# Patient Record
Sex: Male | Born: 1950
Health system: Southern US, Community
[De-identification: ages and names within clinical notes are randomized; demographics above are authoritative.]

## PROBLEM LIST (undated history)

## (undated) DIAGNOSIS — B029 Zoster without complications: Secondary | ICD-10-CM

## (undated) DIAGNOSIS — H269 Unspecified cataract: Secondary | ICD-10-CM

## (undated) DIAGNOSIS — I10 Essential (primary) hypertension: Secondary | ICD-10-CM

## (undated) DIAGNOSIS — E049 Nontoxic goiter, unspecified: Secondary | ICD-10-CM

## (undated) DIAGNOSIS — T7840XA Allergy, unspecified, initial encounter: Secondary | ICD-10-CM

## (undated) DIAGNOSIS — A0839 Other viral enteritis: Secondary | ICD-10-CM

## (undated) DIAGNOSIS — M810 Age-related osteoporosis without current pathological fracture: Secondary | ICD-10-CM

## (undated) DIAGNOSIS — J45909 Unspecified asthma, uncomplicated: Secondary | ICD-10-CM

## (undated) DIAGNOSIS — E785 Hyperlipidemia, unspecified: Secondary | ICD-10-CM

## (undated) DIAGNOSIS — Z860100 Personal history of colon polyps, unspecified: Secondary | ICD-10-CM

## (undated) DIAGNOSIS — M1990 Unspecified osteoarthritis, unspecified site: Secondary | ICD-10-CM

## (undated) DIAGNOSIS — K5190 Ulcerative colitis, unspecified, without complications: Secondary | ICD-10-CM

## (undated) DIAGNOSIS — F32A Depression, unspecified: Secondary | ICD-10-CM

## (undated) DIAGNOSIS — K219 Gastro-esophageal reflux disease without esophagitis: Secondary | ICD-10-CM

## (undated) DIAGNOSIS — B20 Human immunodeficiency virus [HIV] disease: Secondary | ICD-10-CM

## (undated) DIAGNOSIS — Z21 Asymptomatic human immunodeficiency virus [HIV] infection status: Secondary | ICD-10-CM

## (undated) DIAGNOSIS — K648 Other hemorrhoids: Secondary | ICD-10-CM

## (undated) DIAGNOSIS — G4730 Sleep apnea, unspecified: Secondary | ICD-10-CM

## (undated) DIAGNOSIS — F329 Major depressive disorder, single episode, unspecified: Secondary | ICD-10-CM

## (undated) DIAGNOSIS — K5790 Diverticulosis of intestine, part unspecified, without perforation or abscess without bleeding: Secondary | ICD-10-CM

## (undated) DIAGNOSIS — E559 Vitamin D deficiency, unspecified: Secondary | ICD-10-CM

## (undated) DIAGNOSIS — M8580 Other specified disorders of bone density and structure, unspecified site: Secondary | ICD-10-CM

## (undated) DIAGNOSIS — N529 Male erectile dysfunction, unspecified: Secondary | ICD-10-CM

## (undated) DIAGNOSIS — B259 Cytomegaloviral disease, unspecified: Secondary | ICD-10-CM

## (undated) DIAGNOSIS — F419 Anxiety disorder, unspecified: Secondary | ICD-10-CM

## (undated) HISTORY — DX: Depression, unspecified: F32.A

## (undated) HISTORY — DX: Asymptomatic human immunodeficiency virus (hiv) infection status: Z21

## (undated) HISTORY — DX: Unspecified asthma, uncomplicated: J45.909

## (undated) HISTORY — DX: Gastro-esophageal reflux disease without esophagitis: K21.9

## (undated) HISTORY — DX: Unspecified cataract: H26.9

## (undated) HISTORY — DX: Other viral enteritis: A08.39

## (undated) HISTORY — DX: Personal history of colonic polyps: Z86.010

## (undated) HISTORY — DX: Human immunodeficiency virus (HIV) disease: B20

## (undated) HISTORY — PX: TONSILLECTOMY AND ADENOIDECTOMY: SUR1326

## (undated) HISTORY — DX: Hyperlipidemia, unspecified: E78.5

## (undated) HISTORY — DX: Zoster without complications: B02.9

## (undated) HISTORY — PX: VASECTOMY: SHX75

## (undated) HISTORY — DX: Anxiety disorder, unspecified: F41.9

## (undated) HISTORY — PX: HEMORRHOID SURGERY: SHX153

## (undated) HISTORY — DX: Personal history of colon polyps, unspecified: Z86.0100

## (undated) HISTORY — DX: Unspecified osteoarthritis, unspecified site: M19.90

## (undated) HISTORY — DX: Diverticulosis of intestine, part unspecified, without perforation or abscess without bleeding: K57.90

## (undated) HISTORY — DX: Ulcerative colitis, unspecified, without complications: K51.90

## (undated) HISTORY — DX: Other specified disorders of bone density and structure, unspecified site: M85.80

## (undated) HISTORY — DX: Allergy, unspecified, initial encounter: T78.40XA

## (undated) HISTORY — DX: Essential (primary) hypertension: I10

## (undated) HISTORY — DX: Other hemorrhoids: K64.8

## (undated) HISTORY — DX: Cytomegaloviral disease, unspecified: B25.9

## (undated) HISTORY — PX: COLONOSCOPY: SHX174

## (undated) HISTORY — DX: Vitamin D deficiency, unspecified: E55.9

## (undated) HISTORY — DX: Sleep apnea, unspecified: G47.30

## (undated) HISTORY — DX: Male erectile dysfunction, unspecified: N52.9

## (undated) HISTORY — PX: UPPER GASTROINTESTINAL ENDOSCOPY: SHX188

## (undated) HISTORY — PX: MOUTH SURGERY: SHX715

## (undated) HISTORY — PX: CARPAL TUNNEL RELEASE: SHX101

---
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
## 2017-10-12 HISTORY — PX: CATARACT EXTRACTION, BILATERAL: SHX1313

## 2019-02-01 ENCOUNTER — Other Ambulatory Visit: Payer: Medicare Other

## 2019-02-01 ENCOUNTER — Other Ambulatory Visit (HOSPITAL_COMMUNITY)
Admission: RE | Admit: 2019-02-01 | Discharge: 2019-02-01 | Disposition: A | Discharge status: Home / Self Care | Payer: Medicare Other | Source: Ambulatory Visit | Attending: Family | Admitting: Family

## 2019-02-01 ENCOUNTER — Other Ambulatory Visit: Payer: Self-pay

## 2019-02-01 DIAGNOSIS — Z79899 Other long term (current) drug therapy: Secondary | ICD-10-CM

## 2019-02-01 DIAGNOSIS — Z113 Encounter for screening for infections with a predominantly sexual mode of transmission: Secondary | ICD-10-CM

## 2019-02-01 DIAGNOSIS — B20 Human immunodeficiency virus [HIV] disease: Secondary | ICD-10-CM

## 2019-02-02 LAB — URINALYSIS
Bilirubin Urine: NEGATIVE
Glucose, UA: NEGATIVE
Hgb urine dipstick: NEGATIVE
Ketones, ur: NEGATIVE
Leukocytes,Ua: NEGATIVE
Nitrite: NEGATIVE
Protein, ur: NEGATIVE
Specific Gravity, Urine: 1.018 (ref 1.001–1.03)
pH: 6 (ref 5.0–8.0)

## 2019-02-02 LAB — URINE CYTOLOGY ANCILLARY ONLY
Chlamydia: NEGATIVE
Comment: NEGATIVE
Comment: NORMAL
Neisseria Gonorrhea: NEGATIVE

## 2019-02-02 LAB — T-HELPER CELL (CD4) - (RCID CLINIC ONLY)
CD4 % Helper T Cell: 38 % (ref 33–65)
CD4 T Cell Abs: 1108 /uL (ref 400–1790)

## 2019-02-08 LAB — COMPLETE METABOLIC PANEL WITH GFR
AG Ratio: 2.1 (calc) (ref 1.0–2.5)
ALT: 28 U/L (ref 9–46)
AST: 19 U/L (ref 10–35)
Albumin: 4.4 g/dL (ref 3.6–5.1)
Alkaline phosphatase (APISO): 68 U/L (ref 35–144)
BUN/Creatinine Ratio: 10 (calc) (ref 6–22)
BUN: 13 mg/dL (ref 7–25)
CO2: 31 mmol/L (ref 20–32)
Calcium: 10.6 mg/dL — ABNORMAL HIGH (ref 8.6–10.3)
Chloride: 99 mmol/L (ref 98–110)
Creat: 1.26 mg/dL — ABNORMAL HIGH (ref 0.70–1.25)
GFR, Est African American: 68 mL/min/{1.73_m2} (ref >=60)
GFR, Est Non African American: 59 mL/min/{1.73_m2} — ABNORMAL LOW (ref >=60)
Globulin: 2.1 g/dL (calc) (ref 1.9–3.7)
Glucose, Bld: 129 mg/dL — ABNORMAL HIGH (ref 65–99)
Potassium: 5.1 mmol/L (ref 3.5–5.3)
Sodium: 138 mmol/L (ref 135–146)
Total Bilirubin: 0.4 mg/dL (ref 0.2–1.2)
Total Protein: 6.5 g/dL (ref 6.1–8.1)

## 2019-02-08 LAB — CBC WITH DIFFERENTIAL/PLATELET
Absolute Monocytes: 736 cells/uL (ref 200–950)
Basophils Absolute: 32 cells/uL (ref 0–200)
Basophils Relative: 0.4 %
Eosinophils Absolute: 64 cells/uL (ref 15–500)
Eosinophils Relative: 0.8 %
HCT: 45.2 % (ref 38.5–50.0)
Hemoglobin: 15.5 g/dL (ref 13.2–17.1)
Lymphs Abs: 2952 cells/uL (ref 850–3900)
MCH: 33 pg (ref 27.0–33.0)
MCHC: 34.3 g/dL (ref 32.0–36.0)
MCV: 96.2 fL (ref 80.0–100.0)
MPV: 10.5 fL (ref 7.5–12.5)
Monocytes Relative: 9.2 %
Neutro Abs: 4216 cells/uL (ref 1500–7800)
Neutrophils Relative %: 52.7 %
Platelets: 303 10*3/uL (ref 140–400)
RBC: 4.7 10*6/uL (ref 4.20–5.80)
RDW: 13 % (ref 11.0–15.0)
Total Lymphocyte: 36.9 %
WBC: 8 10*3/uL (ref 3.8–10.8)

## 2019-02-08 LAB — HIV-1 RNA ULTRAQUANT REFLEX TO GENTYP+
HIV 1 RNA Quant: <20 copies/mL
HIV-1 RNA Quant, Log: <1.3 Log copies/mL

## 2019-02-08 LAB — QUANTIFERON-TB GOLD PLUS
Mitogen-NIL: >10 IU/mL
NIL: 0.07 IU/mL
QuantiFERON-TB Gold Plus: NEGATIVE
TB1-NIL: 0 IU/mL
TB2-NIL: 0 IU/mL

## 2019-02-08 LAB — HEPATITIS B SURFACE ANTIGEN: Hepatitis B Surface Ag: NONREACTIVE

## 2019-02-08 LAB — RPR: RPR Ser Ql: REACTIVE — AB

## 2019-02-08 LAB — HEPATITIS C ANTIBODY
Hepatitis C Ab: NONREACTIVE
SIGNAL TO CUT-OFF: 0.01 (ref <1.00)

## 2019-02-08 LAB — HEPATITIS A ANTIBODY, TOTAL: Hepatitis A AB,Total: REACTIVE — AB

## 2019-02-08 LAB — HIV-1/2 AB - DIFFERENTIATION
HIV-1 antibody: POSITIVE — AB
HIV-2 Ab: UNDETERMINED — AB

## 2019-02-08 LAB — LIPID PANEL
Cholesterol: 246 mg/dL — ABNORMAL HIGH (ref <200)
HDL: 46 mg/dL (ref >=40)
Non-HDL Cholesterol (Calc): 200 mg/dL (calc) — ABNORMAL HIGH (ref <130)
Total CHOL/HDL Ratio: 5.3 (calc) — ABNORMAL HIGH (ref <5.0)
Triglycerides: 565 mg/dL — ABNORMAL HIGH (ref <150)

## 2019-02-08 LAB — RPR TITER: RPR Titer: 1:1 {titer} — ABNORMAL HIGH

## 2019-02-08 LAB — HEPATITIS B CORE ANTIBODY, TOTAL: Hep B Core Total Ab: NONREACTIVE

## 2019-02-08 LAB — HIV ANTIBODY (ROUTINE TESTING W REFLEX): HIV 1&2 Ab, 4th Generation: REACTIVE — AB

## 2019-02-08 LAB — FLUORESCENT TREPONEMAL AB(FTA)-IGG-BLD: Fluorescent Treponemal ABS: NONREACTIVE

## 2019-02-08 LAB — HLA B*5701: HLA-B*5701 w/rflx HLA-B High: NEGATIVE

## 2019-02-08 LAB — HEPATITIS B SURFACE ANTIBODY,QUALITATIVE: Hep B S Ab: REACTIVE — AB

## 2019-02-09 ENCOUNTER — Encounter: Payer: Self-pay | Admitting: Family

## 2019-02-15 NOTE — Progress Notes (Signed)
Patient seen with NP

## 2019-02-16 ENCOUNTER — Ambulatory Visit (INDEPENDENT_AMBULATORY_CARE_PROVIDER_SITE_OTHER): Payer: Medicare Other | Admitting: Pharmacist

## 2019-02-16 ENCOUNTER — Ambulatory Visit (INDEPENDENT_AMBULATORY_CARE_PROVIDER_SITE_OTHER): Payer: Medicare Other | Admitting: Family

## 2019-02-16 ENCOUNTER — Encounter: Payer: Self-pay | Admitting: Family

## 2019-02-16 ENCOUNTER — Telehealth: Payer: Self-pay

## 2019-02-16 ENCOUNTER — Other Ambulatory Visit: Payer: Self-pay

## 2019-02-16 VITALS — BP 138/92 | HR 75 | Wt 233.0 lb

## 2019-02-16 DIAGNOSIS — M8580 Other specified disorders of bone density and structure, unspecified site: Secondary | ICD-10-CM | POA: Diagnosis not present

## 2019-02-16 DIAGNOSIS — Z0000 Encounter for general adult medical examination without abnormal findings: Secondary | ICD-10-CM | POA: Diagnosis not present

## 2019-02-16 DIAGNOSIS — B20 Human immunodeficiency virus [HIV] disease: Secondary | ICD-10-CM | POA: Diagnosis not present

## 2019-02-16 DIAGNOSIS — K5190 Ulcerative colitis, unspecified, without complications: Secondary | ICD-10-CM

## 2019-02-16 DIAGNOSIS — Z113 Encounter for screening for infections with a predominantly sexual mode of transmission: Secondary | ICD-10-CM | POA: Diagnosis not present

## 2019-02-16 MED ORDER — FENOFIBRATE MICRONIZED 134 MG PO CAPS: 134.0000 mg | ORAL_CAPSULE | Freq: Every day | ORAL | 1 refills | Status: DC

## 2019-02-16 MED ORDER — GENVOYA 150-150-200-10 MG PO TABS: 1.0000 | ORAL_TABLET | Freq: Every day | ORAL | 5 refills | Status: DC

## 2019-02-16 MED ORDER — ESCITALOPRAM OXALATE 20 MG PO TABS: 20.0000 mg | ORAL_TABLET | Freq: Every day | ORAL | 1 refills | Status: DC

## 2019-02-16 MED ORDER — VITAMIN D (ERGOCALCIFEROL) 1.25 MG (50000 UNIT) PO CAPS: 50000.0000 [IU] | ORAL_CAPSULE | ORAL | 3 refills | Status: DC

## 2019-02-16 NOTE — Progress Notes (Signed)
Subjective:    Patient ID: Robert Park, male    DOB: [birth year], 69 y.o.   MRN: [id number]  Chief Complaint  Patient presents with  . New Patient (Initial Visit)    HPI:  Robert Park is a 69 y.o. male with previous medical history of HIV disease, osteopenia, anxiety, and shingles presenting today to transfer/establish care for HIV disease.   Robert Park is transferring care from his previous provider in [place], [place] where he was last seen on 07/28/2018 with good adherence and tolerance to his ART regimen of Genvoya with a viral load that was undetectable and CD4 count of 1334.  Believes his initial CD4 nadir was 450 with viral load of 21,000. Robert Park was initially diagnosed with HIV disease in February 2010 when he was started on a Atripla and then changed to [facility] in March 2016.  He has history of cytomegalovirus disease colitis and 2011 with no other opportunistic infections.  Initial clinic blood work completed on 02/01/2019 with confirmatory HIV-1 disease.  Viral load was undetectable and CD4 count was 1108.  He is serologically immune to hepatitis B and hepatitis A.  No acute infection with hepatitis C.  Gonorrhea, chlamydia, and syphilis were negative. HLAB-[id number] and QuantiFERON gold were negative.  Creatinine noted to be 1.26 with previous of 1.03.  Cholesterol panel with triglycerides of 565, HDL of 46, and LDL unable to be calculated due to triglyceride levels.  Robert Park continues to take his Genvoya as prescribed with no adverse side effects or missed doses since his last office visit.  Overall feeling well today with no new concerns/complaints.  He is needing to establish care with internal medicine and gastroenterology. Denies fevers, chills, night sweats, headaches, changes in vision, neck pain/stiffness, nausea, diarrhea, vomiting, lesions or rashes.  Robert Park has no problems obtaining his medication from the pharmacy and is covered through [place] healthcare Medicare and is  applying for SPAP.  Denies any feelings of being down, depressed, or hopeless.  He does use marijuana on occasion.  No current alcohol consumption or tobacco use.   Allergies  Allergen Reactions  . Prednisone   . Statins     Muscle pains, GI symptoms  . Sulfur       Outpatient Medications Prior to Visit  Medication Sig Dispense Refill  . Adalimumab (HUMIRA) 40 MG/0.4ML PSKT Inject into the skin.    Marland Kitchen ezetimibe (ZETIA) 10 MG tablet Take 10 mg by mouth daily.    Marland Kitchen losartan (COZAAR) 50 MG tablet Take 50 mg by mouth daily.    Marland Kitchen elvitegravir-cobicistat-emtricitabine-tenofovir (GENVOYA) 150-150-200-10 MG TABS tablet Take 1 tablet by mouth daily with breakfast.    . escitalopram (LEXAPRO) 20 MG tablet Take 20 mg by mouth daily.    . fenofibrate micronized (LOFIBRA) 134 MG capsule Take 134 mg by mouth daily before breakfast.     No facility-administered medications prior to visit.     Past Medical History:  Diagnosis Date  . Anxiety   . 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 HIV infection ([place])   . Hyperlipidemia   . Osteopenia   . Shingles     History reviewed. No pertinent surgical history.    History reviewed. No pertinent family history.    Social History   Socioeconomic History  . Marital status: Single    Spouse name: Not on file  . Number of children: Not on file  . Years of education: Not on file  . Highest education level: Not on  file  Occupational History  . Not on file  Tobacco Use  . Smoking status: Former Research scientist (life sciences)  . Smokeless tobacco: Never Used  . Tobacco comment: quit 32 years ago  Substance and Sexual Activity  . Alcohol use: Yes    Comment: every other day  . Drug use: Yes    Types: Marijuana    Comment: occassional usage  . Sexual activity: Not on file  Other Topics Concern  . Not on file  Social History Narrative  . Not on file   Social Determinants of Health   Financial Resource Strain:   . Difficulty of Paying Living Expenses: Not on file  Food  Insecurity:   . Worried About Charity fundraiser in the Last Year: Not on file  . Ran Out of Food in the Last Year: Not on file  Transportation Needs:   . Lack of Transportation (Medical): Not on file  . Lack of Transportation (Non-Medical): Not on file  Physical Activity:   . Days of Exercise per Week: Not on file  . Minutes of Exercise per Session: Not on file  Stress:   . Feeling of Stress : Not on file  Social Connections:   . Frequency of Communication with Friends and Family: Not on file  . Frequency of Social Gatherings with Friends and Family: Not on file  . Attends Religious Services: Not on file  . Active Member of Clubs or Organizations: Not on file  . Attends Archivist Meetings: Not on file  . Marital Status: Not on file  Intimate Partner Violence:   . Fear of Current or Ex-Partner: Not on file  . Emotionally Abused: Not on file  . Physically Abused: Not on file  . Sexually Abused: Not on file      Review of Systems  Constitutional: Negative for appetite change, chills, fatigue, fever and unexpected weight change.  Eyes: Negative for visual disturbance.  Respiratory: Negative for cough, chest tightness, shortness of breath and wheezing.   Cardiovascular: Negative for chest pain and leg swelling.  Gastrointestinal: Negative for abdominal pain, constipation, diarrhea, nausea and vomiting.  Genitourinary: Negative for dysuria, flank pain, frequency, genital sores, hematuria and urgency.  Skin: Negative for rash.  Allergic/Immunologic: Negative for immunocompromised state.  Neurological: Negative for dizziness and headaches.       Objective:    BP (!) 138/92   Pulse 75   Wt 233 lb (105.7 kg)  Nursing note and vital signs reviewed.  Physical Exam Constitutional:      General: He is not in acute distress.    Appearance: He is well-developed.  Eyes:     Conjunctiva/sclera: Conjunctivae normal.  Cardiovascular:     Rate and Rhythm: Normal rate and  regular rhythm.     Heart sounds: Normal heart sounds. No murmur. No friction rub. No gallop.   Pulmonary:     Effort: Pulmonary effort is normal. No respiratory distress.     Breath sounds: Normal breath sounds. No wheezing or rales.  Chest:     Chest wall: No tenderness.  Abdominal:     General: Bowel sounds are normal.     Palpations: Abdomen is soft.     Tenderness: There is no abdominal tenderness.  Musculoskeletal:     Cervical back: Neck supple.  Lymphadenopathy:     Cervical: No cervical adenopathy.  Skin:    General: Skin is warm and dry.     Findings: No rash.  Neurological:  Mental Status: He is alert and oriented to person, place, and time.  Psychiatric:        Behavior: Behavior normal.        Thought Content: Thought content normal.        Judgment: Judgment normal.         Assessment & Plan:   Patient Active Problem List   Diagnosis Date Noted  . Human immunodeficiency virus (HIV) disease ([place]) 02/16/2019  . Screening for STDs (sexually transmitted diseases) 02/16/2019  . Osteopenia 02/16/2019  . Healthcare maintenance 02/16/2019  . Ulcerative colitis without complications ([place]) 78/93/8101     Problem List Items Addressed This Visit      Digestive   Ulcerative colitis without complications ([place])    Currently maintained on Humira with no signs/symptoms of exacerbation at present.  Refer to gastroenterology for further management.      Relevant Orders   Ambulatory referral to Gastroenterology     Musculoskeletal and Integument   Osteopenia    Currently on vitamin D with no adverse side effects.  No recent fractures.  Check vitamin D levels with next blood work.  Continue current dose of vitamin D supplementation.      Relevant Orders   Vitamin D 1,25 dihydroxy     Other   Human immunodeficiency virus (HIV) disease ([place]) - Primary    Robert Park is a 69 y/o with HIV diagnosed in 2010 with initial CD4 nadir of 450 and viral load of 21,000. He  does have previous history of CMV colitis in 2010-11 with no other history of opportunistic infection.  Initially on a Atripla and switched to [place] in 2016.  We reviewed most recent lab work and discussed the plan of care with time for questions.  Continue current dose of Genvoya.  Plan for follow-up in 3 months or sooner if needed with lab work 1 to 2 weeks prior to appointment or on same day.      Relevant Medications   elvitegravir-cobicistat-emtricitabine-tenofovir (GENVOYA) 150-150-200-10 MG TABS tablet   Other Relevant Orders   T-helper cell (CD4)- ([facility] clinic only)   HIV-1 RNA quant-no reflex-bld   CBC   Comprehensive metabolic panel   Vitamin D 1,25 dihydroxy   Screening for STDs (sexually transmitted diseases)   Relevant Orders   RPR   Healthcare maintenance     Obtain vaccination records from previous provider.  Discussed importance of safe sexual practice to reduce risk of STI.          I have changed Robert Park's fenofibrate micronized and escitalopram. I am also having him start on Vitamin D (Ergocalciferol). Additionally, I am having him maintain his Humira, losartan, ezetimibe, and Genvoya.   Meds ordered this encounter  Medications  . elvitegravir-cobicistat-emtricitabine-tenofovir (GENVOYA) 150-150-200-10 MG TABS tablet    Sig: Take 1 tablet by mouth daily with breakfast.    Dispense:  30 tablet    Refill:  5    Order Specific Question:   Supervising Provider    Answer:   Carlyle Basques [[id number]]  . fenofibrate micronized (LOFIBRA) 134 MG capsule    Sig: Take 1 capsule (134 mg total) by mouth daily before breakfast.    Dispense:  30 capsule    Refill:  1    Order Specific Question:   Supervising Provider    Answer:   Carlyle Basques [[id number]]  . escitalopram (LEXAPRO) 20 MG tablet    Sig: Take 1 tablet (20 mg total) by mouth daily.  Dispense:  30 tablet    Refill:  1    Order Specific Question:   Supervising Provider    Answer:   Carlyle Basques  [[id number]]  . Vitamin D, Ergocalciferol, (DRISDOL) 1.25 MG (50000 UNIT) CAPS capsule    Sig: Take 1 capsule (50,000 Units total) by mouth every 7 (seven) days.    Dispense:  5 capsule    Refill:  3    Order Specific Question:   Supervising Provider    Answer:   Carlyle Basques [[id number]]     Follow-up: Return in about 3 months (around 05/16/2019), or if symptoms worsen or fail to improve.    Terri Piedra, MSN, FNP-C Nurse Practitioner [facility] for Infectious Disease [place] number: [phone]

## 2019-02-16 NOTE — Assessment & Plan Note (Signed)
Currently on vitamin D with no adverse side effects.  No recent fractures.  Check vitamin D levels with next blood work.  Continue current dose of vitamin D supplementation.

## 2019-02-16 NOTE — Assessment & Plan Note (Signed)
   Obtain vaccination records from previous provider.  Discussed importance of safe sexual practice to reduce risk of STI.

## 2019-02-16 NOTE — Assessment & Plan Note (Signed)
Currently maintained on Humira with no signs/symptoms of exacerbation at present.  Refer to gastroenterology for further management.

## 2019-02-16 NOTE — Telephone Encounter (Signed)
[facility] [facility] for patient's immunization records which were not provided in patient's transfer paperwork. Left voicemail requesting a call back for patient information.   Amela Handley Lorita Officer, RN

## 2019-02-16 NOTE — Patient Instructions (Signed)
Nice to meet you.  Continue to take your Genvoya as prescribed daily.  Refills will be sent to the pharmacy.  Referrals will be sent to gastroenterology.   Plan for follow up in 3 months or sooner if needed with lab work 1-2 weeks prior to appointment.  Have a great day and stay safe!

## 2019-02-16 NOTE — Assessment & Plan Note (Addendum)
Mr. Witherington is a 69 y/o with HIV diagnosed in 2010 with initial CD4 nadir of 450 and viral load of 21,000. He does have previous history of CMV colitis in 2010-11 with no other history of opportunistic infection.  Initially on a Atripla and switched to [place] in 2016.  We reviewed most recent lab work and discussed the plan of care with time for questions.  Continue current dose of Genvoya.  Plan for follow-up in 3 months or sooner if needed with lab work 1 to 2 weeks prior to appointment or on same day.

## 2019-02-19 NOTE — Progress Notes (Signed)
Introduced myself to patient and the pharmacy services at the clinic.  He is doing well on Genvoya and has no issues or questions. His SPAP is approved for help with his copay. Gave him my card and told him to call me with any issues/questions/concerns.

## 2019-02-26 ENCOUNTER — Ambulatory Visit: Payer: Medicare Other | Attending: Internal Medicine

## 2019-02-26 DIAGNOSIS — Z23 Encounter for immunization: Secondary | ICD-10-CM | POA: Insufficient documentation

## 2019-02-26 NOTE — Progress Notes (Signed)
   Covid-19 Vaccination Clinic  Name:  Robert Park    MRN: [id number] DOB: 22-Nov-1950  02/26/2019  [name] was observed post Covid-19 immunization for 15 minutes without incidence. He was provided with Vaccine Information Sheet and instruction to access the V-Safe system.   [name] was instructed to call 911 with any severe reactions post vaccine: Marland Kitchen Difficulty breathing  . Swelling of your face and throat  . A fast heartbeat  . A bad rash all over your body  . Dizziness and weakness    Immunizations Administered    Name Date Dose VIS Date Route   Pfizer COVID-19 Vaccine 02/26/2019  8:49 AM 0.3 mL 12/23/2018 Intramuscular   Manufacturer: [place]   Lot: [id number]   [place]: [id number]

## 2019-03-20 ENCOUNTER — Ambulatory Visit: Payer: Medicare Other | Attending: Internal Medicine

## 2019-03-20 DIAGNOSIS — Z23 Encounter for immunization: Secondary | ICD-10-CM | POA: Insufficient documentation

## 2019-03-20 NOTE — Progress Notes (Signed)
   Covid-19 Vaccination Clinic  Name:  Granite Godman    MRN: [id number] DOB: November 12, 1950  03/20/2019  Mr. Daft was observed post Covid-19 immunization for 15 minutes without incident. He was provided with Vaccine Information Sheet and instruction to access the V-Safe system.   Mr. Deiss was instructed to call 911 with any severe reactions post vaccine: Marland Kitchen Difficulty breathing  . Swelling of face and throat  . A fast heartbeat  . A bad rash all over body  . Dizziness and weakness   Immunizations Administered    Name Date Dose VIS Date Route   Pfizer COVID-19 Vaccine 03/20/2019  5:21 PM 0.3 mL 12/23/2018 Intramuscular   Manufacturer: [place]   Lot: [id number]   [place]: [id number]

## 2019-04-16 ENCOUNTER — Other Ambulatory Visit: Payer: Self-pay | Admitting: Family

## 2019-04-18 NOTE — Telephone Encounter (Signed)
Patient is requesting refills. He states he has not been able to establish primary care.   Please advise.   Laverle Patter, RN

## 2019-04-20 NOTE — Telephone Encounter (Signed)
Patient called requesting refills on medications. He stated he is not able to get in to see a PCP until next month. He requested refills for Losartan, escitalopram, ezetimibe and his fenofibrate. He has about 1 week left.   Imanol Bihl Lorita Officer, RN

## 2019-04-21 ENCOUNTER — Other Ambulatory Visit: Payer: Self-pay | Admitting: Family

## 2019-04-21 ENCOUNTER — Other Ambulatory Visit: Payer: Self-pay | Admitting: *Deleted

## 2019-04-21 ENCOUNTER — Telehealth: Payer: Self-pay | Admitting: *Deleted

## 2019-04-21 DIAGNOSIS — F32A Depression, unspecified: Secondary | ICD-10-CM

## 2019-04-21 DIAGNOSIS — F329 Major depressive disorder, single episode, unspecified: Secondary | ICD-10-CM

## 2019-04-21 DIAGNOSIS — E785 Hyperlipidemia, unspecified: Secondary | ICD-10-CM

## 2019-04-21 MED ORDER — EZETIMIBE 10 MG PO TABS: 10.0000 mg | ORAL_TABLET | Freq: Every day | ORAL | 0 refills | Status: DC

## 2019-04-21 MED ORDER — ESCITALOPRAM OXALATE 20 MG PO TABS: 20.0000 mg | ORAL_TABLET | Freq: Every day | ORAL | 0 refills | Status: DC

## 2019-04-21 MED ORDER — FENOFIBRATE MICRONIZED 134 MG PO CAPS: 134.0000 mg | ORAL_CAPSULE | Freq: Every day | ORAL | 0 refills | Status: DC

## 2019-04-21 MED ORDER — LOSARTAN POTASSIUM 50 MG PO TABS: 50.0000 mg | ORAL_TABLET | Freq: Every day | ORAL | 0 refills | Status: DC

## 2019-04-21 NOTE — Telephone Encounter (Signed)
Zetia and losartan not previously prescribed by you, listed as historic only.  OK to send in as written?

## 2019-04-21 NOTE — Telephone Encounter (Signed)
Ok to refill 30 day supply until he gets to his PCP.

## 2019-04-21 NOTE — Telephone Encounter (Signed)
Patient called requesting refills on medications. He stated he is not able to get in to see a PCP until next month. He requested refills for Losartan, escitalopram, ezetimibe and his fenofibrate. He has about 1 week left.   Dorie Lorita Officer, RN

## 2019-04-21 NOTE — Telephone Encounter (Signed)
Yes please - and thank you!

## 2019-04-23 ENCOUNTER — Other Ambulatory Visit: Payer: Self-pay | Admitting: Family

## 2019-05-16 ENCOUNTER — Other Ambulatory Visit: Payer: Medicare Other

## 2019-05-16 ENCOUNTER — Other Ambulatory Visit: Payer: Self-pay

## 2019-05-16 DIAGNOSIS — M8580 Other specified disorders of bone density and structure, unspecified site: Secondary | ICD-10-CM

## 2019-05-16 DIAGNOSIS — B20 Human immunodeficiency virus [HIV] disease: Secondary | ICD-10-CM

## 2019-05-16 DIAGNOSIS — Z113 Encounter for screening for infections with a predominantly sexual mode of transmission: Secondary | ICD-10-CM

## 2019-05-17 LAB — T-HELPER CELL (CD4) - (RCID CLINIC ONLY)
CD4 % Helper T Cell: 34 % (ref 33–65)
CD4 T Cell Abs: 1174 /uL (ref 400–1790)

## 2019-05-18 ENCOUNTER — Other Ambulatory Visit: Payer: Self-pay | Admitting: Family

## 2019-05-18 DIAGNOSIS — F329 Major depressive disorder, single episode, unspecified: Secondary | ICD-10-CM

## 2019-05-18 DIAGNOSIS — F32A Depression, unspecified: Secondary | ICD-10-CM

## 2019-05-18 DIAGNOSIS — E785 Hyperlipidemia, unspecified: Secondary | ICD-10-CM

## 2019-05-19 LAB — COMPREHENSIVE METABOLIC PANEL
AG Ratio: 2.1 (calc) (ref 1.0–2.5)
ALT: 17 U/L (ref 9–46)
AST: 14 U/L (ref 10–35)
Albumin: 4.5 g/dL (ref 3.6–5.1)
Alkaline phosphatase (APISO): 61 U/L (ref 35–144)
BUN: 16 mg/dL (ref 7–25)
CO2: 27 mmol/L (ref 20–32)
Calcium: 10 mg/dL (ref 8.6–10.3)
Chloride: 102 mmol/L (ref 98–110)
Creat: 0.97 mg/dL (ref 0.70–1.25)
Globulin: 2.1 g/dL (calc) (ref 1.9–3.7)
Glucose, Bld: 133 mg/dL — ABNORMAL HIGH (ref 65–99)
Potassium: 4.7 mmol/L (ref 3.5–5.3)
Sodium: 136 mmol/L (ref 135–146)
Total Bilirubin: 0.5 mg/dL (ref 0.2–1.2)
Total Protein: 6.6 g/dL (ref 6.1–8.1)

## 2019-05-19 LAB — RPR: RPR Ser Ql: NONREACTIVE

## 2019-05-19 LAB — VITAMIN D 1,25 DIHYDROXY
Vitamin D 1, 25 (OH)2 Total: 48 pg/mL (ref 18–72)
Vitamin D2 1, 25 (OH)2: 48 pg/mL
Vitamin D3 1, 25 (OH)2: <8 pg/mL

## 2019-05-19 LAB — CBC
HCT: 46 % (ref 38.5–50.0)
Hemoglobin: 15.7 g/dL (ref 13.2–17.1)
MCH: 32.6 pg (ref 27.0–33.0)
MCHC: 34.1 g/dL (ref 32.0–36.0)
MCV: 95.6 fL (ref 80.0–100.0)
MPV: 10.7 fL (ref 7.5–12.5)
Platelets: 269 10*3/uL (ref 140–400)
RBC: 4.81 10*6/uL (ref 4.20–5.80)
RDW: 13.1 % (ref 11.0–15.0)
WBC: 8.4 10*3/uL (ref 3.8–10.8)

## 2019-05-19 LAB — HIV-1 RNA QUANT-NO REFLEX-BLD
HIV 1 RNA Quant: <20 copies/mL
HIV-1 RNA Quant, Log: <1.3 Log copies/mL

## 2019-05-25 ENCOUNTER — Encounter: Payer: Self-pay | Admitting: Family

## 2019-05-25 ENCOUNTER — Telehealth: Payer: Self-pay

## 2019-05-25 NOTE — Telephone Encounter (Signed)
Recv'd reocrds from [facility] forwarded 21 pages to attn:Jessica @ [facility] 5/13/21fbg

## 2019-05-29 ENCOUNTER — Encounter: Payer: Self-pay | Admitting: Gastroenterology

## 2019-06-01 ENCOUNTER — Ambulatory Visit: Payer: Medicare Other | Admitting: Family

## 2019-06-01 ENCOUNTER — Other Ambulatory Visit: Payer: Self-pay

## 2019-06-01 ENCOUNTER — Telehealth: Payer: Self-pay | Admitting: Gastroenterology

## 2019-06-01 ENCOUNTER — Encounter: Payer: Self-pay | Admitting: Family

## 2019-06-01 VITALS — BP 133/89 | HR 72 | Temp 98.6°F | Wt 229.0 lb

## 2019-06-01 DIAGNOSIS — Z0000 Encounter for general adult medical examination without abnormal findings: Secondary | ICD-10-CM | POA: Diagnosis not present

## 2019-06-01 DIAGNOSIS — M8580 Other specified disorders of bone density and structure, unspecified site: Secondary | ICD-10-CM

## 2019-06-01 DIAGNOSIS — B20 Human immunodeficiency virus [HIV] disease: Secondary | ICD-10-CM | POA: Diagnosis not present

## 2019-06-01 MED ORDER — TADALAFIL 10 MG PO TABS
5.0000 mg | ORAL_TABLET | Freq: Every day | ORAL | 0 refills | Status: DC | PRN
Start: 2019-06-01 — End: 2019-09-01

## 2019-06-01 MED ORDER — GENVOYA 150-150-200-10 MG PO TABS: 1.0000 | ORAL_TABLET | Freq: Every day | ORAL | 5 refills | Status: DC

## 2019-06-01 NOTE — Telephone Encounter (Signed)
Hi Dr. Loletha Carrow,    D.O.D  We received a referral from PCP from patient to have a repeat colonoscopy. Pt stated that he has GI hx in [place] and has to have a colonoscopy every other year.  Pt reported that he has HIV and UC for the past 10 years.  Medical records requested and have been received will be sending them to you for review.     Please advise on scheduling. Thank you

## 2019-06-01 NOTE — Assessment & Plan Note (Signed)
Robert Park continues to have well-controlled HIV disease with good adherence and tolerance to his ART regimen of Genvoya.  No signs/symptoms of opportunistic infection or progressive HIV disease.  Reviewed his lab work and discussed the plan of care.  Continue current dose of Genvoya.  Plan for follow-up in 3 months or sooner if needed with lab work 1 to 2 weeks prior to appointment.

## 2019-06-01 NOTE — Assessment & Plan Note (Signed)
   Working to establish care with gastroenterology for colonoscopy.  Discussed importance of safe sexual practice to reduce risk of STI.  Obtain DEXA scan for osteopenia.

## 2019-06-01 NOTE — Telephone Encounter (Signed)
Please arrange a new patient office visit with me and I will review the records then. Thank you.  - HD

## 2019-06-01 NOTE — Progress Notes (Signed)
Subjective:    Patient ID: Robert Park, male    DOB: 02-27-1950, 69 y.o.   MRN: [id number]  Chief Complaint  Patient presents with  . Follow-up    B20     HPI:  Robert Park is a 69 y.o. male with HIV disease last seen in the office on 02/16/2019 with good adherence and tolerance to his ART regimen of Genvoya.  Viral load at the time was undetectable with CD4 count of 1108.  Most recent blood work completed on 05/16/2019 with a viral load that remains undetectable and CD4 count of 1174.  He is here today for routine follow-up.  Robert Park continues to take his Genvoya as prescribed with no adverse side effects or missed doses.  Overall feeling well today.  Working on establishing with gastroenterology for his ulcerative colitis as well as need for colonoscopy.  He is working on establishing with primary care and is in need of a DEXA scan for bone mineral density testing secondary to osteopenia. Denies fevers, chills, night sweats, headaches, changes in vision, neck pain/stiffness, nausea, diarrhea, vomiting, lesions or rashes.  Robert Park has no problems obtaining his medication from the pharmacy and remains covered [place] healthcare Medicare.  No feelings of being down, depressed, or hopeless recently.  Continues to use marijuana recreationally and drinks alcohol every other day.  Allergies  Allergen Reactions  . Prednisone   . Statins     Muscle pains, GI symptoms  . Sulfur       Outpatient Medications Prior to Visit  Medication Sig Dispense Refill  . Adalimumab (HUMIRA) 40 MG/0.4ML PSKT Inject into the skin.    Marland Kitchen escitalopram (LEXAPRO) 20 MG tablet TAKE 1 TABLET(20 MG) BY MOUTH DAILY 30 tablet 0  . ezetimibe (ZETIA) 10 MG tablet TAKE 1 TABLET(10 MG) BY MOUTH DAILY 30 tablet 0  . fenofibrate micronized (LOFIBRA) 134 MG capsule TAKE 1 CAPSULE(134 MG) BY MOUTH DAILY BEFORE BREAKFAST 30 capsule 0  . losartan (COZAAR) 50 MG tablet TAKE 1 TABLET(50 MG) BY MOUTH DAILY 30 tablet 0  .  Vitamin D, Ergocalciferol, (DRISDOL) 1.25 MG (50000 UNIT) CAPS capsule TAKE 1 CAPSULE BY MOUTH EVERY 7 DAYS 12 capsule 2  . elvitegravir-cobicistat-emtricitabine-tenofovir (GENVOYA) 150-150-200-10 MG TABS tablet Take 1 tablet by mouth daily with breakfast. 30 tablet 5   No facility-administered medications prior to visit.     Past Medical History:  Diagnosis Date  . Anxiety   . 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 HIV infection ([place])   . Hyperlipidemia   . Osteopenia   . Shingles      History reviewed. No pertinent surgical history.   Review of Systems  Constitutional: Negative for appetite change, chills, fatigue, fever and unexpected weight change.  Eyes: Negative for visual disturbance.  Respiratory: Negative for cough, chest tightness, shortness of breath and wheezing.   Cardiovascular: Negative for chest pain and leg swelling.  Gastrointestinal: Negative for abdominal pain, constipation, diarrhea, nausea and vomiting.  Genitourinary: Negative for dysuria, flank pain, frequency, genital sores, hematuria and urgency.  Skin: Negative for rash.  Allergic/Immunologic: Negative for immunocompromised state.  Neurological: Negative for dizziness and headaches.      Objective:    BP 133/89   Pulse 72   Temp 98.6 F (37 C)   Wt 229 lb (103.9 kg)   SpO2 94%  Nursing note and vital signs reviewed.  Physical Exam Constitutional:      General: He is not in acute distress.  Appearance: He is well-developed.  Eyes:     Conjunctiva/sclera: Conjunctivae normal.  Cardiovascular:     Rate and Rhythm: Normal rate and regular rhythm.     Heart sounds: Normal heart sounds. No murmur. No friction rub. No gallop.   Pulmonary:     Effort: Pulmonary effort is normal. No respiratory distress.     Breath sounds: Normal breath sounds. No wheezing or rales.  Chest:     Chest wall: No tenderness.  Abdominal:     General: Bowel sounds are normal.     Palpations: Abdomen is soft.     Tenderness:  There is no abdominal tenderness.  Musculoskeletal:     Cervical back: Neck supple.  Lymphadenopathy:     Cervical: No cervical adenopathy.  Skin:    General: Skin is warm and dry.     Findings: No rash.  Neurological:     Mental Status: He is alert and oriented to person, place, and time.  Psychiatric:        Behavior: Behavior normal.        Thought Content: Thought content normal.        Judgment: Judgment normal.      Depression screen PHQ 2/9 02/16/2019  Decreased Interest 0  Down, Depressed, Hopeless 0  PHQ - 2 Score 0       Assessment & Plan:    Patient Active Problem List   Diagnosis Date Noted  . Human immunodeficiency virus (HIV) disease ([place]) 02/16/2019  . Screening for STDs (sexually transmitted diseases) 02/16/2019  . Osteopenia 02/16/2019  . Healthcare maintenance 02/16/2019  . Ulcerative colitis without complications ([place]) 03/26/9456     Problem List Items Addressed This Visit      Musculoskeletal and Integument   Osteopenia   Relevant Orders   DG DXA FRACTURE ASSESSMENT     Other   Human immunodeficiency virus (HIV) disease ([place])    Robert Park continues to have well-controlled HIV disease with good adherence and tolerance to his ART regimen of Genvoya.  No signs/symptoms of opportunistic infection or progressive HIV disease.  Reviewed his lab work and discussed the plan of care.  Continue current dose of Genvoya.  Plan for follow-up in 3 months or sooner if needed with lab work 1 to 2 weeks prior to appointment.      Relevant Medications   elvitegravir-cobicistat-emtricitabine-tenofovir (GENVOYA) 150-150-200-10 MG TABS tablet   Other Relevant Orders   HIV-1 RNA quant-no reflex-bld   T-helper cell (CD4)- ([facility] clinic only)   Comprehensive metabolic panel   Healthcare maintenance     Working to establish care with gastroenterology for colonoscopy.  Discussed importance of safe sexual practice to reduce risk of STI.  Obtain DEXA scan for  osteopenia.       Other Visit Diagnoses    At risk for colon cancer    -  Primary       I am having Robert Park start on tadalafil. I am also having him maintain his Humira, fenofibrate micronized, ezetimibe, losartan, escitalopram, Vitamin D (Ergocalciferol), and Genvoya.   Meds ordered this encounter  Medications  . elvitegravir-cobicistat-emtricitabine-tenofovir (GENVOYA) 150-150-200-10 MG TABS tablet    Sig: Take 1 tablet by mouth daily with breakfast.    Dispense:  30 tablet    Refill:  5    Order Specific Question:   Supervising Provider    Answer:   Carlyle Basques [[id number]]  . tadalafil (CIALIS) 10 MG tablet    Sig: Take 0.5 tablets (5  mg total) by mouth daily as needed for erectile dysfunction.    Dispense:  10 tablet    Refill:  0    Order Specific Question:   Supervising Provider    Answer:   Carlyle Basques [[id number]]     Follow-up: Return in about 3 months (around 09/01/2019), or if symptoms worsen or fail to improve.   Terri Piedra, MSN, FNP-C Nurse Practitioner [facility] for Infectious Disease [place] number: [phone]

## 2019-06-01 NOTE — Patient Instructions (Signed)
Nice to see you.  We will continue your current dose of Genvoya.  Refills will be sent to the pharmacy.  A referral has been sent for your DEXA scan for bone mineral density testing.  Continue to work with getting records with GI.  We will plan to follow up in 3 months or sooner if needed with lab work 1-2 weeks prior to your appointment.  Renew SPAP after July 1st.  Have a great day and stay safe!

## 2019-06-05 NOTE — Telephone Encounter (Signed)
Consult scheduled for 5/25 at 3:00pm.

## 2019-06-06 ENCOUNTER — Ambulatory Visit: Payer: Medicare Other | Admitting: Gastroenterology

## 2019-06-06 VITALS — BP 124/80 | HR 76 | Ht 67.0 in | Wt 229.5 lb

## 2019-06-06 DIAGNOSIS — Z79899 Other long term (current) drug therapy: Secondary | ICD-10-CM

## 2019-06-06 DIAGNOSIS — K5130 Ulcerative (chronic) rectosigmoiditis without complications: Secondary | ICD-10-CM | POA: Diagnosis not present

## 2019-06-06 DIAGNOSIS — K635 Polyp of colon: Secondary | ICD-10-CM | POA: Insufficient documentation

## 2019-06-06 NOTE — Progress Notes (Addendum)
[facility] Consult Note:  History: Robert Park 06/06/2019  Referring provider: Terri Piedra, FNP  Reason for consult/chief complaint: Ulcerative Colitis (taking Humira, )   Subjective  HPI: We received a referral from this patient's primary care provider requesting a colonoscopy in the setting of history of ulcerative colitis.  Office visit was scheduled to evaluate patient and understand history and treatment of his colitis.  Office visit by Dr. Birdie Hopes on 07/16/2017 indicates ulcerative colitis diagnosed in 2009.  At the time of 2019 visit, patient reportedly feeling well on Humira 40 mg every other week.  At that point, reportedly up-to-date on flu, pneumococcal and meningitis vaccine.  Shingrix vaccine was recommended.  Colonoscopy June 2018 2 diminutive cecal polyps removed.  Sigmoid diverticulosis.  Mild erythematous and scarred mucosa in the rectum and distal sigmoid, additional biopsies taken remainder of colon.  Terminal ileum reportedly normal. Pathology showed polypoid low-grade dysplastic lesion (? Adenoma).  Biopsies around the polyp showed focal active inflammation with no dysplasia.  Biopsies from the ascending, transverse and descending were normal.  Biopsies from rectum and sigmoid colon with mild chronic colitis and focal activity. January 2019 creatinine 1.27, celiac serology negative. April 2019 creatinine 1.17, LFTs normal, hemoglobin 15.7, WBC 8, platelets 306.  June 2018 no indicates patient mainly had rectosigmoid colitis initially on Lialda, started Humira approximately April 2016.  Mother reportedly had colon cancer in her 72s.  _____________________________________  From 06/01/19 ID clinic note: "Robert Park is a 69 y.o. male with HIV disease last seen in the office on 02/16/2019 with good adherence and tolerance to his ART regimen of Genvoya.  Viral load at the time was undetectable with CD4 count of 1108.  Most recent blood work  completed on 05/16/2019 with a viral load that remains undetectable and CD4 count of 1174.  He is here today for routine follow-up. " ID provider ordered DXA scan.  ________________________________  Robert Park reports that his colitis has been under good control for years.  He denies chronic abdominal pain, diarrhea or rectal bleeding.  His appetite is good and weight stable.  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  He was due for a surveillance colonoscopy about a year ago but was unable to do so with a moved to this area and Covid.  He received care for several years in [place] and then more recently in [place], which are the records available to [place] and noted above. He denies painful eye redness, skin rash, red swollen joints or injection site reactions from Humira.  ROS:  Review of Systems  Constitutional: Negative for appetite change and unexpected weight change.  HENT: Negative for mouth sores and voice change.   Eyes: Negative for pain and redness.  Respiratory: Negative for cough and shortness of breath.   Cardiovascular: Negative for chest pain and palpitations.  Genitourinary: Negative for dysuria and hematuria.  Musculoskeletal: Negative for arthralgias and myalgias.  Skin: Negative for pallor and rash.  Neurological: Negative for weakness and headaches.  Hematological: Negative for adenopathy.  Psychiatric/Behavioral:       Mood stable     Past Medical History: Past Medical History:  Diagnosis Date  . Anxiety   . 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 Depression   . Diverticulosis   . Dyslipidemia   . ED (erectile dysfunction)   . History  of colon polyps   . HIV infection ([place])   . Hyperlipidemia   . Hypertension   . Internal hemorrhoids   . Nontoxic goiter   . Osteopenia   . Osteoporosis   . Shingles   . Ulcerative colitis ([place])    . Vitamin D deficiency      Past Surgical History: Past Surgical History:  Procedure Laterality Date  . CARPAL TUNNEL RELEASE Right    x 2  . CARPAL TUNNEL RELEASE Left    x 2  . CATARACT EXTRACTION, BILATERAL Bilateral 10/2017  . COLONOSCOPY    . HEMORRHOID SURGERY    . MOUTH SURGERY    . TONSILLECTOMY AND ADENOIDECTOMY     age 71  . VASECTOMY       Family History: Family History  Problem Relation Age of Onset  . Colon cancer Mother 68  . Heart failure Mother   . Cervical cancer Mother   . Leukemia Mother   . Diabetes Mother   . Lung cancer Father   . Diabetes Sister   . Diverticulitis Brother   . Diabetes Maternal Grandmother   . Diabetes Brother   . Hypertension Brother   . Hypertension Sister   . Post-traumatic stress disorder Son   . Hypertension Son   . Hyperlipidemia Son   . Anxiety disorder Son     Social History: Social History   Socioeconomic History  . Marital status: Single    Spouse name: Not on file  . Number of children: 2  . Years of education: Not on file  . Highest education level: Not on file  Occupational History  . Occupation: retired Financial planner  . Occupation: diabled  Tobacco Use  . Smoking status: Former Smoker    Types: Cigarettes    Quit date: 06/06/1987    Years since quitting: 32.0  . Smokeless tobacco: Never Used  . Tobacco comment: quit 32 years ago  Substance and Sexual Activity  . Alcohol use: Yes    Comment: every other day -social  . Drug use: Yes    Types: Marijuana    Comment: occassional usage  . Sexual activity: Not on file  Other Topics Concern  . Not on file  Social History Narrative  . Not on file   Social Determinants of Health   Financial Resource Strain:   . Difficulty of Paying Living Expenses:   Food Insecurity:   . Worried About Charity fundraiser in the Last Year:   . Arboriculturist in the Last Year:   Transportation Needs:   . Film/video editor (Medical):   Marland Kitchen Lack of  Transportation (Non-Medical):   Physical Activity:   . Days of Exercise per Week:   . Minutes of Exercise per Session:   Stress:   . Feeling of Stress :   Social Connections:   . Frequency of Communication with Friends and Family:   . Frequency of Social Gatherings with Friends and Family:   . Attends Religious Services:   . Active Member of Clubs or Organizations:   . Attends Archivist Meetings:   Marland Kitchen Marital Status:     Allergies: Allergies  Allergen Reactions  . Prednisone   . Statins     Muscle pains, GI symptoms  . Sulfur     Outpatient Meds: Current Outpatient Medications  Medication Sig Dispense Refill  . Adalimumab (HUMIRA) 40 MG/0.4ML PSKT Inject into the skin.    Marland Kitchen elvitegravir-cobicistat-emtricitabine-tenofovir (GENVOYA) 150-150-200-10 MG TABS  tablet Take 1 tablet by mouth daily with breakfast. 30 tablet 5  . escitalopram (LEXAPRO) 20 MG tablet TAKE 1 TABLET(20 MG) BY MOUTH DAILY 30 tablet 0  . ezetimibe (ZETIA) 10 MG tablet TAKE 1 TABLET(10 MG) BY MOUTH DAILY 30 tablet 0  . fenofibrate micronized (LOFIBRA) 134 MG capsule TAKE 1 CAPSULE(134 MG) BY MOUTH DAILY BEFORE BREAKFAST 30 capsule 0  . losartan (COZAAR) 50 MG tablet TAKE 1 TABLET(50 MG) BY MOUTH DAILY 30 tablet 0  . tadalafil (CIALIS) 10 MG tablet Take 0.5 tablets (5 mg total) by mouth daily as needed for erectile dysfunction. 10 tablet 0  . Vitamin D, Ergocalciferol, (DRISDOL) 1.25 MG (50000 UNIT) CAPS capsule TAKE 1 CAPSULE BY MOUTH EVERY 7 DAYS 12 capsule 2   No current facility-administered medications for this visit.      ___________________________________________________________________ Objective   Exam:  BP 124/80 (BP Location: Left Arm, Patient Position: Sitting, Cuff Size: Normal)   Pulse 76   Ht 5' 7"  (1.702 m) Comment: height measured without shoes  Wt 229 lb 8 oz (104.1 kg)   BMI 35.94 kg/m    General: Well-appearing pleasant and conversational  Eyes: sclera anicteric, no  redness  ENT: oral mucosa moist without lesions, no cervical or supraclavicular lymphadenopathy  CV: RRR without murmur, S1/S2, no JVD, no peripheral edema  Resp: clear to auscultation bilaterally, normal RR and effort noted  GI: soft, no tenderness, with active bowel sounds. No guarding or palpable organomegaly noted.  Skin; warm and dry, no rash or jaundice noted  Neuro: awake, alert and oriented x 3. Normal gross motor function and fluent speech  Labs:  negative TB quant gold 02/01/19  CBC Latest Ref Rng & Units 05/16/2019 02/01/2019  WBC 3.8 - 10.8 Thousand/uL 8.4 8.0  Hemoglobin 13.2 - 17.1 g/dL 15.7 15.5  Hematocrit 38.5 - 50.0 % 46.0 45.2  Platelets 140 - 400 Thousand/uL 269 303   CMP Latest Ref Rng & Units 05/16/2019 02/01/2019  Glucose 65 - 99 mg/dL 133(H) 129(H)  BUN 7 - 25 mg/dL 16 13  Creatinine 0.70 - 1.25 mg/dL 0.97 1.26(H)  Sodium 135 - 146 mmol/L 136 138  Potassium 3.5 - 5.3 mmol/L 4.7 5.1  Chloride 98 - 110 mmol/L 102 99  CO2 20 - 32 mmol/L 27 31  Calcium 8.6 - 10.3 mg/dL 10.0 10.6(H)  Total Protein 6.1 - 8.1 g/dL 6.6 6.5  Total Bilirubin 0.2 - 1.2 mg/dL 0.5 0.4  AST 10 - 35 U/L 14 19  ALT 9 - 46 U/L 17 28   Vitamin D low    Assessment: Encounter Diagnoses  Name Primary?  . 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 Long term current use of immunosuppressive drug     Longstanding ulcerative colitis.  Most recent GI notes in [place] indicate that it may have been primarily proctosigmoiditis, but that is not known for certain from the available records.  If it is been that limited disease, he would not yet have been in need of colonoscopy every 1 to 2 years.  I think we must assume it could have been pancolitis at some point and he should have surveillance colonoscopy as such since he was diagnosed in 2009.  So far tolerating the Humira well without apparent injection site reactions or side effects.  Control of condition sounds very good.  He is receiving health maintenance in the way of testing for osteoporosis.  His vitamin D is still low and requires adjustment by prescribing provider.  Based on available records, he appears to be mostly up-to-date on required vaccinations because of his immune suppressive therapy.  He received the 23 valent pneumococcal vaccine in the past, but needs the 13 Valent.  If we have that available today we will give it to him.  If not, I will inquire with the infectious disease provider about that. Patient's GI provider in [place] indicated they were planning to give him a Shingrix vaccine, but Robert Park says he does not believe he received that since the older type in 2013. We will also ring this to infectious disease attention and see if they may be able to provide that.  Patient was scheduled for a surveillance colonoscopy with me, random biopsies will be taken throughout the colon.  Also particular attention to the cecum since there was an unclear finding of "low-grade dysplastic lesion", which could be adenoma or perhaps related to the previously active colitis.  Plan:  He was agreeable to a colonoscopy after discussion of the risks and benefits.  The benefits and risks of the planned procedure were described in detail with the patient or (when appropriate) their health care proxy.  Risks were outlined as including, but not limited to, bleeding, infection, perforation, adverse medication reaction leading to cardiac or pulmonary decompensation, pancreatitis (if ERCP).  The limitation of incomplete mucosal visualization was also discussed.  No guarantees or warranties were given.   Thank you for the courtesy of this consult.  Please call me with any questions or concerns.  Nelida Meuse III  CC: Referring provider noted above  Record review addendum 06/15/2019  Immunization records from patient's prior GI practice in [place]  Flu vaccine December 2016, October 2017, September 2018  Meningococcus September 2018 and November 2018 Pneumovax 05 May 2014 TDAP February 2016. (No shingles vaccine recorded with these records-I recently messaged the patient's infectious disease consultant, and they were agreeable to attending to that.) This immunization record will be scanned to the patient's chart  H. Loletha Carrow, MD

## 2019-06-06 NOTE — Patient Instructions (Addendum)
If you are age 68 or older, your body mass index should be between 23-30. Your Body mass index is 35.94 kg/m. If this is out of the aforementioned range listed, please consider follow up with your Primary Care Provider.  If you are age 29 or younger, your body mass index should be between 19-25. Your Body mass index is 35.94 kg/m. If this is out of the aformentioned range listed, please consider follow up with your Primary Care Provider.    You have been scheduled for a colonoscopy. Please follow written instructions given to you at your visit today.  Please pick up your prep supplies at the pharmacy within the next 1-3 days. If you use inhalers (even only as needed), please bring them with you on the day of your procedure.  It was a pleasure to see you today!  Dr. Loletha Carrow

## 2019-06-07 NOTE — Progress Notes (Signed)
Called [facility] pt assistance and provided new verbal order for Humira. Dina Rich will contact pt to set up shipment of medication.

## 2019-06-15 ENCOUNTER — Encounter: Payer: Self-pay | Admitting: Gastroenterology

## 2019-06-18 ENCOUNTER — Other Ambulatory Visit: Payer: Self-pay | Admitting: Family

## 2019-06-18 DIAGNOSIS — F32A Depression, unspecified: Secondary | ICD-10-CM

## 2019-06-18 DIAGNOSIS — E785 Hyperlipidemia, unspecified: Secondary | ICD-10-CM

## 2019-06-19 ENCOUNTER — Other Ambulatory Visit: Payer: Self-pay | Admitting: Family

## 2019-06-21 ENCOUNTER — Encounter: Payer: Self-pay | Admitting: Gastroenterology

## 2019-06-21 ENCOUNTER — Other Ambulatory Visit: Payer: Self-pay

## 2019-06-21 ENCOUNTER — Ambulatory Visit (AMBULATORY_SURGERY_CENTER): Payer: Medicare Other | Admitting: Gastroenterology

## 2019-06-21 VITALS — BP 134/75 | HR 57 | Temp 97.3°F | Resp 19 | Ht 67.0 in | Wt 229.0 lb

## 2019-06-21 DIAGNOSIS — K529 Noninfective gastroenteritis and colitis, unspecified: Secondary | ICD-10-CM

## 2019-06-21 DIAGNOSIS — K5130 Ulcerative (chronic) rectosigmoiditis without complications: Secondary | ICD-10-CM | POA: Diagnosis not present

## 2019-06-21 DIAGNOSIS — K5730 Diverticulosis of large intestine without perforation or abscess without bleeding: Secondary | ICD-10-CM | POA: Diagnosis not present

## 2019-06-21 DIAGNOSIS — K635 Polyp of colon: Secondary | ICD-10-CM | POA: Diagnosis not present

## 2019-06-21 DIAGNOSIS — D125 Benign neoplasm of sigmoid colon: Secondary | ICD-10-CM | POA: Insufficient documentation

## 2019-06-21 DIAGNOSIS — K6389 Other specified diseases of intestine: Secondary | ICD-10-CM

## 2019-06-21 MED ORDER — SODIUM CHLORIDE 0.9 % IV SOLN
500.0000 mL | Freq: Once | INTRAVENOUS | Status: DC
Start: 2019-06-21 — End: 2019-06-21

## 2019-06-21 NOTE — Progress Notes (Signed)
AR - Check-in CW- VS

## 2019-06-21 NOTE — Progress Notes (Signed)
A and O x3. Report to RN. Tolerated MAC anesthesia well.

## 2019-06-21 NOTE — Progress Notes (Signed)
Called to room to assist during endoscopic procedure.  Patient ID and intended procedure confirmed with present staff. Received instructions for my participation in the procedure from the performing physician.  

## 2019-06-21 NOTE — Patient Instructions (Signed)
Information on polyps and diverticulosis given to you today.  Await pathology results.  Resume previous diet and medications.  YOU HAD AN ENDOSCOPIC PROCEDURE TODAY AT [facility]:   Refer to the procedure report that was given to you for any specific questions about what was found during the examination.  If the procedure report does not answer your questions, please call your gastroenterologist to clarify.  If you requested that your care partner not be given the details of your procedure findings, then the procedure report has been included in a sealed envelope for you to review at your convenience later.  YOU SHOULD EXPECT: Some feelings of bloating in the abdomen. Passage of more gas than usual.  Walking can help get rid of the air that was put into your GI tract during the procedure and reduce the bloating. If you had a lower endoscopy (such as a colonoscopy or flexible sigmoidoscopy) you may notice spotting of blood in your stool or on the toilet paper. If you underwent a bowel prep for your procedure, you may not have a normal bowel movement for a few days.  Please Note:  You might notice some irritation and congestion in your nose or some drainage.  This is from the oxygen used during your procedure.  There is no need for concern and it should clear up in a day or so.  SYMPTOMS TO REPORT IMMEDIATELY:   Following lower endoscopy (colonoscopy or flexible sigmoidoscopy):  Excessive amounts of blood in the stool  Significant tenderness or worsening of abdominal pains  Swelling of the abdomen that is new, acute  Fever of 100F or higher   For urgent or emergent issues, a gastroenterologist can be reached at any hour by calling [phone]. Do not use MyChart messaging for urgent concerns.    DIET:  We do recommend a small meal at first, but then you may proceed to your regular diet.  Drink plenty of fluids but you should avoid alcoholic beverages for 24  hours.  ACTIVITY:  You should plan to take it easy for the rest of today and you should NOT DRIVE or use heavy machinery until tomorrow (because of the sedation medicines used during the test).    FOLLOW UP: Our staff will call the number listed on your records 48-72 hours following your procedure to check on you and address any questions or concerns that you may have regarding the information given to you following your procedure. If we do not reach you, we will leave a message.  We will attempt to reach you two times.  During this call, we will ask if you have developed any symptoms of COVID 19. If you develop any symptoms (ie: fever, flu-like symptoms, shortness of breath, cough etc.) before then, please call [phone].  If you test positive for Covid 19 in the 2 weeks post procedure, please call and report this information to [place].    If any biopsies were taken you will be contacted by phone or by letter within the next 1-3 weeks.  Please call us at [phone] if you have not heard about the biopsies in 3 weeks.    SIGNATURES/CONFIDENTIALITY: You and/or your care partner have signed paperwork which will be entered into your electronic medical record.  These signatures attest to the fact that that the information above on your After Visit Summary has been reviewed and is understood.  Full responsibility of the confidentiality of this discharge information lies with you and/or  your care-partner.

## 2019-06-21 NOTE — Op Note (Signed)
[name] Procedure Date: 06/21/2019 10:57 AM MRN: [id number] Endoscopist: Mallie Mussel L. Robert Park , MD Age: 69 Referring MD:  Date of Birth: [birth year] Gender: Male Account #: [id number] Procedure:                Colonoscopy Indications:              Disease activity assessment (and dysplasia                            screening) of chronic ulcerative pancolitis, later                            left-sided colitis. Excellent symptoms control on                            Humira. Medicines:                Monitored Anesthesia Care Procedure:                Pre-Anesthesia Assessment:                           - Prior to the procedure, a History and Physical                            was performed, and patient medications and                            allergies were reviewed. The patient's tolerance of                            previous anesthesia was also reviewed. The risks                            and benefits of the procedure and the sedation                            options and risks were discussed with the patient.                            All questions were answered, and informed consent                            was obtained. Prior Anticoagulants: The patient has                            taken no previous anticoagulant or antiplatelet                            agents. ASA Grade Assessment: II - A patient with                            mild systemic disease. After reviewing the risks  and benefits, the patient was deemed in                            satisfactory condition to undergo the procedure.                           After obtaining informed consent, the colonoscope                            was passed under direct vision. Throughout the                            procedure, the patient's blood pressure, pulse, and                            oxygen saturations were monitored continuously. The                   Colonoscope was introduced through the anus and                            advanced to the the cecum, identified by                            appendiceal orifice and ileocecal valve. The                            colonoscopy was performed with difficulty due to a                            redundant colon and significant looping. Successful                            completion of the procedure was aided by changing                            the patient to a prone position and using manual                            pressure. The patient tolerated the procedure well.                            The quality of the bowel preparation was good. The                            ileocecal valve, appendiceal orifice, and rectum                            were photographed. The bowel preparation used was                            Miralax. Scope In: 87:86:76 AM Scope Out: 11:34:00 AM Scope Withdrawal Time: 0 hours 19 minutes 0 seconds  Total Procedure Duration: 0 hours 27 minutes 51 seconds  Findings:  The perianal and digital rectal examinations were                            normal.                           Normal mucosa was found in the entire colon.                            Colitis was inactive. Four biopsies were taken                            every 10 cm with a cold forceps from the entire                            colon for ulcerative colitis surveillance. These                            biopsy specimens were sent to Pathology.                           A 4 mm polyp was found in the sigmoid colon. The                            polyp was sessile. The polyp was removed with a                            cold snare. Resection and retrieval were complete.                           Multiple diverticula were found in the left colon                            and right colon.                           The colon (entire examined portion) was                             significantly redundant.                           The exam was otherwise without abnormality on                            direct and retroflexion views. Complications:            No immediate complications. Estimated Blood Loss:     Estimated blood loss was minimal. Impression:               - Normal mucosa in the entire examined colon.                            Biopsied.                           -  One 4 mm polyp in the sigmoid colon, removed with                            a cold snare. Resected and retrieved.                           - Diverticulosis in the left colon and in the right                            colon.                           - Redundant colon.                           - The examination was otherwise normal on direct                            and retroflexion views. Recommendation:           - Patient has a contact number available for                            emergencies. The signs and symptoms of potential                            delayed complications were discussed with the                            patient. Return to normal activities tomorrow.                            Written discharge instructions were provided to the                            patient.                           - Resume previous diet.                           - Continue present medications.                           - Await pathology results.                           - Repeat colonoscopy is recommended for                            surveillance. The colonoscopy date will be                            determined after pathology results from today's                            exam become available for  review. (Suprep or Plenvu                            for next exam) [name] L. Robert Carrow, MD 06/21/2019 11:42:53 AM This report has been signed electronically.

## 2019-06-23 ENCOUNTER — Telehealth: Payer: Self-pay | Admitting: *Deleted

## 2019-06-23 ENCOUNTER — Telehealth: Payer: Self-pay

## 2019-06-23 NOTE — Telephone Encounter (Signed)
  Follow up Call-  Call back number 06/21/2019  Post procedure Call Back phone  # [phone] ce;;  Permission to leave phone message Yes     Patient questions:  Do you have a fever, pain , or abdominal swelling? No. Pain Score  0 *  Have you tolerated food without any problems? Yes.    Have you been able to return to your normal activities? Yes.    Do you have any questions about your discharge instructions: Diet   No. Medications  No. Follow up visit  No.  Do you have questions or concerns about your Care? No.  Actions: * If pain score is 4 or above: No action needed, pain <4.  1. Have you developed a fever since your procedure? no  2.   Have you had an respiratory symptoms (SOB or cough) since your procedure? no  3.   Have you tested positive for COVID 19 since your procedure no  4.   Have you had any family members/close contacts diagnosed with the COVID 19 since your procedure?  no   If yes to any of these questions please route to Joylene John, RN and Erenest Rasher, RN

## 2019-06-23 NOTE — Telephone Encounter (Signed)
Attempted f/u phone call. No answer. Left message. °

## 2019-06-28 ENCOUNTER — Encounter: Payer: Self-pay | Admitting: Gastroenterology

## 2019-07-10 ENCOUNTER — Other Ambulatory Visit: Payer: Self-pay | Admitting: Family

## 2019-07-10 DIAGNOSIS — M8580 Other specified disorders of bone density and structure, unspecified site: Secondary | ICD-10-CM

## 2019-07-20 ENCOUNTER — Other Ambulatory Visit: Payer: Self-pay | Admitting: Family

## 2019-07-20 DIAGNOSIS — E785 Hyperlipidemia, unspecified: Secondary | ICD-10-CM

## 2019-07-20 DIAGNOSIS — F32A Depression, unspecified: Secondary | ICD-10-CM

## 2019-07-24 ENCOUNTER — Encounter: Payer: Medicare Other | Admitting: Student in an Organized Health Care Education/Training Program

## 2019-08-07 ENCOUNTER — Ambulatory Visit: Payer: Medicare Other | Admitting: Student in an Organized Health Care Education/Training Program

## 2019-08-07 ENCOUNTER — Encounter: Payer: Self-pay | Admitting: Student in an Organized Health Care Education/Training Program

## 2019-08-07 VITALS — BP 132/80 | HR 65 | Temp 99.8°F | Ht 67.0 in | Wt 231.7 lb

## 2019-08-07 DIAGNOSIS — I10 Essential (primary) hypertension: Secondary | ICD-10-CM

## 2019-08-07 DIAGNOSIS — Z23 Encounter for immunization: Secondary | ICD-10-CM

## 2019-08-07 DIAGNOSIS — E782 Mixed hyperlipidemia: Secondary | ICD-10-CM

## 2019-08-07 DIAGNOSIS — E785 Hyperlipidemia, unspecified: Secondary | ICD-10-CM | POA: Insufficient documentation

## 2019-08-07 DIAGNOSIS — R7303 Prediabetes: Secondary | ICD-10-CM | POA: Insufficient documentation

## 2019-08-07 DIAGNOSIS — R739 Hyperglycemia, unspecified: Secondary | ICD-10-CM

## 2019-08-07 DIAGNOSIS — Z125 Encounter for screening for malignant neoplasm of prostate: Secondary | ICD-10-CM

## 2019-08-07 DIAGNOSIS — M19042 Primary osteoarthritis, left hand: Secondary | ICD-10-CM

## 2019-08-07 DIAGNOSIS — E119 Type 2 diabetes mellitus without complications: Secondary | ICD-10-CM | POA: Insufficient documentation

## 2019-08-07 DIAGNOSIS — F411 Generalized anxiety disorder: Secondary | ICD-10-CM | POA: Insufficient documentation

## 2019-08-07 DIAGNOSIS — Z0000 Encounter for general adult medical examination without abnormal findings: Secondary | ICD-10-CM

## 2019-08-07 LAB — GLUCOSE, CAPILLARY: Glucose-Capillary: 145 mg/dL — ABNORMAL HIGH (ref 70–99)

## 2019-08-07 LAB — POCT GLYCOSYLATED HEMOGLOBIN (HGB A1C): Hemoglobin A1C: 6.3 % — AB (ref 4.0–5.6)

## 2019-08-07 MED ORDER — ZOSTER VAC RECOMB ADJUVANTED 50 MCG/0.5ML IM SUSR: 0.5000 mL | Freq: Once | INTRAMUSCULAR | 1 refills | Status: AC

## 2019-08-07 MED ORDER — ASPIRIN EC 81 MG PO TBEC: 81.0000 mg | DELAYED_RELEASE_TABLET | Freq: Every day | ORAL | 2 refills | Status: AC

## 2019-08-07 NOTE — Assessment & Plan Note (Signed)
Significant hyperlipidemia with total cholesterol over 250, HDL 46.  10-year ASCVD risk around 25%, no history of ischemic event so far.  Intolerant to previous statins due to a mix of side effects including myalgias and rash.  Has an element of hypertriglyceridemia but no history of pancreatitis.  Plan to continue with primary prevention using ezetimibe 10 mg daily.  We discussed the risks and benefits of a daily low-dose aspirin, decided to go ahead with aspirin 81 mg daily to lower his risk of an ischemic event a little more.  He understands the low risk of bleeding.  I advised that fenofibrate is optional, hypertriglyceridemia likely playing little role in his overall cardiovascular risk, he decided to stop the fenofibrate for now to reduce pill burden which I think is appropriate.

## 2019-08-07 NOTE — Assessment & Plan Note (Signed)
History of steroid-induced hyperglycemia requiring insulin.  Blood sugar is a little elevated on recent lab work.  At risk for diabetes given moderate obesity and hypertension, as well as family history.  Plan to check hemoglobin A1c today.

## 2019-08-07 NOTE — Assessment & Plan Note (Signed)
Excellent work so far in maintaining his routine health maintenance.  Patient is relatively immunosuppressed due to Humira injections weekly.  We gave the patient Prevnar vaccine today and I gave him a prescription for the Shingrix vaccine series.  He is up-to-date on Covid vaccination, at least current recommendations.  We discussed prostate cancer screening, decided to check PSA today.

## 2019-08-07 NOTE — Assessment & Plan Note (Signed)
Mild generalized anxiety well controlled on Lexapro 20 mg daily for several years.  Only adverse side effect is possibly some erectile dysfunction related but this is well treated with as needed Cialis.  We will continue with Lexapro 20 mg daily.

## 2019-08-07 NOTE — Assessment & Plan Note (Signed)
Well-controlled hypertension, plan to continue with losartan 50 mg daily.  Renal function is normal 2 months ago.  Has had a history of orthostatic symptoms when on combination pill, so we will set a more liberal goal of systolic blood pressures under 140.

## 2019-08-07 NOTE — Progress Notes (Signed)
Assessment and Plan:  See Encounters tab for problem-based medical decision making.   __________________________________________________________  HPI:   69 year old person living with HIV and ulcerative colitis presents to clinic to establish care to manage hypertension and other chronic medical problems as outlined below.  He is in good state of health currently, no acute complaints.  We discussed several issues today, he is having increasing stiffness in his left hand related to a prior osteoarthritis.  He has well-controlled HIV since around 2009, currently being managed by Terri Piedra at the infectious disease clinic.  He has well-controlled ulcerative colitis being managed by Dr. Loletha Carrow with the GI clinic using Humira for the last 3 years, no hospitalizations and symptoms seem to be well controlled.  He previously lived in [place] for many years, moved to [place] in December and says will be living here indefinitely.  No recent fevers or chills.  Comprehensive review of systems is negative today.  Social: Prior smoker, quit over 30 years ago.  Current most day alcohol use, around 2-3 drinks per day.  Currently not employed but looking for work as a Presenter, broadcasting.  Previously worked in truck driving, Journalist, newspaper.  Lives with a housemate in [place].  Previously married, has 2 adult children that live out of state.  Does not exercise routinely right now.  Family history: 7 brothers and sisters, diabetes and 2 of them.  No history of cancer in close family.  Allergies as of 08/07/2019      Reactions   Prednisone    Statins    Muscle pains, GI symptoms   Sulfur       Medication List       Accurate as of August 07, 2019 10:11 AM. If you have any questions, ask your nurse or doctor.        STOP taking these medications   fenofibrate micronized 134 MG capsule Commonly known as: LOFIBRA Stopped by: Axel Filler, MD   Fish Oil 600 MG  Caps Stopped by: Axel Filler, MD     TAKE these medications   aspirin EC 81 MG tablet Take 1 tablet (81 mg total) by mouth daily. Swallow whole. Started by: Axel Filler, MD   Centrum Silver 50+Men Tabs Take by mouth. Three times per week   escitalopram 20 MG tablet Commonly known as: LEXAPRO TAKE 1 TABLET(20 MG) BY MOUTH DAILY   ezetimibe 10 MG tablet Commonly known as: ZETIA TAKE 1 TABLET(10 MG) BY MOUTH DAILY   Genvoya 150-150-200-10 MG Tabs tablet Generic drug: elvitegravir-cobicistat-emtricitabine-tenofovir Take 1 tablet by mouth daily with breakfast.   Humira 40 MG/0.4ML Pskt Generic drug: Adalimumab Inject into the skin.   losartan 50 MG tablet Commonly known as: COZAAR TAKE 1 TABLET(50 MG) BY MOUTH DAILY   tadalafil 10 MG tablet Commonly known as: Cialis Take 0.5 tablets (5 mg total) by mouth daily as needed for erectile dysfunction.   Vitamin D (Ergocalciferol) 1.25 MG (50000 UNIT) Caps capsule Commonly known as: DRISDOL TAKE 1 CAPSULE BY MOUTH EVERY 7 DAYS   Zoster Vaccine Adjuvanted injection Commonly known as: SHINGRIX Inject 0.5 mLs into the muscle once for 1 dose. Started by: Axel Filler, MD       __________________________________________________________  Problem List: Patient Active Problem List   Diagnosis Date Noted  . Hypertension 08/07/2019    Priority: High  . Hyperlipidemia 08/07/2019    Priority: High  . Ulcerative colitis without complications ([place]) 28/76/8115  Priority: High  . GAD (generalized anxiety disorder) 08/07/2019    Priority: Medium  . Localized osteoarthritis of left hand 08/07/2019    Priority: Medium  . Human immunodeficiency virus (HIV) disease ([place]) 02/16/2019    Priority: Medium  . Osteopenia 02/16/2019    Priority: Medium  . Colon polyps 06/06/2019    Priority: Low  . Screening for STDs (sexually transmitted diseases) 02/16/2019    Priority: Low  . Healthcare maintenance  02/16/2019    Priority: Low  . Hyperglycemia 08/07/2019    Medications: Reconciled today in Epic __________________________________________________________  Physical Exam:  Vital Signs: Vitals:   08/07/19 0958  BP: (!) 143/78  Pulse: 70  Temp: 99.8 F (37.7 C)  TempSrc: Oral  SpO2: 97%  Weight: (!) 231 lb 11.2 oz (105.1 kg)  Height: 5' 7"  (1.702 m)    Gen: Well appearing, NAD ENT: OP clear without erythema or exudate.  Neck: No cervical LAD, No thyromegaly or nodules, No JVD. CV: RRR, no murmurs Pulm: Normal effort, CTA throughout, no wheezing Abd: Soft, NT, ND. Gen: Normal uncircumcised penis, no lymphadenopathy, normal testicles with no masses Ext: Warm, no edema, normal joints Skin: No atypical appearing moles. No rashes Psych: Appropriate mood and affect, not depressed or anxious appearing Neuro: Alert, conversational, normal gait, full strength in the upper and lower extremities

## 2019-08-07 NOTE — Assessment & Plan Note (Signed)
Moderate stiffness and arthritis of the left hand which is somewhat function limiting.  History of steroid injections by prior orthopedist in 2015 in [place].  Given that it is becoming function limiting again will refer to local orthopedist for further management.

## 2019-08-08 LAB — PSA: Prostate Specific Ag, Serum: 0.8 ng/mL (ref 0.0–4.0)

## 2019-08-18 ENCOUNTER — Other Ambulatory Visit: Payer: Medicare Other

## 2019-08-18 ENCOUNTER — Other Ambulatory Visit: Payer: Self-pay

## 2019-08-18 DIAGNOSIS — B20 Human immunodeficiency virus [HIV] disease: Secondary | ICD-10-CM

## 2019-08-18 LAB — T-HELPER CELL (CD4) - (RCID CLINIC ONLY)
CD4 % Helper T Cell: 38 % (ref 33–65)
CD4 T Cell Abs: 1097 /uL (ref 400–1790)

## 2019-08-23 LAB — COMPREHENSIVE METABOLIC PANEL
AG Ratio: 2 (calc) (ref 1.0–2.5)
ALT: 21 U/L (ref 9–46)
AST: 17 U/L (ref 10–35)
Albumin: 4.6 g/dL (ref 3.6–5.1)
Alkaline phosphatase (APISO): 64 U/L (ref 35–144)
BUN: 13 mg/dL (ref 7–25)
CO2: 24 mmol/L (ref 20–32)
Calcium: 10.1 mg/dL (ref 8.6–10.3)
Chloride: 101 mmol/L (ref 98–110)
Creat: 1.08 mg/dL (ref 0.70–1.25)
Globulin: 2.3 g/dL (calc) (ref 1.9–3.7)
Glucose, Bld: 141 mg/dL — ABNORMAL HIGH (ref 65–99)
Potassium: 4.8 mmol/L (ref 3.5–5.3)
Sodium: 137 mmol/L (ref 135–146)
Total Bilirubin: 0.6 mg/dL (ref 0.2–1.2)
Total Protein: 6.9 g/dL (ref 6.1–8.1)

## 2019-08-23 LAB — HIV-1 RNA QUANT-NO REFLEX-BLD
HIV 1 RNA Quant: <20 Copies/mL
HIV-1 RNA Quant, Log: <1.3 Log cps/mL

## 2019-08-30 ENCOUNTER — Encounter: Payer: Self-pay | Admitting: Family

## 2019-08-31 ENCOUNTER — Telehealth: Payer: Self-pay

## 2019-08-31 NOTE — Telephone Encounter (Signed)
COVID-19 Pre-Screening Questions:08/31/19  Do you currently have a fever (>100 F), chills or unexplained body aches?NO  Are you currently experiencing new cough, shortness of breath, sore throat, runny nose? NO  .  Have you been in contact with someone that is currently pending confirmation of Covid19 testing or has been confirmed to have the [place] virus?  NO  **If the patient answers NO to ALL questions -  advise the patient to please call the clinic before coming to the office should any symptoms develop.

## 2019-09-01 ENCOUNTER — Encounter: Payer: Self-pay | Admitting: Family

## 2019-09-01 ENCOUNTER — Ambulatory Visit: Payer: Medicare Other | Admitting: Family

## 2019-09-01 ENCOUNTER — Other Ambulatory Visit: Payer: Self-pay

## 2019-09-01 VITALS — BP 131/85 | HR 66 | Temp 98.5°F | Wt 230.0 lb

## 2019-09-01 DIAGNOSIS — Z113 Encounter for screening for infections with a predominantly sexual mode of transmission: Secondary | ICD-10-CM | POA: Diagnosis not present

## 2019-09-01 DIAGNOSIS — Z79899 Other long term (current) drug therapy: Secondary | ICD-10-CM | POA: Diagnosis not present

## 2019-09-01 DIAGNOSIS — Z0000 Encounter for general adult medical examination without abnormal findings: Secondary | ICD-10-CM

## 2019-09-01 DIAGNOSIS — B20 Human immunodeficiency virus [HIV] disease: Secondary | ICD-10-CM

## 2019-09-01 MED ORDER — GENVOYA 150-150-200-10 MG PO TABS: 1.0000 | ORAL_TABLET | Freq: Every day | ORAL | 5 refills | Status: DC

## 2019-09-01 MED ORDER — TADALAFIL 10 MG PO TABS: 5.0000 mg | ORAL_TABLET | Freq: Every day | ORAL | 0 refills | Status: DC | PRN

## 2019-09-01 NOTE — Assessment & Plan Note (Signed)
   COVID vaccine up to date per recommendations. Booster not indicated at present although eligible.   Discussed importance of safe sexual practice to reduce risk of STI. Condoms declined.  Immunizations updated in primary care. Will need 1 additional dose of Pneumovax.

## 2019-09-01 NOTE — Progress Notes (Signed)
Subjective:    Patient ID: Robert Park, male    DOB: [birth year], 69 y.o.   MRN: [id number]  Chief Complaint  Patient presents with  . Follow-up    B20     HPI:  Robert Park is a 69 y.o. male with HIV disease who was initially diagnosed with HIV-1 in 2010 with initial CD4 count of 450 and viral load of 21,000 with risk factor for HIV being MSM who was last seen in the office on 06/01/2019 with good adherence and tolerance to his ART regimen of Genvoya.  Viral load at the time was found to be undetectable with CD4 count of 1174.  Recent blood work completed on 08/18/2019 with a viral load that remains undetectable and CD4 count of 1097.  Kidney function, liver function, and electrolytes within normal ranges.  Here today for routine follow-up.  Robert Park continues to take his Genvoya daily as prescribed with no adverse side effects or missed doses. Overall feeling well today. Did receive two cortisone injections in his left wrist and thumb. .Denies fevers, chills, night sweats, headaches, changes in vision, neck pain/stiffness, nausea, diarrhea, vomiting, lesions or rashes.  Robert Park has no problems obtaining medications from the pharmacy and remains covered through [employer]. Denies feelings of being down, depressed or hopeless recently. No recent recreational or illicit drug use, tobacco use and occasional alcohol consumption. Declines condoms today. COVID vaccine up to date per recommendations.    Allergies  Allergen Reactions  . Prednisone   . Statins     Muscle pains, GI symptoms  . Sulfur       Outpatient Medications Prior to Visit  Medication Sig Dispense Refill  . Adalimumab (HUMIRA) 40 MG/0.4ML PSKT Inject into the skin.    Marland Kitchen aspirin EC 81 MG tablet Take 1 tablet (81 mg total) by mouth daily. Swallow whole. 150 tablet 2  . escitalopram (LEXAPRO) 20 MG tablet TAKE 1 TABLET(20 MG) BY MOUTH DAILY 30 tablet 2  . ezetimibe (ZETIA) 10 MG tablet TAKE 1 TABLET(10  MG) BY MOUTH DAILY 30 tablet 2  . losartan (COZAAR) 50 MG tablet TAKE 1 TABLET(50 MG) BY MOUTH DAILY 30 tablet 2  . Multiple Vitamins-Minerals (CENTRUM SILVER 50+MEN) TABS Take by mouth. Three times per week    . Vitamin D, Ergocalciferol, (DRISDOL) 1.25 MG (50000 UNIT) CAPS capsule TAKE 1 CAPSULE BY MOUTH EVERY 7 DAYS 12 capsule 2  . elvitegravir-cobicistat-emtricitabine-tenofovir (GENVOYA) 150-150-200-10 MG TABS tablet Take 1 tablet by mouth daily with breakfast. 30 tablet 5  . tadalafil (CIALIS) 10 MG tablet Take 0.5 tablets (5 mg total) by mouth daily as needed for erectile dysfunction. 10 tablet 0   No facility-administered medications prior to visit.     Past Medical History:  Diagnosis Date  . Allergy   . Anxiety   . Arthritis   . Cataract    Bilateral eyes  . 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 Depression   . Diverticulosis   . Dyslipidemia   . ED (erectile dysfunction)   . GERD (gastroesophageal reflux disease)   . History of colon polyps   . HIV infection ([place])   . Hyperlipidemia   . Hypertension   . Internal hemorrhoids   . Osteopenia   . Shingles   . Sleep apnea    does not wear a c-pap  . Ulcerative colitis ([place])   . Vitamin D deficiency      Past Surgical History:  Procedure Laterality Date  . CARPAL  TUNNEL RELEASE Right    x 2  . CARPAL TUNNEL RELEASE Left    x 2  . CATARACT EXTRACTION, BILATERAL Bilateral 10/2017  . COLONOSCOPY    . HEMORRHOID SURGERY    . MOUTH SURGERY    . TONSILLECTOMY AND ADENOIDECTOMY     age 80  . UPPER GASTROINTESTINAL ENDOSCOPY    . VASECTOMY       Review of Systems  Constitutional: Negative for appetite change, chills, fatigue, fever and unexpected weight change.  Eyes: Negative for visual disturbance.  Respiratory: Negative for cough, chest tightness, shortness of breath and wheezing.   Cardiovascular: Negative for chest pain and leg swelling.  Gastrointestinal: Negative for abdominal pain, constipation, diarrhea, nausea and  vomiting.  Genitourinary: Negative for dysuria, flank pain, frequency, genital sores, hematuria and urgency.  Skin: Negative for rash.  Allergic/Immunologic: Negative for immunocompromised state.  Neurological: Negative for dizziness and headaches.      Objective:    BP 131/85   Pulse 66   Temp 98.5 F (36.9 C) (Oral)   Wt 230 lb (104.3 kg)   BMI 36.02 kg/m  Nursing note and vital signs reviewed.  Physical Exam Constitutional:      General: He is not in acute distress.    Appearance: He is well-developed.  Eyes:     Conjunctiva/sclera: Conjunctivae normal.  Cardiovascular:     Rate and Rhythm: Normal rate and regular rhythm.     Heart sounds: Normal heart sounds. No murmur heard.  No friction rub. No gallop.   Pulmonary:     Effort: Pulmonary effort is normal. No respiratory distress.     Breath sounds: Normal breath sounds. No wheezing or rales.  Chest:     Chest wall: No tenderness.  Abdominal:     General: Bowel sounds are normal.     Palpations: Abdomen is soft.     Tenderness: There is no abdominal tenderness.  Musculoskeletal:     Cervical back: Neck supple.  Lymphadenopathy:     Cervical: No cervical adenopathy.  Skin:    General: Skin is warm and dry.     Findings: No rash.  Neurological:     Mental Status: He is alert and oriented to person, place, and time.  Psychiatric:        Behavior: Behavior normal.        Thought Content: Thought content normal.        Judgment: Judgment normal.      Depression screen [facility] 2/9 09/01/2019 08/07/2019 02/16/2019  Decreased Interest 0 0 0  Down, Depressed, Hopeless 0 0 0  PHQ - 2 Score 0 0 0       Assessment & Plan:    Patient Active Problem List   Diagnosis Date Noted  . Hypertension 08/07/2019  . Hyperglycemia 08/07/2019  . Hyperlipidemia 08/07/2019  . GAD (generalized anxiety disorder) 08/07/2019  . Localized osteoarthritis of left hand 08/07/2019  . Colon polyps 06/06/2019  . Human immunodeficiency  virus (HIV) disease ([place]) 02/16/2019  . Screening for STDs (sexually transmitted diseases) 02/16/2019  . Osteopenia 02/16/2019  . Healthcare maintenance 02/16/2019  . Ulcerative colitis without complications ([place]) 33/43/5686     Problem List Items Addressed This Visit      Other   Human immunodeficiency virus (HIV) disease ([place]) - Primary (Chronic)    Mr. Vandewater continues to have well controlled HIV disease with good adherence and tolerance to his ART regimen of Genvoya. No signs/symptoms of opportunistic infection or progressive HIV  disease. Reviewed lab work and discussed plan of care. Continue current dose of Genvoya. Plan for follow up in 6 months or sooner if needed with lab work 1-2 weeks prior to appointment.       Relevant Medications   elvitegravir-cobicistat-emtricitabine-tenofovir (GENVOYA) 150-150-200-10 MG TABS tablet   Other Relevant Orders   CBC   HIV-1 RNA quant-no reflex-bld   T-helper cell (CD4)- ([facility] clinic only)   Comprehensive metabolic panel   Screening for STDs (sexually transmitted diseases) (Chronic)   Relevant Orders   RPR   Healthcare maintenance (Chronic)     COVID vaccine up to date per recommendations. Booster not indicated at present although eligible.   Discussed importance of safe sexual practice to reduce risk of STI. Condoms declined.  Immunizations updated in primary care. Will need 1 additional dose of Pneumovax.        Other Visit Diagnoses    Pharmacologic therapy       Relevant Orders   Lipid panel       I am having Hoy Register "Bill" maintain his Humira, Vitamin D (Ergocalciferol), Centrum Silver 50+Men, escitalopram, losartan, ezetimibe, aspirin EC, Genvoya, and tadalafil.   Meds ordered this encounter  Medications  . elvitegravir-cobicistat-emtricitabine-tenofovir (GENVOYA) 150-150-200-10 MG TABS tablet    Sig: Take 1 tablet by mouth daily with breakfast.    Dispense:  30 tablet    Refill:  5    Order Specific Question:    Supervising Provider    Answer:   Carlyle Basques [[id number]]  . tadalafil (CIALIS) 10 MG tablet    Sig: Take 0.5 tablets (5 mg total) by mouth daily as needed for erectile dysfunction.    Dispense:  10 tablet    Refill:  0    Order Specific Question:   Supervising Provider    Answer:   Carlyle Basques [[id number]]     Follow-up: Return in about 3 months (around 12/02/2019), or if symptoms worsen or fail to improve.   Terri Piedra, MSN, FNP-C Nurse Practitioner [facility] [place] number: [phone]

## 2019-09-01 NOTE — Patient Instructions (Addendum)
Nice to see you.  We will continue your current dose of Genvoya.   We will send refills to the pharmacy.   Recommend flu vaccine when available and will get you Pneumovax at next office visit.  Plan for follow up in 6 months or sooner if needed with lab work 1-2 weeks prior to your appointment.  Have a great day and stay safe!

## 2019-09-01 NOTE — Assessment & Plan Note (Signed)
Mr. Bracewell continues to have well controlled HIV disease with good adherence and tolerance to his ART regimen of Genvoya. No signs/symptoms of opportunistic infection or progressive HIV disease. Reviewed lab work and discussed plan of care. Continue current dose of Genvoya. Plan for follow up in 6 months or sooner if needed with lab work 1-2 weeks prior to appointment.

## 2019-09-08 ENCOUNTER — Other Ambulatory Visit: Payer: Self-pay

## 2019-09-08 ENCOUNTER — Ambulatory Visit
Admission: RE | Admit: 2019-09-08 | Discharge: 2019-09-08 | Disposition: A | Discharge status: Home / Self Care | Payer: Medicare Other | Source: Ambulatory Visit | Attending: Family | Admitting: Family

## 2019-09-08 DIAGNOSIS — M8580 Other specified disorders of bone density and structure, unspecified site: Secondary | ICD-10-CM

## 2019-10-20 ENCOUNTER — Other Ambulatory Visit: Payer: Self-pay | Admitting: Family

## 2019-10-20 DIAGNOSIS — F32A Depression, unspecified: Secondary | ICD-10-CM

## 2019-10-20 DIAGNOSIS — E785 Hyperlipidemia, unspecified: Secondary | ICD-10-CM

## 2019-11-03 ENCOUNTER — Other Ambulatory Visit: Payer: Self-pay

## 2019-11-03 ENCOUNTER — Ambulatory Visit (HOSPITAL_COMMUNITY)
Admission: RE | Admit: 2019-11-03 | Discharge: 2019-11-03 | Disposition: A | Discharge status: Home / Self Care | Payer: Medicare Other | Source: Ambulatory Visit | Attending: Family Medicine | Admitting: Family Medicine

## 2019-11-03 ENCOUNTER — Encounter (HOSPITAL_COMMUNITY): Payer: Self-pay

## 2019-11-03 VITALS — BP 131/84 | HR 87 | Temp 100.8°F | Resp 19

## 2019-11-03 DIAGNOSIS — E785 Hyperlipidemia, unspecified: Secondary | ICD-10-CM

## 2019-11-03 DIAGNOSIS — R21 Rash and other nonspecific skin eruption: Secondary | ICD-10-CM | POA: Diagnosis not present

## 2019-11-03 DIAGNOSIS — Z888 Allergy status to other drugs, medicaments and biological substances status: Secondary | ICD-10-CM | POA: Insufficient documentation

## 2019-11-03 DIAGNOSIS — Z8249 Family history of ischemic heart disease and other diseases of the circulatory system: Secondary | ICD-10-CM | POA: Diagnosis not present

## 2019-11-03 DIAGNOSIS — I10 Essential (primary) hypertension: Secondary | ICD-10-CM | POA: Diagnosis not present

## 2019-11-03 DIAGNOSIS — J029 Acute pharyngitis, unspecified: Secondary | ICD-10-CM | POA: Insufficient documentation

## 2019-11-03 DIAGNOSIS — Z21 Asymptomatic human immunodeficiency virus [HIV] infection status: Secondary | ICD-10-CM | POA: Diagnosis not present

## 2019-11-03 DIAGNOSIS — Z87891 Personal history of nicotine dependence: Secondary | ICD-10-CM | POA: Diagnosis not present

## 2019-11-03 DIAGNOSIS — Z79899 Other long term (current) drug therapy: Secondary | ICD-10-CM | POA: Insufficient documentation

## 2019-11-03 DIAGNOSIS — Z7982 Long term (current) use of aspirin: Secondary | ICD-10-CM | POA: Insufficient documentation

## 2019-11-03 DIAGNOSIS — R220 Localized swelling, mass and lump, head: Secondary | ICD-10-CM | POA: Diagnosis not present

## 2019-11-03 DIAGNOSIS — R509 Fever, unspecified: Secondary | ICD-10-CM

## 2019-11-03 DIAGNOSIS — R1310 Dysphagia, unspecified: Secondary | ICD-10-CM | POA: Diagnosis not present

## 2019-11-03 DIAGNOSIS — F32A Depression, unspecified: Secondary | ICD-10-CM

## 2019-11-03 DIAGNOSIS — Z20822 Contact with and (suspected) exposure to covid-19: Secondary | ICD-10-CM | POA: Insufficient documentation

## 2019-11-03 LAB — COMPREHENSIVE METABOLIC PANEL
ALT: 22 U/L (ref 0–44)
AST: 24 U/L (ref 15–41)
Albumin: 3.6 g/dL (ref 3.5–5.0)
Alkaline Phosphatase: 71 U/L (ref 38–126)
Anion gap: 10 (ref 5–15)
BUN: 8 mg/dL (ref 8–23)
CO2: 25 mmol/L (ref 22–32)
Calcium: 9.7 mg/dL (ref 8.9–10.3)
Chloride: 99 mmol/L (ref 98–111)
Creatinine, Ser: 0.84 mg/dL (ref 0.61–1.24)
GFR, Estimated: >60 mL/min (ref >60)
Glucose, Bld: 119 mg/dL — ABNORMAL HIGH (ref 70–99)
Potassium: 4.2 mmol/L (ref 3.5–5.1)
Sodium: 134 mmol/L — ABNORMAL LOW (ref 135–145)
Total Bilirubin: 1 mg/dL (ref 0.3–1.2)
Total Protein: 7.1 g/dL (ref 6.5–8.1)

## 2019-11-03 LAB — CBC WITH DIFFERENTIAL/PLATELET
Abs Immature Granulocytes: 0.03 10*3/uL (ref 0.00–0.07)
Basophils Absolute: 0.1 10*3/uL (ref 0.0–0.1)
Basophils Relative: 1 %
Eosinophils Absolute: 0.1 10*3/uL (ref 0.0–0.5)
Eosinophils Relative: 1 %
HCT: 44.4 % (ref 39.0–52.0)
Hemoglobin: 14.8 g/dL (ref 13.0–17.0)
Immature Granulocytes: 0 %
Lymphocytes Relative: 21 %
Lymphs Abs: 2.1 10*3/uL (ref 0.7–4.0)
MCH: 31.3 pg (ref 26.0–34.0)
MCHC: 33.3 g/dL (ref 30.0–36.0)
MCV: 93.9 fL (ref 80.0–100.0)
Monocytes Absolute: 1.2 10*3/uL — ABNORMAL HIGH (ref 0.1–1.0)
Monocytes Relative: 12 %
Neutro Abs: 6.6 10*3/uL (ref 1.7–7.7)
Neutrophils Relative %: 65 %
Platelets: 365 10*3/uL (ref 150–400)
RBC: 4.73 MIL/uL (ref 4.22–5.81)
RDW: 12.8 % (ref 11.5–15.5)
WBC: 10 10*3/uL (ref 4.0–10.5)
nRBC: 0 % (ref 0.0–0.2)

## 2019-11-03 LAB — RESP PANEL BY RT PCR (RSV, FLU A&B, COVID)
Influenza A by PCR: NEGATIVE
Influenza B by PCR: NEGATIVE
Respiratory Syncytial Virus by PCR: NEGATIVE
SARS Coronavirus 2 by RT PCR: NEGATIVE

## 2019-11-03 MED ORDER — AMOXICILLIN-POT CLAVULANATE 875-125 MG PO TABS: 1.0000 | ORAL_TABLET | Freq: Two times a day (BID) | ORAL | 0 refills | Status: DC

## 2019-11-03 MED ORDER — ACETAMINOPHEN 325 MG PO TABS
ORAL_TABLET | ORAL | Status: AC
  Filled 2019-11-03: qty 2

## 2019-11-03 MED ORDER — ACETAMINOPHEN 325 MG PO TABS
650.0000 mg | ORAL_TABLET | Freq: Once | ORAL | Status: AC
  Administered 2019-11-03: 650 mg via ORAL

## 2019-11-03 NOTE — Discharge Instructions (Addendum)
Drawing some  basic lab work.  Covid swab pending. Treating for possible infection  Zyrtec daily and benadryl at bedtime  Follow up as needed for continued or worsening symptoms

## 2019-11-03 NOTE — ED Provider Notes (Signed)
[place]    CSN: [id number] Arrival date & time: 11/03/19  1244      History   Chief Complaint Chief Complaint  Patient presents with  . Dysphagia  . Sore Throat    HPI Robert Park is a 69 y.o. male.   Patient is a 69 year old male with past medical history of allergy, anxiety, arthritis, CMV colitis, depression, diverticulosis, dyslipidemia, ED, GERD, HIV, hypertension, hyperlipidemia.  He presents today with multiple complaints.  Reporting some swelling to tongue approximate 3 weeks ago and sh after eating a tomato.  Also has some mild congestion, sore throat and runny nose 3 weeks ago.  Attributes this all to his allergies.  Also the rash started on the facial area and has spread now to bilateral arms, trunk. The rash is not itchy or painful.  He noticed this approximately 2 weeks ago.  Denies any specific fevers, chills, body aches, shortness of breath.  Has had some left facial swelling and tenderness.  Previously had tooth extracted left lower mouth area.  No trouble swallowing or breathing. Denies any joint pain. Denies any recent changes in lotions, detergents, foods or other possible irritants. No recent travel. Nobody else at home has the rash. Patient has been outside but denies any contact with plants or insects. No new foods or medications.        Past Medical History:  Diagnosis Date  . Allergy   . Anxiety   . Arthritis   . Cataract    Bilateral eyes  . 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 Depression   . Diverticulosis   . Dyslipidemia   . ED (erectile dysfunction)   . GERD (gastroesophageal reflux disease)   . History of colon polyps   . HIV infection ([place])   . Hyperlipidemia   . Hypertension   . Internal hemorrhoids   . Osteopenia   . Shingles   . Sleep apnea    does not wear a c-pap  . Ulcerative colitis ([place])   . Vitamin D deficiency     Patient Active Problem List   Diagnosis Date Noted  . Hypertension 08/07/2019  . Hyperglycemia  08/07/2019  . Hyperlipidemia 08/07/2019  . GAD (generalized anxiety disorder) 08/07/2019  . Localized osteoarthritis of left hand 08/07/2019  . Colon polyps 06/06/2019  . Human immunodeficiency virus (HIV) disease ([place]) 02/16/2019  . Screening for STDs (sexually transmitted diseases) 02/16/2019  . Osteopenia 02/16/2019  . Healthcare maintenance 02/16/2019  . Ulcerative colitis without complications ([place]) 15/94/5859    Past Surgical History:  Procedure Laterality Date  . CARPAL TUNNEL RELEASE Right    x 2  . CARPAL TUNNEL RELEASE Left    x 2  . CATARACT EXTRACTION, BILATERAL Bilateral 10/2017  . COLONOSCOPY    . HEMORRHOID SURGERY    . MOUTH SURGERY    . TONSILLECTOMY AND ADENOIDECTOMY     age 80  . UPPER GASTROINTESTINAL ENDOSCOPY    . VASECTOMY         Home Medications    Prior to Admission medications   Medication Sig Start Date End Date Taking? Authorizing Provider  Adalimumab (HUMIRA) 40 MG/0.4ML PSKT Inject into the skin.   Yes [provider]  aspirin EC 81 MG tablet Take 1 tablet (81 mg total) by mouth daily. Swallow whole. 08/07/19 08/06/20 Yes Axel Filler, MD  elvitegravir-cobicistat-emtricitabine-tenofovir (GENVOYA) 150-150-200-10 MG TABS tablet Take 1 tablet by mouth daily with breakfast. 09/01/19  Yes Golden Circle, FNP  escitalopram [place]ous[place])  20 MG tablet TAKE 1 TABLET(20 MG) BY MOUTH DAILY 10/20/19  Yes Golden Circle, FNP  ezetimibe (ZETIA) 10 MG tablet TAKE 1 TABLET(10 MG) BY MOUTH DAILY 10/20/19  Yes Golden Circle, FNP  losartan (COZAAR) 50 MG tablet TAKE 1 TABLET(50 MG) BY MOUTH DAILY 10/20/19  Yes Golden Circle, FNP  Vitamin D, Ergocalciferol, (DRISDOL) 1.25 MG (50000 UNIT) CAPS capsule TAKE 1 CAPSULE BY MOUTH EVERY 7 DAYS 06/19/19  Yes Golden Circle, FNP  amoxicillin-clavulanate (AUGMENTIN) 875-125 MG tablet Take 1 tablet by mouth every 12 (twelve) hours. 11/03/19   Loura Halt A, NP  Multiple Vitamins-Minerals (CENTRUM  SILVER 50+MEN) TABS Take by mouth. Three times per week    [provider]  tadalafil (CIALIS) 10 MG tablet Take 0.5 tablets (5 mg total) by mouth daily as needed for erectile dysfunction. 09/01/19   Golden Circle, FNP    Family History Family History  Problem Relation Age of Onset  . Colon cancer Mother 108  . Heart failure Mother   . Cervical cancer Mother   . Leukemia Mother   . Diabetes Mother   . Lung cancer Father   . Diabetes Sister   . Diverticulitis Brother   . Diabetes Maternal Grandmother   . Diabetes Brother   . Hypertension Brother   . Hypertension Sister   . Post-traumatic stress disorder Son   . Hypertension Son   . Hyperlipidemia Son   . Anxiety disorder Son   . Esophageal cancer Neg Hx   . Rectal cancer Neg Hx   . Stomach cancer Neg Hx     Social History Social History   Tobacco Use  . Smoking status: Former Smoker    Types: Cigarettes    Quit date: 06/06/1987    Years since quitting: 32.4  . Smokeless tobacco: Never Used  . Tobacco comment: quit 32 years ago  Vaping Use  . Vaping Use: Never used  Substance Use Topics  . Alcohol use: Yes    Comment: every other day -social  . Drug use: Yes    Types: Marijuana    Comment: occassional usage - last smoked 3 months ago     Allergies   Prednisone, Statins, and Sulfur   Review of Systems Review of Systems   Physical Exam Triage Vital Signs ED Triage Vitals  Enc Vitals Group     BP 11/03/19 1339 131/84     Pulse Rate 11/03/19 1339 87     Resp 11/03/19 1339 19     Temp 11/03/19 1339 (!) 100.8 F (38.2 C)     Temp Source 11/03/19 1339 Oral     SpO2 11/03/19 1339 97 %     Weight --      Height --      Head Circumference --      Peak Flow --      Pain Score 11/03/19 1334 4     Pain Loc --      Pain Edu? --      Excl. in [place]? --    No data found.  Updated Vital Signs BP 131/84 (BP Location: Left Arm)   Pulse 87   Temp (!) 100.8 F (38.2 C) (Oral)   Resp 19   SpO2 97%     Visual Acuity Right Eye Distance:   Left Eye Distance:   Bilateral Distance:    Right Eye Near:   Left Eye Near:    Bilateral Near:     Physical Exam  Vitals and nursing note reviewed.  Constitutional:      General: He is not in acute distress.    Appearance: Normal appearance. He is not ill-appearing, toxic-appearing or diaphoretic.  HENT:     Head: Normocephalic and atraumatic.      Comments: TTP and mild swelling     Left Ear: Tympanic membrane and ear canal normal.     Nose: Nose normal.     Mouth/Throat:     Pharynx: Oropharynx is clear.      Comments: Hole  from previously extracted tooth.  Eyes:     Conjunctiva/sclera: Conjunctivae normal.  Cardiovascular:     Rate and Rhythm: Normal rate and regular rhythm.  Pulmonary:     Effort: Pulmonary effort is normal.     Breath sounds: Normal breath sounds.  Musculoskeletal:        General: Normal range of motion.     Cervical back: Normal range of motion.  Skin:    General: Skin is warm and dry.     Findings: Rash present.     Comments: Widespread macular rash to face, arms and torso area.  Neurological:     Mental Status: He is alert.  Psychiatric:        Mood and Affect: Mood normal.      UC Treatments / Results  Labs (all labs ordered are listed, but only abnormal results are displayed) Labs Reviewed  RESP PANEL BY RT PCR (RSV, FLU A&B, COVID)  CBC WITH DIFFERENTIAL/PLATELET  COMPREHENSIVE METABOLIC PANEL    EKG   Radiology No results found.  Procedures Procedures (including critical care time)  Medications Ordered in UC Medications  acetaminophen (TYLENOL) tablet 650 mg (650 mg Oral Given 11/03/19 1346)    Initial Impression / Assessment and Plan / UC Course  I have reviewed the triage vital signs and the nursing notes.  Pertinent labs & imaging results that were available during my care of the patient were reviewed by me and considered in my medical decision making (see chart for  details).     Rash- possible allergic reaction to something versus viral Recommended start Zyrtec daily and Benadryl in the evening as needed. No need for steroids at this time.  Patient already immunocompromised and taking HIV medication and Humira for Crohn's.  Left facial swelling, fever Possible dental abscess versus infected salivary gland.  Not sure if the rash in this facial swelling and fever are connected.  Most likely 2 separate issues. We will going to cover for infection with Augmentin Recommend Tylenol and ibuprofen as needed Drawing some basic lab work. Follow up as needed for continued or worsening symptoms   Final Clinical Impressions(s) / UC Diagnoses   Final diagnoses:  Rash and nonspecific skin eruption  Facial swelling  Fever, unspecified     Discharge Instructions     Drawing some  basic lab work.  Covid swab pending. Treating for possible infection  Zyrtec daily and benadryl at bedtime  Follow up as needed for continued or worsening symptoms     ED Prescriptions    Medication Sig Dispense Auth. Provider   amoxicillin-clavulanate (AUGMENTIN) 875-125 MG tablet Take 1 tablet by mouth every 12 (twelve) hours. 14 tablet Kaitlyn Skowron A, NP     PDMP not reviewed this encounter.   Orvan July, NP 11/03/19 1551

## 2019-11-03 NOTE — ED Triage Notes (Signed)
Pt c/o swelling to tongue onset approx 3 weeks ago and dysphagia after having a tomato. Pt also reports sore throat, congestion, runny nose onset 3 weeks ago. Productive cough with clear and at times greenish/yellow sputum that pt attributes to "allergies".  Pt reports he frequently bites his tongue, especially at night while sleeping.  Pt reports rash began on approx 2 weeks initially on face. Diffuse red rash to trunk, arms, legs, face noted today. Reports decreased appetite for the past few weeks.   Denies SOB, n/v, fever.  Pt able to swallow, speak full sentences rapidly w/o difficulty.  Bilateral lung sounds CTA, mild enlargement to left cervical area.

## 2019-11-19 ENCOUNTER — Other Ambulatory Visit: Payer: Self-pay | Admitting: Family

## 2019-11-19 DIAGNOSIS — F32A Depression, unspecified: Secondary | ICD-10-CM

## 2019-11-19 DIAGNOSIS — E785 Hyperlipidemia, unspecified: Secondary | ICD-10-CM

## 2019-11-24 ENCOUNTER — Other Ambulatory Visit: Payer: Self-pay | Admitting: Student in an Organized Health Care Education/Training Program

## 2019-11-24 DIAGNOSIS — F32A Depression, unspecified: Secondary | ICD-10-CM

## 2019-11-24 DIAGNOSIS — E785 Hyperlipidemia, unspecified: Secondary | ICD-10-CM

## 2019-11-24 MED ORDER — LOSARTAN POTASSIUM 50 MG PO TABS: 50.0000 mg | ORAL_TABLET | Freq: Every day | ORAL | 0 refills | Status: DC

## 2019-11-24 MED ORDER — ESCITALOPRAM OXALATE 20 MG PO TABS: 20.0000 mg | ORAL_TABLET | Freq: Every day | ORAL | 0 refills | Status: DC

## 2019-11-24 MED ORDER — EZETIMIBE 10 MG PO TABS: 10.0000 mg | ORAL_TABLET | Freq: Every day | ORAL | 0 refills | Status: DC

## 2019-11-24 NOTE — Telephone Encounter (Signed)
Spoke with the patient.  He has sch his yearly appt for 02/05/2019 with Dr. Evette Doffing @ 10:15am.

## 2019-11-24 NOTE — Telephone Encounter (Signed)
Refill Request  Pt reports he tried to pick up his medication below and was told to contact his Dr's office.  Pt would like a call back.    ezetimibe (ZETIA) 10 MG tablet losartan (COZAAR) 50 MG tablet escitalopram (LEXAPRO) 20 MG tablet    [id number] - [place], [place] - [address] (Ph: [phone])  Order Details

## 2019-12-13 ENCOUNTER — Other Ambulatory Visit: Payer: Self-pay

## 2019-12-13 ENCOUNTER — Other Ambulatory Visit: Payer: Self-pay | Admitting: Student in an Organized Health Care Education/Training Program

## 2019-12-13 DIAGNOSIS — E785 Hyperlipidemia, unspecified: Secondary | ICD-10-CM

## 2019-12-13 DIAGNOSIS — F32A Depression, unspecified: Secondary | ICD-10-CM

## 2019-12-13 MED ORDER — LOSARTAN POTASSIUM 50 MG PO TABS: 50.0000 mg | ORAL_TABLET | Freq: Every day | ORAL | 0 refills | Status: DC

## 2019-12-13 MED ORDER — TADALAFIL 10 MG PO TABS: 5.0000 mg | ORAL_TABLET | Freq: Every day | ORAL | 0 refills | Status: DC | PRN

## 2019-12-13 MED ORDER — EZETIMIBE 10 MG PO TABS: 10.0000 mg | ORAL_TABLET | Freq: Every day | ORAL | 0 refills | Status: DC

## 2019-12-13 MED ORDER — ESCITALOPRAM OXALATE 20 MG PO TABS: 20.0000 mg | ORAL_TABLET | Freq: Every day | ORAL | 0 refills | Status: DC

## 2020-02-05 ENCOUNTER — Ambulatory Visit (INDEPENDENT_AMBULATORY_CARE_PROVIDER_SITE_OTHER): Payer: Medicare Other | Admitting: Student in an Organized Health Care Education/Training Program

## 2020-02-05 ENCOUNTER — Encounter: Payer: Self-pay | Admitting: Student in an Organized Health Care Education/Training Program

## 2020-02-05 VITALS — BP 132/84 | HR 77 | Temp 99.0°F | Ht 67.0 in | Wt 226.4 lb

## 2020-02-05 DIAGNOSIS — J309 Allergic rhinitis, unspecified: Secondary | ICD-10-CM

## 2020-02-05 DIAGNOSIS — E782 Mixed hyperlipidemia: Secondary | ICD-10-CM

## 2020-02-05 DIAGNOSIS — Z0000 Encounter for general adult medical examination without abnormal findings: Secondary | ICD-10-CM

## 2020-02-05 DIAGNOSIS — I10 Essential (primary) hypertension: Secondary | ICD-10-CM | POA: Diagnosis not present

## 2020-02-05 MED ORDER — TADALAFIL 10 MG PO TABS: 5.0000 mg | ORAL_TABLET | Freq: Every day | ORAL | 0 refills | Status: DC | PRN

## 2020-02-05 MED ORDER — AFRIN NASAL SPRAY 0.05 % NA SOLN
1.0000 | Freq: Two times a day (BID) | NASAL | 0 refills | Status: DC | PRN
Start: 2020-02-05 — End: 2020-05-10

## 2020-02-05 MED ORDER — FLUTICASONE PROPIONATE 50 MCG/ACT NA SUSP: 1.0000 | Freq: Every day | NASAL | 2 refills | Status: DC

## 2020-02-05 NOTE — Assessment & Plan Note (Signed)
Primary prevention, very elevated 10-year risk, intolerant to statins in the past.  We will continue with second line options of aspirin 81 mg daily and ezetimibe 10 mg daily.

## 2020-02-05 NOTE — Progress Notes (Signed)
   Assessment and Plan:  See Encounters tab for problem-based medical decision making.   __________________________________________________________  HPI:   70 year old person living with HIV and ulcerative colitis here for follow-up of hypertension.  Patient is doing very well, its been 6 months since I have last seen him.  He had one urgent care visit for a acute pharyngitis treated with antibiotics and intranasal antiinflammatories.  His ulcerative colitis is doing very well on Humira through Dr. Loletha Carrow.  He is also doing a great job treating his HIV through [facility].  He reports being active, exercises several times per week, no chest pain or shortness of breath.  Denies any fevers or chills, he still being very careful to avoid exposures to SARS-CoV-2.  Reports good adherence with his medications, no side effects.  __________________________________________________________  Problem List: Patient Active Problem List   Diagnosis Date Noted  . Hypertension 08/07/2019    Priority: High  . Hyperlipidemia 08/07/2019    Priority: High  . Ulcerative colitis without complications ([place]) 92/11/9415    Priority: High  . Hyperglycemia 08/07/2019    Priority: Medium  . GAD (generalized anxiety disorder) 08/07/2019    Priority: Medium  . Localized osteoarthritis of left hand 08/07/2019    Priority: Medium  . Human immunodeficiency virus (HIV) disease ([place]) 02/16/2019    Priority: Medium  . Osteopenia 02/16/2019    Priority: Medium  . Allergic sinusitis 02/05/2020    Priority: Low  . Colon polyps 06/06/2019    Priority: Low  . Screening for STDs (sexually transmitted diseases) 02/16/2019    Priority: Low  . Healthcare maintenance 02/16/2019    Priority: Low    Medications: Reconciled today in Epic __________________________________________________________  Physical Exam:  Vital Signs: Vitals:   02/05/20 1055  BP: 132/84  Pulse: 77  Temp: 99 F (37.2 C)  TempSrc: Oral  SpO2: 97%   Weight: 226 lb 6.4 oz (102.7 kg)  Height: 5' 7"  (1.702 m)    Gen: Well appearing, NAD ENT: OP clear without erythema or exudate.  Neck: No cervical LAD, No thyromegaly or nodules, No JVD. CV: RRR, no murmurs Pulm: Normal effort, CTA throughout, no wheezing Ext: Warm, no edema, normal joints Skin: No atypical appearing moles. No rashes.  2 small inclusion cysts on his back with erosions and evidence of recent drainage.

## 2020-02-05 NOTE — Assessment & Plan Note (Signed)
Blood pressure is well controlled today.  Plan to continue with losartan 50 mg daily.

## 2020-02-05 NOTE — Assessment & Plan Note (Signed)
History of recurrence episodes of acute sinusitis, likely allergic in origin based on his description and seasonality.  We talked about sinus irrigation which she already does an excellent job with.  Patient to use fluticasone intranasal after irrigation.  I prescribed Afrin which she has found helpful in the past, but warned him to only use this for 1 or 2 days, not to exceed daily use for greater than 3 days as it will make the condition worse.

## 2020-02-05 NOTE — Assessment & Plan Note (Addendum)
Up-to-date on all age-appropriate cancer screenings and vaccinations.  Patient requests dermatology referral for skin check, at average risk for skin cancer.

## 2020-02-05 NOTE — Addendum Note (Signed)
Addended by: Lalla Brothers T on: 02/05/2020 02:38 PM   Modules accepted: Orders

## 2020-02-19 ENCOUNTER — Other Ambulatory Visit: Payer: Medicare Other

## 2020-02-23 ENCOUNTER — Other Ambulatory Visit: Payer: Medicare Other

## 2020-02-23 ENCOUNTER — Other Ambulatory Visit: Payer: Self-pay

## 2020-02-23 ENCOUNTER — Other Ambulatory Visit: Payer: Self-pay | Admitting: Family

## 2020-02-23 DIAGNOSIS — Z113 Encounter for screening for infections with a predominantly sexual mode of transmission: Secondary | ICD-10-CM

## 2020-02-23 DIAGNOSIS — B20 Human immunodeficiency virus [HIV] disease: Secondary | ICD-10-CM

## 2020-02-23 DIAGNOSIS — Z79899 Other long term (current) drug therapy: Secondary | ICD-10-CM

## 2020-02-26 ENCOUNTER — Other Ambulatory Visit: Payer: Self-pay | Admitting: Family

## 2020-02-27 ENCOUNTER — Telehealth: Payer: Self-pay

## 2020-02-27 LAB — COMPREHENSIVE METABOLIC PANEL
AG Ratio: 1.7 (calc) (ref 1.0–2.5)
ALT: 19 U/L (ref 9–46)
AST: 19 U/L (ref 10–35)
Albumin: 4.3 g/dL (ref 3.6–5.1)
Alkaline phosphatase (APISO): 76 U/L (ref 35–144)
BUN: 13 mg/dL (ref 7–25)
CO2: 29 mmol/L (ref 20–32)
Calcium: 10.1 mg/dL (ref 8.6–10.3)
Chloride: 98 mmol/L (ref 98–110)
Creat: 0.87 mg/dL (ref 0.70–1.25)
Globulin: 2.5 g/dL (calc) (ref 1.9–3.7)
Glucose, Bld: 141 mg/dL — ABNORMAL HIGH (ref 65–99)
Potassium: 4.5 mmol/L (ref 3.5–5.3)
Sodium: 135 mmol/L (ref 135–146)
Total Bilirubin: 0.6 mg/dL (ref 0.2–1.2)
Total Protein: 6.8 g/dL (ref 6.1–8.1)

## 2020-02-27 LAB — LIPID PANEL
Cholesterol: 222 mg/dL — ABNORMAL HIGH (ref <200)
HDL: 40 mg/dL (ref >=40)
LDL Cholesterol (Calc): 131 mg/dL (calc) — ABNORMAL HIGH
Non-HDL Cholesterol (Calc): 182 mg/dL (calc) — ABNORMAL HIGH (ref <130)
Total CHOL/HDL Ratio: 5.6 (calc) — ABNORMAL HIGH (ref <5.0)
Triglycerides: 358 mg/dL — ABNORMAL HIGH (ref <150)

## 2020-02-27 LAB — CBC
HCT: 45.9 % (ref 38.5–50.0)
Hemoglobin: 15.9 g/dL (ref 13.2–17.1)
MCH: 31.8 pg (ref 27.0–33.0)
MCHC: 34.6 g/dL (ref 32.0–36.0)
MCV: 91.8 fL (ref 80.0–100.0)
MPV: 10.6 fL (ref 7.5–12.5)
Platelets: 246 10*3/uL (ref 140–400)
RBC: 5 10*6/uL (ref 4.20–5.80)
RDW: 13.5 % (ref 11.0–15.0)
WBC: 7.7 10*3/uL (ref 3.8–10.8)

## 2020-02-27 LAB — FLUORESCENT TREPONEMAL AB(FTA)-IGG-BLD: Fluorescent Treponemal ABS: REACTIVE — AB

## 2020-02-27 LAB — T-HELPER CELLS (CD4) COUNT (NOT AT ARMC)
Absolute CD4: 1011 cells/uL (ref 490–1740)
CD4 T Helper %: 34 % (ref 30–61)
Total lymphocyte count: 2995 cells/uL (ref 850–3900)

## 2020-02-27 LAB — RPR: RPR Ser Ql: REACTIVE — AB

## 2020-02-27 LAB — HIV-1 RNA QUANT-NO REFLEX-BLD
HIV 1 RNA Quant: 39 Copies/mL — ABNORMAL HIGH
HIV-1 RNA Quant, Log: 1.59 Log cps/mL — ABNORMAL HIGH

## 2020-02-27 LAB — RPR TITER: RPR Titer: 1:32 {titer} — ABNORMAL HIGH

## 2020-02-27 NOTE — Telephone Encounter (Signed)
Patient returning call. RN relayed positive syphilis results per Terri Piedra, NP. Advised patient that Marya Amsler would like for him to come in for treatment, patient scheduled tomorrow 02/28/20. Advised patient no sex until treatment is completed plus an additional 10 days and instructed to notify sexual partners for testing and treatment. Patient verbalized understanding and has no further questions. Verified patient has no known allergy to penicillin.   Beryle Flock, RN

## 2020-02-27 NOTE — Telephone Encounter (Signed)
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 He will need 2.4 million units of Bicillin IM once. Thanks.

## 2020-02-27 NOTE — Telephone Encounter (Signed)
Attempted to call patient to relay results per Terri Piedra, NP, no answer. Left HIPAA compliant voicemail requesting callback.   Beryle Flock, RN

## 2020-02-28 ENCOUNTER — Emergency Department (HOSPITAL_COMMUNITY): Payer: Medicare Other

## 2020-02-28 ENCOUNTER — Ambulatory Visit (HOSPITAL_COMMUNITY)
Admission: EM | Admit: 2020-02-28 | Discharge: 2020-02-28 | Disposition: A | Discharge status: Home / Self Care | Payer: Medicare Other | Attending: Family Medicine | Admitting: Family Medicine

## 2020-02-28 ENCOUNTER — Encounter (HOSPITAL_COMMUNITY): Payer: Self-pay | Admitting: Emergency Medicine

## 2020-02-28 ENCOUNTER — Encounter (HOSPITAL_COMMUNITY): Payer: Self-pay

## 2020-02-28 ENCOUNTER — Ambulatory Visit: Payer: Medicare Other

## 2020-02-28 ENCOUNTER — Emergency Department (HOSPITAL_COMMUNITY)
Admission: EM | Admit: 2020-02-28 | Discharge: 2020-02-29 | Disposition: A | Discharge status: Home / Self Care | Payer: Medicare Other | Attending: Emergency Medicine | Admitting: Emergency Medicine

## 2020-02-28 ENCOUNTER — Other Ambulatory Visit: Payer: Self-pay

## 2020-02-28 DIAGNOSIS — R1032 Left lower quadrant pain: Secondary | ICD-10-CM | POA: Diagnosis not present

## 2020-02-28 DIAGNOSIS — R509 Fever, unspecified: Secondary | ICD-10-CM | POA: Diagnosis not present

## 2020-02-28 DIAGNOSIS — Z5321 Procedure and treatment not carried out due to patient leaving prior to being seen by health care provider: Secondary | ICD-10-CM | POA: Insufficient documentation

## 2020-02-28 DIAGNOSIS — J069 Acute upper respiratory infection, unspecified: Secondary | ICD-10-CM | POA: Diagnosis not present

## 2020-02-28 DIAGNOSIS — A539 Syphilis, unspecified: Secondary | ICD-10-CM

## 2020-02-28 DIAGNOSIS — J988 Other specified respiratory disorders: Secondary | ICD-10-CM | POA: Diagnosis not present

## 2020-02-28 LAB — URINALYSIS, ROUTINE W REFLEX MICROSCOPIC
Bilirubin Urine: NEGATIVE
Glucose, UA: NEGATIVE mg/dL
Hgb urine dipstick: NEGATIVE
Ketones, ur: NEGATIVE mg/dL
Leukocytes,Ua: NEGATIVE
Nitrite: NEGATIVE
Protein, ur: NEGATIVE mg/dL
Specific Gravity, Urine: 1.017 (ref 1.005–1.030)
pH: 7 (ref 5.0–8.0)

## 2020-02-28 LAB — COMPREHENSIVE METABOLIC PANEL
ALT: 22 U/L (ref 0–44)
AST: 29 U/L (ref 15–41)
Albumin: 3.9 g/dL (ref 3.5–5.0)
Alkaline Phosphatase: 70 U/L (ref 38–126)
Anion gap: 15 (ref 5–15)
BUN: 10 mg/dL (ref 8–23)
CO2: 17 mmol/L — ABNORMAL LOW (ref 22–32)
Calcium: 9.4 mg/dL (ref 8.9–10.3)
Chloride: 98 mmol/L (ref 98–111)
Creatinine, Ser: 0.98 mg/dL (ref 0.61–1.24)
GFR, Estimated: >60 mL/min (ref >60)
Glucose, Bld: 161 mg/dL — ABNORMAL HIGH (ref 70–99)
Potassium: 4.3 mmol/L (ref 3.5–5.1)
Sodium: 130 mmol/L — ABNORMAL LOW (ref 135–145)
Total Bilirubin: 1 mg/dL (ref 0.3–1.2)
Total Protein: 6.6 g/dL (ref 6.5–8.1)

## 2020-02-28 LAB — CBC WITH DIFFERENTIAL/PLATELET
Abs Immature Granulocytes: 0.05 10*3/uL (ref 0.00–0.07)
Basophils Absolute: 0.1 10*3/uL (ref 0.0–0.1)
Basophils Relative: 0 %
Eosinophils Absolute: 0.1 10*3/uL (ref 0.0–0.5)
Eosinophils Relative: 1 %
HCT: 46.3 % (ref 39.0–52.0)
Hemoglobin: 15.5 g/dL (ref 13.0–17.0)
Immature Granulocytes: 0 %
Lymphocytes Relative: 17 %
Lymphs Abs: 2.2 10*3/uL (ref 0.7–4.0)
MCH: 31.6 pg (ref 26.0–34.0)
MCHC: 33.5 g/dL (ref 30.0–36.0)
MCV: 94.3 fL (ref 80.0–100.0)
Monocytes Absolute: 1 10*3/uL (ref 0.1–1.0)
Monocytes Relative: 8 %
Neutro Abs: 9.2 10*3/uL — ABNORMAL HIGH (ref 1.7–7.7)
Neutrophils Relative %: 74 %
Platelets: 295 10*3/uL (ref 150–400)
RBC: 4.91 MIL/uL (ref 4.22–5.81)
RDW: 13.5 % (ref 11.5–15.5)
WBC: 12.6 10*3/uL — ABNORMAL HIGH (ref 4.0–10.5)
nRBC: 0 % (ref 0.0–0.2)

## 2020-02-28 LAB — LIPASE, BLOOD: Lipase: 30 U/L (ref 11–51)

## 2020-02-28 LAB — LACTIC ACID, PLASMA: Lactic Acid, Venous: 1.7 mmol/L (ref 0.5–1.9)

## 2020-02-28 IMAGING — DX DG CHEST 2V
2 series · 2 of 2 positions shown · non-contrast
Comparison: None.

CLINICAL DATA: Respiratory infection

EXAM:
CHEST - 2 VIEW

[chest pa]
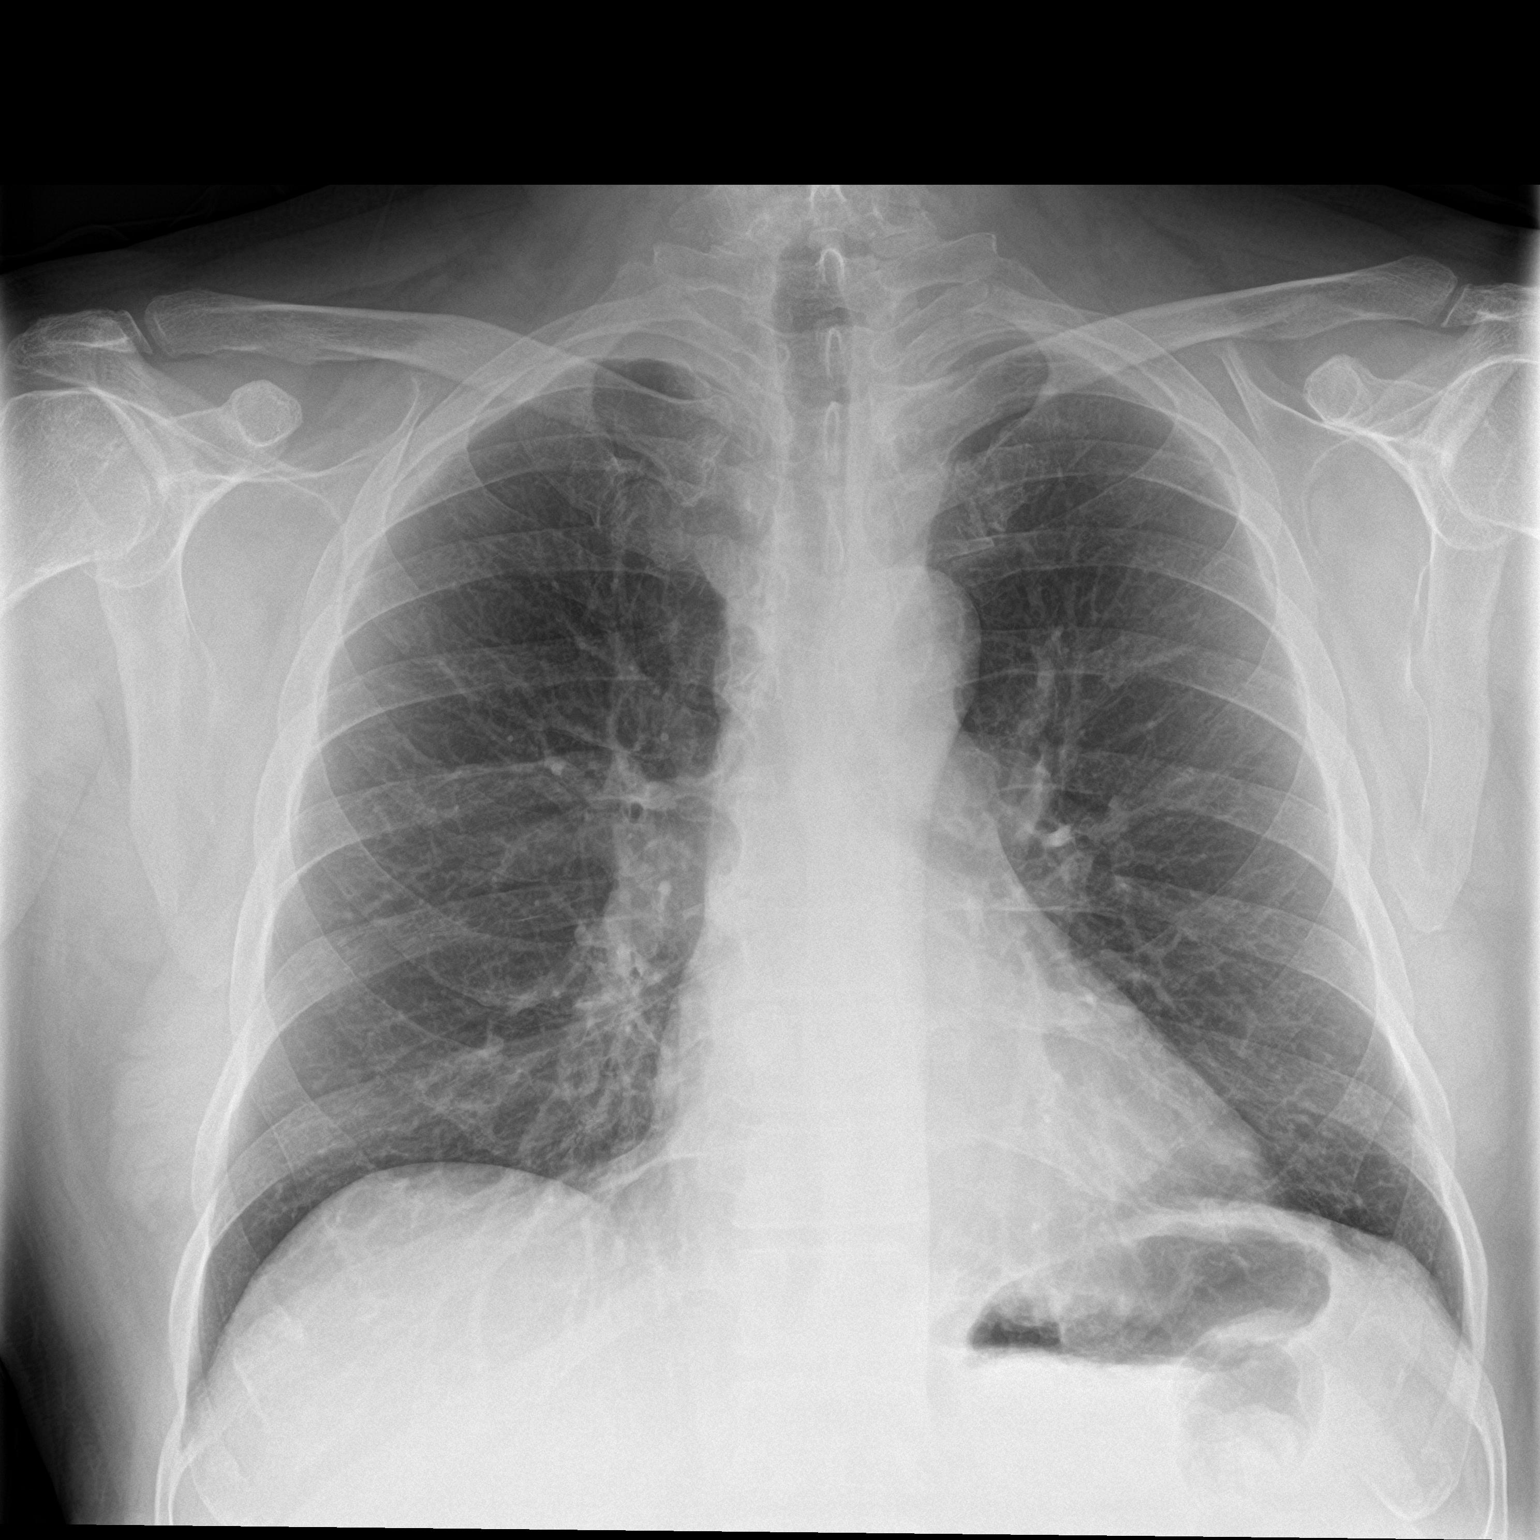

[chest lat]
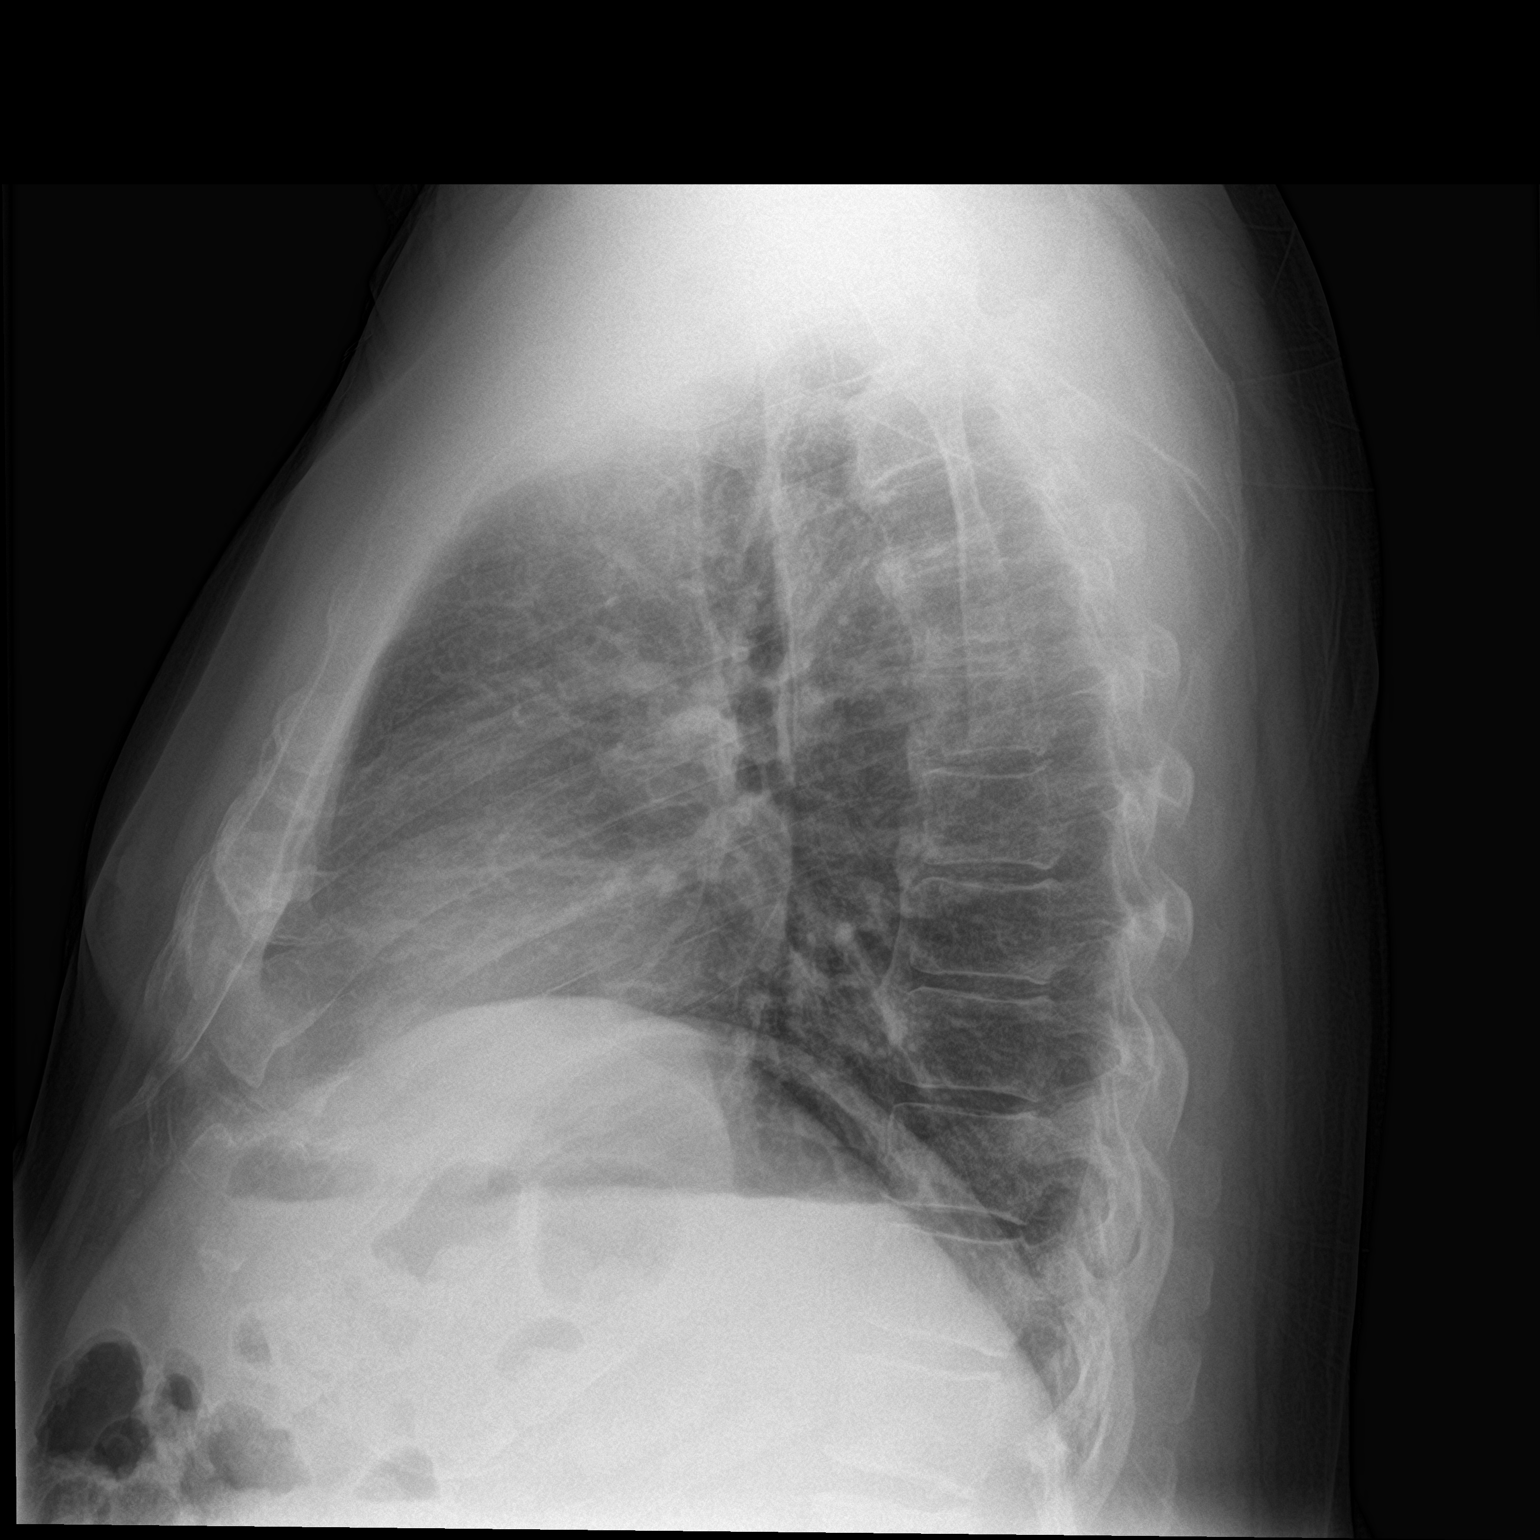

[2 of 2 positions shown; findings below may reference images not displayed]

FINDINGS: The heart size and mediastinal contours are within normal limits.
Both lungs are clear. Degenerative changes of the spine.
IMPRESSION: No active cardiopulmonary disease.

## 2020-02-28 MED ORDER — PENICILLIN G BENZATHINE 1200000 UNIT/2ML IM SUSP: 1.2000 10*6.[IU] | Freq: Once | INTRAMUSCULAR | Status: DC

## 2020-02-28 NOTE — ED Triage Notes (Signed)
Feeling overly warm, headache, feels like abdomen is particularly hard .  Patient reports these symptoms started in the past couple of hours.  Patient received antibiotic treatment today for syphilis. Patient is not having any breathing or swallowing issues.  Speaking in complete sentences.  No visible swelling

## 2020-02-28 NOTE — ED Notes (Signed)
Patient is being discharged from the Urgent Care and sent to the Emergency Department via pov . Per dr hagler, patient is in need of higher level of care due to abdominal tenderness. Patient is aware and verbalizes understanding of plan of care.  Vitals:   02/28/20 1949  BP: 134/74  Pulse: (!) 104  Resp: (!) 22  Temp: (!) 103 F (39.4 C)  SpO2: 100%

## 2020-02-28 NOTE — ED Triage Notes (Signed)
Patient reports fever and abdominal pain starting tonight, was seen at [facility] and they sent him here, he did have abx treatment for syphilis this morning, reports severe lower abdominal pain, more tender in L lower abdomen.

## 2020-02-29 NOTE — ED Notes (Signed)
Pt called x3 for VS reassessment. No answer.

## 2020-02-29 NOTE — ED Provider Notes (Signed)
[place]   [id number] 02/28/20 Arrival Time: 1929  ASSESSMENT & PLAN:  1. Fever, unspecified fever cause   2. Abdominal pain, left lower quadrant     Significant LLQ guarding on exam. With accompanying fever, recommend ED evaluation. He agrees. To ED via private vehicle. Stable upon discharge.    Follow-up Information    Go to  [facility].   Specialty: Emergency Medicine Contact information: [address] [id number] [place] [phone]               Reviewed expectations re: course of current medical issues. Questions answered. Outlined signs and symptoms indicating need for more acute intervention. Patient verbalized understanding. After Visit Summary given.   SUBJECTIVE: History from: patient. Robert Park is a 70 y.o. male who presents with complaint of non-radiating lower abdominal discomfort and subj fever. Abrupt onset today. Ambulatory without difficulty. Received IM PCN to tx syphilis today; questions relation. Mild nausea without emesis. No urinary symptoms.  Past Surgical History:  Procedure Laterality Date  . CARPAL TUNNEL RELEASE Right    x 2  . CARPAL TUNNEL RELEASE Left    x 2  . CATARACT EXTRACTION, BILATERAL Bilateral 10/2017  . COLONOSCOPY    . HEMORRHOID SURGERY    . MOUTH SURGERY    . TONSILLECTOMY AND ADENOIDECTOMY     age 30  . UPPER GASTROINTESTINAL ENDOSCOPY    . VASECTOMY       OBJECTIVE:  Vitals:   02/28/20 1949  BP: 134/74  Pulse: (!) 104  Resp: (!) 22  Temp: (!) 103 F (39.4 C)  TempSrc: Oral  SpO2: 100%   Abnormal VS noted.  General appearance: alert, oriented HEENT: [place]; AT; oropharynx moist Lungs: unlabored respirations Abdomen: soft; without distention; significant TTP over LLQ with involuntary guarding; without appreciable masses or organomegaly Back: without reported CVA tenderness; FROM at waist Extremities: without LE  edema; symmetrical; without gross deformities Skin: warm and dry Neurologic: normal gait Psychological: alert and cooperative; normal mood and affect   Allergies  Allergen Reactions  . Elemental Sulfur   . Prednisone   . Statins     Muscle pains, GI symptoms                                               Past Medical History:  Diagnosis Date  . Allergy   . Anxiety   . Arthritis   . Cataract    Bilateral eyes  . 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 Depression   . Diverticulosis   . Dyslipidemia   . ED (erectile dysfunction)   . GERD (gastroesophageal reflux disease)   . History of colon polyps   . HIV infection ([place])   . Hyperlipidemia   . Hypertension   . Internal hemorrhoids   . Osteopenia   . Shingles   . Sleep apnea    does not wear a c-pap  . Ulcerative colitis ([place])   . Vitamin D deficiency     Social History   Socioeconomic History  . Marital status: Divorced    Spouse name: Not on file  . Number of children: 2  . Years of education: Not on file  . Highest education level: Not on file  Occupational History  . Occupation: retired Financial planner  . Occupation: diabled  Tobacco Use  .  Smoking status: Former Smoker    Types: Cigarettes    Quit date: 06/06/1987    Years since quitting: 32.7  . Smokeless tobacco: Never Used  . Tobacco comment: quit 32 years ago  Vaping Use  . Vaping Use: Never used  Substance and Sexual Activity  . Alcohol use: Yes    Comment: every other day -social  . Drug use: Yes    Types: Marijuana    Comment: occassional usage - last smoked 3 months ago  . Sexual activity: Yes    Partners: Male  Other Topics Concern  . Not on file  Social History Narrative  . Not on file   Social Determinants of Health   Financial Resource Strain: Not on file  Food Insecurity: Not on file  Transportation Needs: Not on file  Physical Activity: Not on file  Stress: Not on file  Social Connections: Not on file  Intimate Partner Violence: Not on  file    Family History  Problem Relation Age of Onset  . Colon cancer Mother 62  . Heart failure Mother   . Cervical cancer Mother   . Leukemia Mother   . Diabetes Mother   . Lung cancer Father   . Diabetes Sister   . Diverticulitis Brother   . Diabetes Maternal Grandmother   . Diabetes Brother   . Hypertension Brother   . Hypertension Sister   . Post-traumatic stress disorder Son   . Hypertension Son   . Hyperlipidemia Son   . Anxiety disorder Son   . Esophageal cancer Neg Hx   . Rectal cancer Neg Hx   . Stomach cancer Neg Hx      Vanessa Kick, MD 02/29/20 1102

## 2020-03-04 ENCOUNTER — Other Ambulatory Visit: Payer: Self-pay

## 2020-03-04 ENCOUNTER — Encounter: Payer: Self-pay | Admitting: Family

## 2020-03-04 ENCOUNTER — Ambulatory Visit: Payer: Medicare Other | Admitting: Family

## 2020-03-04 VITALS — BP 125/78 | HR 69 | Temp 99.2°F | Ht 67.0 in | Wt 223.4 lb

## 2020-03-04 DIAGNOSIS — Z23 Encounter for immunization: Secondary | ICD-10-CM | POA: Diagnosis not present

## 2020-03-04 DIAGNOSIS — B20 Human immunodeficiency virus [HIV] disease: Secondary | ICD-10-CM | POA: Diagnosis not present

## 2020-03-04 DIAGNOSIS — Z0000 Encounter for general adult medical examination without abnormal findings: Secondary | ICD-10-CM

## 2020-03-04 MED ORDER — RILPIVIRINE HCL 25 MG PO TABS: 25.0000 mg | ORAL_TABLET | Freq: Every day | ORAL | 0 refills | Status: DC

## 2020-03-04 MED ORDER — CABOTEGRAVIR SODIUM 30 MG PO TABS
1.0000 | ORAL_TABLET | Freq: Every day | ORAL | 0 refills | Status: DC
Start: 2020-03-04 — End: 2020-04-09

## 2020-03-04 MED ORDER — CABOTEGRAVIR & RILPIVIRINE ER 600 & 900 MG/3ML IM SUER: 1.0000 | Freq: Once | INTRAMUSCULAR | 0 refills | Status: DC

## 2020-03-04 NOTE — Assessment & Plan Note (Signed)
Robert Park continues to have well-controlled HIV disease with good adherence and tolerance to his ART regimen of Genvoya.  No signs/symptoms of opportunistic infection or progressive HIV disease.  Reviewed lab work and discussed plan of care. Interested in changing to injectable Cabenuva twice monthly. He is qualified through [facility] and will submit prescription for Cabenuva. We reviewed the risks, benefits, side effects, and potential costs and he would like to proceed.  He will continue Genvoya in the meantime while awaiting oral medication lead-in. Plan for follow up in 1 month after starting oral lead-in.

## 2020-03-04 NOTE — Assessment & Plan Note (Signed)
   Due for routine dental care with [facility] clinic information provided in after visit summary.  Pneumovax updated today.  Discussed importance of safe sexual practice to reduce risk of STI.  Condoms declined.

## 2020-03-04 NOTE — Progress Notes (Signed)
Subjective:    Patient ID: Robert Park, male    DOB: Mar 01, 1950, 70 y.o.   MRN: [id number]  Chief Complaint  Patient presents with  . Follow-up     HPI:  Robert Park is a 70 y.o. male with HIV disease last seen on 09/02/19 with well-controlled virus and good adherence and tolerance to his ART regimen of Genvoya. Viral load at the time was undetectable with CD4 count of 1097. Most recent blood work completed on 02/23/2020 with viral load that remains undetectable and CD4 count of 1011. RPR previously nonreactive on 05/16/2019. No reactive at 1: 32. Treated on 02/28/2020 with 2,400,000 units of Bicillin once. Here today for routine follow-up.  Robert Park continues to take his Genvoya daily as prescribed with no adverse side effects or missed doses since his last office visit.  Was seen in the emergency room for abdominal pain and fever which resolved during his wait and left without being seen.  Overall feeling well today with no new concerns/complaints.  He is interested in the injectable medication. Denies fevers, chills, night sweats, headaches, changes in vision, neck pain/stiffness, nausea, diarrhea, vomiting, lesions or rashes.  Robert Park has no problems obtaining his medication from the pharmacy and remains covered [place] healthcare Medicare supplemented with SPAP.  Denies feelings of being down, depressed, or hopeless recently.  Drinks alcohol socially with marijuana usage on occasion and no current tobacco use.  Due for routine dental care.  Due for Pneumovax vaccination.  Considering a fourth dose of Covid vaccine due to use of Humira. Condoms declined.   Allergies  Allergen Reactions  . Elemental Sulfur   . Prednisone   . Statins     Muscle pains, GI symptoms      Outpatient Medications Prior to Visit  Medication Sig Dispense Refill  . Adalimumab (HUMIRA) 40 MG/0.4ML PSKT Inject into the skin.    Marland Kitchen aspirin EC 81 MG tablet Take 1 tablet (81 mg total) by mouth daily. Swallow  whole. 150 tablet 2  . escitalopram (LEXAPRO) 20 MG tablet Take 1 tablet (20 mg total) by mouth daily. 90 tablet 0  . ezetimibe (ZETIA) 10 MG tablet Take 1 tablet (10 mg total) by mouth daily. 90 tablet 0  . fluticasone (FLONASE) 50 MCG/ACT nasal spray Place 1 spray into both nostrils daily. 16 g 2  . ibuprofen (ADVIL) 200 MG tablet Take 200 mg by mouth every 6 (six) hours as needed.    Marland Kitchen losartan (COZAAR) 50 MG tablet Take 1 tablet (50 mg total) by mouth daily. 90 tablet 0  . Multiple Vitamins-Minerals (CENTRUM SILVER 50+MEN) TABS Take by mouth. Three times per week    . oxymetazoline (AFRIN NASAL SPRAY) 0.05 % nasal spray Place 1 spray into both nostrils 2 (two) times daily as needed for congestion (Do not use for more than 3 consecutive days). 30 mL 0  . tadalafil (CIALIS) 10 MG tablet Take 0.5 tablets (5 mg total) by mouth daily as needed for erectile dysfunction. 20 tablet 0  . Vitamin D, Ergocalciferol, (DRISDOL) 1.25 MG (50000 UNIT) CAPS capsule TAKE 1 CAPSULE BY MOUTH EVERY 7 DAYS 12 capsule 2  . elvitegravir-cobicistat-emtricitabine-tenofovir (GENVOYA) 150-150-200-10 MG TABS tablet Take 1 tablet by mouth daily with breakfast. 30 tablet 5   Facility-Administered Medications Prior to Visit  Medication Dose Route Frequency Provider Last Rate Last Admin  . penicillin g benzathine (BICILLIN LA) 1200000 UNIT/2ML injection 1.2 Million Units  1.2 Million Units Intramuscular Once Golden Circle, FNP      .  penicillin g benzathine (BICILLIN LA) 1200000 UNIT/2ML injection 1.2 Million Units  1.2 Million Units Intramuscular Once Golden Circle, FNP         Past Medical History:  Diagnosis Date  . Allergy   . Anxiety   . Arthritis   . Cataract    Bilateral eyes  . 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 Depression   . Diverticulosis   . Dyslipidemia   . ED (erectile dysfunction)   . GERD (gastroesophageal reflux disease)   . History of colon polyps   . HIV infection ([place])   . Hyperlipidemia   .  Hypertension   . Internal hemorrhoids   . Osteopenia   . Shingles   . Sleep apnea    does not wear a c-pap  . Ulcerative colitis ([place])   . Vitamin D deficiency      Past Surgical History:  Procedure Laterality Date  . CARPAL TUNNEL RELEASE Right    x 2  . CARPAL TUNNEL RELEASE Left    x 2  . CATARACT EXTRACTION, BILATERAL Bilateral 10/2017  . COLONOSCOPY    . HEMORRHOID SURGERY    . MOUTH SURGERY    . TONSILLECTOMY AND ADENOIDECTOMY     age 89  . UPPER GASTROINTESTINAL ENDOSCOPY    . VASECTOMY         Review of Systems  Constitutional: Negative for appetite change, chills, fatigue, fever and unexpected weight change.  Eyes: Negative for visual disturbance.  Respiratory: Negative for cough, chest tightness, shortness of breath and wheezing.   Cardiovascular: Negative for chest pain and leg swelling.  Gastrointestinal: Negative for abdominal pain, constipation, diarrhea, nausea and vomiting.  Genitourinary: Negative for dysuria, flank pain, frequency, genital sores, hematuria and urgency.  Skin: Negative for rash.  Allergic/Immunologic: Negative for immunocompromised state.  Neurological: Negative for dizziness and headaches.      Objective:    BP 125/78   Pulse 69   Temp 99.2 F (37.3 C) (Oral)   Ht 5' 7"  (1.702 m)   Wt 223 lb 6.4 oz (101.3 kg)   BMI 34.99 kg/m  Nursing note and vital signs reviewed.  Physical Exam Constitutional:      General: He is not in acute distress.    Appearance: He is well-developed.  HENT:     Mouth/Throat:     Mouth: Oropharynx is clear and moist.  Eyes:     Conjunctiva/sclera: Conjunctivae normal.  Cardiovascular:     Rate and Rhythm: Normal rate and regular rhythm.     Pulses: Intact distal pulses.     Heart sounds: Normal heart sounds. No murmur heard. No friction rub. No gallop.   Pulmonary:     Effort: Pulmonary effort is normal. No respiratory distress.     Breath sounds: Normal breath sounds. No wheezing or rales.   Chest:     Chest wall: No tenderness.  Abdominal:     General: Bowel sounds are normal.     Palpations: Abdomen is soft.     Tenderness: There is no abdominal tenderness.  Musculoskeletal:     Cervical back: Neck supple.  Lymphadenopathy:     Cervical: No cervical adenopathy.  Skin:    General: Skin is warm and dry.     Findings: No rash.  Neurological:     Mental Status: He is alert and oriented to person, place, and time.  Psychiatric:        Mood and Affect: Mood and affect normal.  Behavior: Behavior normal.        Thought Content: Thought content normal.        Judgment: Judgment normal.      Depression screen [facility] 2/9 02/05/2020 09/01/2019 08/07/2019 02/16/2019  Decreased Interest 0 0 0 0  Down, Depressed, Hopeless 0 0 0 0  PHQ - 2 Score 0 0 0 0       Assessment & Plan:    Patient Active Problem List   Diagnosis Date Noted  . Allergic sinusitis 02/05/2020  . Hypertension 08/07/2019  . Hyperglycemia 08/07/2019  . Hyperlipidemia 08/07/2019  . GAD (generalized anxiety disorder) 08/07/2019  . Localized osteoarthritis of left hand 08/07/2019  . Colon polyps 06/06/2019  . Human immunodeficiency virus (HIV) disease ([place]) 02/16/2019  . Screening for STDs (sexually transmitted diseases) 02/16/2019  . Osteopenia 02/16/2019  . Healthcare maintenance 02/16/2019  . Ulcerative colitis without complications ([place]) 18/84/1660     Problem List Items Addressed This Visit      Other   Human immunodeficiency virus (HIV) disease ([place]) - Primary (Chronic)    Robert Park continues to have well-controlled HIV disease with good adherence and tolerance to his ART regimen of Genvoya.  No signs/symptoms of opportunistic infection or progressive HIV disease.  Reviewed lab work and discussed plan of care. Interested in changing to injectable Cabenuva twice monthly. He is qualified through SPAP and will submit prescription for Cabenuva. We reviewed the risks, benefits, side effects, and  potential costs and he would like to proceed.  He will continue Genvoya in the meantime while awaiting oral medication lead-in. Plan for follow up in 1 month after starting oral lead-in.       Relevant Medications   Cabotegravir Sodium 30 MG TABS   rilpivirine (EDURANT) 25 MG TABS tablet   cabotegravir & rilpivirine ER (CABENUVA) 600 & 900 MG/3ML injection (Start on 04/05/2020)   Healthcare maintenance (Chronic)     Due for routine dental care with [facility] clinic information provided in after visit summary.  Pneumovax updated today.  Discussed importance of safe sexual practice to reduce risk of STI.  Condoms declined.       Other Visit Diagnoses    Need for vaccination with 13-polyvalent pneumococcal conjugate vaccine       Relevant Orders   Pneumococcal conjugate vaccine 13-valent IM (Completed)       I have discontinued Hoy Register "[place]. I am also having him start on Cabotegravir Sodium, rilpivirine, and cabotegravir & rilpivirine ER. Additionally, I am having him maintain his Humira, Vitamin D (Ergocalciferol), Centrum Silver 50+Men, aspirin EC, losartan, ezetimibe, escitalopram, fluticasone, Afrin Nasal Spray, tadalafil, and ibuprofen. We will continue to administer penicillin g benzathine and penicillin g benzathine.   Meds ordered this encounter  Medications  . Cabotegravir Sodium 30 MG TABS    Sig: Take 1 tablet by mouth daily.    Dispense:  30 tablet    Refill:  0    Order Specific Question:   Supervising Provider    Answer:   Carlyle Basques [[id number]]  . rilpivirine (EDURANT) 25 MG TABS tablet    Sig: Take 1 tablet (25 mg total) by mouth daily with breakfast.    Dispense:  30 tablet    Refill:  0    Order Specific Question:   Supervising Provider    Answer:   Carlyle Basques [[id number]]  . cabotegravir & rilpivirine ER (CABENUVA) 600 & 900 MG/3ML injection    Sig: Inject 1 kit into the muscle once  for 1 dose.    Dispense:  6 mL    Refill:  0    Order Specific  Question:   Supervising Provider    Answer:   Carlyle Basques [[id number]]     Follow-up: Return in about 1 month (around 04/01/2020), or if symptoms worsen or fail to improve.   Terri Piedra, MSN, FNP-C Nurse Practitioner [facility] for Infectious Disease [place] number: [phone]

## 2020-03-04 NOTE — Patient Instructions (Addendum)
Nice to see you.   Continue to take your [employer] daily as prescribed.   Refills are at the pharmacy.  Please call [facility]) to schedule/follow up on your dental care at [phone]  We will get your [facility] prescription paperwork started.   We will schedule follow up after initiation of medication.  Have a great day and be safe!

## 2020-03-06 ENCOUNTER — Other Ambulatory Visit: Payer: Self-pay

## 2020-03-06 ENCOUNTER — Telehealth: Payer: Self-pay

## 2020-03-06 NOTE — Telephone Encounter (Signed)
[facility] Patient Advocate Encounter  Patient's medications have been delivered to [facility] from [facility] and will be picked up 03/08/20.  Robert Park , [name] Patient [facility] for Infectious Disease Phone: [phone] Fax:  [phone]

## 2020-03-12 ENCOUNTER — Other Ambulatory Visit: Payer: Self-pay | Admitting: Family

## 2020-03-12 MED ORDER — CABOTEGRAVIR & RILPIVIRINE ER 600 & 900 MG/3ML IM SUER: 1.0000 | Freq: Once | INTRAMUSCULAR | 0 refills | Status: DC

## 2020-03-14 ENCOUNTER — Other Ambulatory Visit: Payer: Self-pay | Admitting: Family

## 2020-04-03 MED FILL — CABENUVA 600 & 900 MG/3ML S: 600 & 900 | 30 days supply | Qty: 6 | Fill #0

## 2020-04-04 ENCOUNTER — Telehealth: Payer: Self-pay

## 2020-04-04 MED ORDER — VITAMIN D (ERGOCALCIFEROL) 1.25 MG (50000 UNIT) PO CAPS: 50000.0000 [IU] | ORAL_CAPSULE | ORAL | 2 refills | Status: DC

## 2020-04-04 NOTE — Telephone Encounter (Signed)
[facility] Patient Advocate Encounter   I was successful in securing patient a $7500 grant from [name] (PAF) to provide copayment coverage for Robert Park.  This will make the out of pocket cost $0.00.     I have spoken with the patient.    The billing information is as follows and has been shared with [place].         Patient knows to call the office with questions or concerns.  Robert Park, [place] [facility] Patient [facility] for Infectious Disease Phone: [phone] Fax:  [phone]

## 2020-04-04 NOTE — Telephone Encounter (Signed)
-----   Message from Golden Circle, [place] sent at 04/04/2020 11:20 AM EDT ----- Regarding: RE: Vitamin D 50,000 units Yes - okay to refill  ----- Message ----- From: Eugenia Mcalpine, LPN Sent: 6/81/2751  10:32 AM EDT To: Golden Circle, FNP Subject: FW: Vitamin D 50,000 units                     Ok to refill? ----- Message ----- From: Roney Jaffe, CPhT Sent: 04/04/2020   9:24 AM EDT To: Eugenia Mcalpine, LPN Subject: Vitamin D 50,000 units                         Hello Robert Park ,  I spoke to this patient today he said [facility] ([facility] sent in several faxes for his Vit d but they havent heard back from the Dr yet and he has been out of it. Would you be able to send them a script for his Vitamin d to [facility]?     Thank You,  [name], [place] Patient [facility] for Infectious Disease Phone: [phone] Fax: [phone]

## 2020-04-05 ENCOUNTER — Telehealth: Payer: Self-pay

## 2020-04-05 NOTE — Telephone Encounter (Signed)
[facility] Patient Advocate Encounter  Patient's medication(Cabenuva) have been couriered to [facility] from [place] and will be administered on patient next appointment on 04/09/20.  [name] , [place] [facility] Patient [facility] for Infectious Disease Phone: [phone] Fax:  [phone]

## 2020-04-09 ENCOUNTER — Ambulatory Visit (INDEPENDENT_AMBULATORY_CARE_PROVIDER_SITE_OTHER): Payer: Medicare Other | Admitting: Family

## 2020-04-09 ENCOUNTER — Encounter: Payer: Self-pay | Admitting: Family

## 2020-04-09 ENCOUNTER — Other Ambulatory Visit: Payer: Self-pay | Admitting: Family

## 2020-04-09 ENCOUNTER — Other Ambulatory Visit: Payer: Self-pay

## 2020-04-09 VITALS — Wt 225.0 lb

## 2020-04-09 DIAGNOSIS — B20 Human immunodeficiency virus [HIV] disease: Secondary | ICD-10-CM

## 2020-04-09 MED ORDER — CABOTEGRAVIR & RILPIVIRINE ER 600 & 900 MG/3ML IM SUER: 1.0000 | Freq: Once | INTRAMUSCULAR | 6 refills | Status: DC

## 2020-04-09 MED ORDER — CABOTEGRAVIR & RILPIVIRINE ER 600 & 900 MG/3ML IM SUER
1.0000 | Freq: Once | INTRAMUSCULAR | Status: AC
  Administered 2020-04-09: 1 via INTRAMUSCULAR

## 2020-04-09 NOTE — Patient Instructions (Addendum)
Nice to see you.  We will check your lab work today.   Plan for follow up in 1 month or sooner if needed.   Have a great day and stay safe!

## 2020-04-09 NOTE — Assessment & Plan Note (Signed)
Robert Park completed his oral lead-in with Cabotegravir and Rilpivirne with no adverse side effects.  Reviewed plan of care to include injection today followed by injection in 1 month and then every 2 months thereafter.  Check lab work today.  Plan for follow-up in 1 month or sooner if needed.

## 2020-04-09 NOTE — Progress Notes (Signed)
Subjective:    Patient ID: Robert Park, male    DOB: 02/21/1950, 70 y.o.   MRN: [id number]  Chief Complaint  Patient presents with  . Follow-up    Cabenuva first injection appt;      HPI:  Robert Park is a 70 y.o. male with HIV disease last seen on 03/04/2020 with well-controlled virus and good adherence and tolerance to his ART regimen of Genvoya.  Viral load at the time was undetectable with CD4 count of 1011.  RPR was noted to be positive at 1: 32 and treated for early latent syphilis with 2,400,000 units of Bicillin.  Started on oral lead-in for Cabenuva.  Here today for first injection.  Robert Park completed his first month of oral lead-in with no adverse side effects or missed doses.  Overall feeling well today with no new concerns/complaints. Denies fevers, chills, night sweats, headaches, changes in vision, neck pain/stiffness, nausea, diarrhea, vomiting, lesions or rashes.   Allergies  Allergen Reactions  . Elemental Sulfur   . Prednisone   . Statins     Muscle pains, GI symptoms      Outpatient Medications Prior to Visit  Medication Sig Dispense Refill  . Adalimumab (HUMIRA) 40 MG/0.4ML PSKT Inject into the skin.    Marland Kitchen aspirin EC 81 MG tablet Take 1 tablet (81 mg total) by mouth daily. Swallow whole. 150 tablet 2  . escitalopram (LEXAPRO) 20 MG tablet Take 1 tablet (20 mg total) by mouth daily. 90 tablet 0  . ezetimibe (ZETIA) 10 MG tablet Take 1 tablet (10 mg total) by mouth daily. 90 tablet 0  . fluticasone (FLONASE) 50 MCG/ACT nasal spray Place 1 spray into both nostrils daily. 16 g 2  . ibuprofen (ADVIL) 200 MG tablet Take 200 mg by mouth every 6 (six) hours as needed.    Marland Kitchen losartan (COZAAR) 50 MG tablet Take 1 tablet (50 mg total) by mouth daily. 90 tablet 0  . Multiple Vitamins-Minerals (CENTRUM SILVER 50+MEN) TABS Take by mouth. Three times per week    . oxymetazoline (AFRIN NASAL SPRAY) 0.05 % nasal spray Place 1 spray into both nostrils 2 (two) times daily  as needed for congestion (Do not use for more than 3 consecutive days). 30 mL 0  . tadalafil (CIALIS) 10 MG tablet Take 0.5 tablets (5 mg total) by mouth daily as needed for erectile dysfunction. 20 tablet 0  . Vitamin D, Ergocalciferol, (DRISDOL) 1.25 MG (50000 UNIT) CAPS capsule Take 1 capsule (50,000 Units total) by mouth every 7 (seven) days. 12 capsule 2  . Cabotegravir Sodium 30 MG TABS Take 1 tablet by mouth daily. 30 tablet 0  . rilpivirine (EDURANT) 25 MG TABS tablet Take 1 tablet (25 mg total) by mouth daily with breakfast. 30 tablet 0  . penicillin g benzathine (BICILLIN LA) 1200000 UNIT/2ML injection 1.2 Million Units     . penicillin g benzathine (BICILLIN LA) 1200000 UNIT/2ML injection 1.2 Million Units      No facility-administered medications prior to visit.     Past Medical History:  Diagnosis Date  . Allergy   . Anxiety   . Arthritis   . Cataract    Bilateral eyes  . 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 Depression   . Diverticulosis   . Dyslipidemia   . ED (erectile dysfunction)   . GERD (gastroesophageal reflux disease)   . History of colon polyps   . HIV infection ([place])   . Hyperlipidemia   . Hypertension   .  Internal hemorrhoids   . Osteopenia   . Shingles   . Sleep apnea    does not wear a c-pap  . Ulcerative colitis ([place])   . Vitamin D deficiency      Past Surgical History:  Procedure Laterality Date  . CARPAL TUNNEL RELEASE Right    x 2  . CARPAL TUNNEL RELEASE Left    x 2  . CATARACT EXTRACTION, BILATERAL Bilateral 10/2017  . COLONOSCOPY    . HEMORRHOID SURGERY    . MOUTH SURGERY    . TONSILLECTOMY AND ADENOIDECTOMY     age 60  . UPPER GASTROINTESTINAL ENDOSCOPY    . VASECTOMY         Review of Systems  Constitutional: Negative for appetite change, chills, fatigue, fever and unexpected weight change.  Eyes: Negative for visual disturbance.  Respiratory: Negative for cough, chest tightness, shortness of breath and wheezing.   Cardiovascular:  Negative for chest pain and leg swelling.  Gastrointestinal: Negative for abdominal pain, constipation, diarrhea, nausea and vomiting.  Genitourinary: Negative for dysuria, flank pain, frequency, genital sores, hematuria and urgency.  Skin: Negative for rash.  Allergic/Immunologic: Negative for immunocompromised state.  Neurological: Negative for dizziness and headaches.      Objective:    Wt 225 lb (102.1 kg)   BMI 35.24 kg/m  Nursing note and vital signs reviewed.  Physical Exam Constitutional:      General: He is not in acute distress.    Appearance: He is well-developed.  Eyes:     Conjunctiva/sclera: Conjunctivae normal.  Cardiovascular:     Rate and Rhythm: Normal rate and regular rhythm.     Heart sounds: Normal heart sounds. No murmur heard. No friction rub. No gallop.   Pulmonary:     Effort: Pulmonary effort is normal. No respiratory distress.     Breath sounds: Normal breath sounds. No wheezing or rales.  Chest:     Chest wall: No tenderness.  Abdominal:     General: Bowel sounds are normal.     Palpations: Abdomen is soft.     Tenderness: There is no abdominal tenderness.  Musculoskeletal:     Cervical back: Neck supple.  Lymphadenopathy:     Cervical: No cervical adenopathy.  Skin:    General: Skin is warm and dry.     Findings: No rash.  Neurological:     Mental Status: He is alert and oriented to person, place, and time.  Psychiatric:        Behavior: Behavior normal.        Thought Content: Thought content normal.        Judgment: Judgment normal.      Depression screen [facility] 2/9 04/09/2020 02/05/2020 09/01/2019 08/07/2019 02/16/2019  Decreased Interest 0 0 0 0 0  Down, Depressed, Hopeless 0 0 0 0 0  PHQ - 2 Score 0 0 0 0 0       Assessment & Plan:    Patient Active Problem List   Diagnosis Date Noted  . Allergic sinusitis 02/05/2020  . Hypertension 08/07/2019  . Hyperglycemia 08/07/2019  . Hyperlipidemia 08/07/2019  . GAD (generalized anxiety  disorder) 08/07/2019  . Localized osteoarthritis of left hand 08/07/2019  . Colon polyps 06/06/2019  . Human immunodeficiency virus (HIV) disease ([place]) 02/16/2019  . Screening for STDs (sexually transmitted diseases) 02/16/2019  . Osteopenia 02/16/2019  . Healthcare maintenance 02/16/2019  . Ulcerative colitis without complications ([place]) 70/35/0093     Problem List Items Addressed This Visit  Other   Human immunodeficiency virus (HIV) disease ([place]) - Primary (Chronic)    Mr. Dibert completed his oral lead-in with Cabotegravir and Rilpivirne with no adverse side effects.  Reviewed plan of care to include injection today followed by injection in 1 month and then every 2 months thereafter.  Check lab work today.  Plan for follow-up in 1 month or sooner if needed.      Relevant Medications   cabotegravir & rilpivirine ER (CABENUVA) 600 & 900 MG/3ML injection   Other Relevant Orders   HIV-1 RNA quant-no reflex-bld   T-helper cell (CD4)- ([facility] clinic only)       I have discontinued Hoy Register "Bill"'s Cabotegravir Sodium and rilpivirine. I am also having him start on cabotegravir & rilpivirine ER. Additionally, I am having him maintain his Humira, Centrum Silver 50+Men, aspirin EC, losartan, ezetimibe, escitalopram, fluticasone, Afrin Nasal Spray, tadalafil, ibuprofen, and Vitamin D (Ergocalciferol). We will stop administering penicillin g benzathine and penicillin g benzathine. Additionally, we administered cabotegravir & rilpivirine ER.   Meds ordered this encounter  Medications  . cabotegravir & rilpivirine ER (CABENUVA) 600 & 900 MG/3ML injection    Sig: Inject 1 kit into the muscle once for 1 dose.    Dispense:  6 mL    Refill:  6    Order Specific Question:   Supervising Provider    Answer:   Carlyle Basques [[id number]]  . cabotegravir & rilpivirine ER (CABENUVA) 600 & 900 MG/3ML injection 1 kit     Follow-up: Return in about 1 month (around 05/10/2020), or if symptoms  worsen or fail to improve.   Terri Piedra, MSN, FNP-C Nurse Practitioner [facility] for Infectious Disease [place] number: [phone]

## 2020-04-10 LAB — T-HELPER CELL (CD4) - (RCID CLINIC ONLY)
CD4 % Helper T Cell: 34 % (ref 33–65)
CD4 T Cell Abs: 1261 /uL (ref 400–1790)

## 2020-04-11 LAB — HIV-1 RNA QUANT-NO REFLEX-BLD
HIV 1 RNA Quant: NOT DETECTED Copies/mL
HIV-1 RNA Quant, Log: NOT DETECTED Log cps/mL

## 2020-04-16 ENCOUNTER — Other Ambulatory Visit (HOSPITAL_COMMUNITY): Payer: Self-pay

## 2020-04-30 ENCOUNTER — Other Ambulatory Visit (HOSPITAL_COMMUNITY): Payer: Self-pay

## 2020-04-30 MED FILL — Cabotegravir 600 MG/3ML & Rilpivirine 900 MG/3ML IM Susp ER: INTRAMUSCULAR | 30 days supply | Qty: 6 | Fill #0 | Status: AC

## 2020-05-01 ENCOUNTER — Other Ambulatory Visit (HOSPITAL_COMMUNITY): Payer: Self-pay

## 2020-05-02 ENCOUNTER — Other Ambulatory Visit (HOSPITAL_COMMUNITY): Payer: Self-pay

## 2020-05-03 ENCOUNTER — Other Ambulatory Visit (HOSPITAL_COMMUNITY): Payer: Self-pay

## 2020-05-06 ENCOUNTER — Telehealth: Payer: Self-pay

## 2020-05-06 NOTE — Telephone Encounter (Signed)
[facility] Patient Advocate Encounter  Patient's medication [place] Reap) have been couriered to [facility] from [place] and will be administered on patient next appointment on 05/10/20  [name] , [place] Patient [facility] for Infectious Disease Phone: [phone] Fax:  [phone]

## 2020-05-07 ENCOUNTER — Encounter: Payer: Self-pay | Admitting: *Deleted

## 2020-05-07 NOTE — Progress Notes (Unsigned)

## 2020-05-08 NOTE — Progress Notes (Unsigned)
Things That May Be Affecting Your Health:  Alcohol x Hearing loss  Pain    Depression  Home Safety  Sexual Health   Diabetes  Lack of physical activity  Stress   Difficulty with daily activities  Loneliness  Tiredness   Drug use x Medicines  Tobacco use   Falls  Motor Vehicle Safety  Weight   Food choices  Oral Health  Other    YOUR PERSONALIZED HEALTH PLAN : 1. Schedule your next subsequent Medicare Wellness visit in one year 2. Attend all of your regular appointments to address your medical issues 3. Complete the preventative screenings and services   Annual Wellness Visit   Medicare Covered Preventative Screenings and [place] Men and Women Who How Often Need? Date of Last Service Action  Abdominal Aortic Aneurysm Adults with AAA risk factors Once      Alcohol Misuse and Counseling All Adults Screening once a year if no alcohol misuse. Counseling up to 4 face to face sessions.     Bone Density Measurement  Adults at risk for osteoporosis Once every 2 yrs      Lipid Panel Z13.6 All adults without CV disease Once every 5 yrs       Colorectal Cancer   Stool sample or  Colonoscopy All adults 7 and older   Once every year  Every 10 years        Depression All Adults Once a year  Today   Diabetes Screening Blood glucose, post glucose load, or GTT Z13.1  All adults at risk  Pre-diabetics  Once per year  Twice per year      Diabetes  Self-Management Training All adults Diabetics 10 hrs first year; 2 hours subsequent years. Requires Copay     Glaucoma  Diabetics  Family history of glaucoma  African Americans 72 yrs +  Hispanic Americans 45 yrs + Annually - requires coppay      Hepatitis C Z72.89 or F19.20  High Risk for HCV  Born between 1945 and 1965  Annually  Once      HIV Z11.4 All adults based on risk  Annually btw ages 57 & 65 regardless of risk  Annually > 65 yrs if at increased risk      Lung Cancer Screening  Asymptomatic adults aged 50-77 with 30 pack yr history and current smoker OR quit within the last 15 yrs Annually Must have counseling and shared decision making documentation before first screen      Medical Nutrition Therapy Adults with   Diabetes  Renal disease  Kidney transplant within past 3 yrs 3 hours first year; 2 hours subsequent years     Obesity and Counseling All adults Screening once a year Counseling if BMI 30 or higher  Today   Tobacco Use Counseling Adults who use tobacco  Up to 8 visits in one year     Vaccines Z23  Hepatitis B  Influenza   Pneumonia  Adults   Once  Once every flu season  Two different vaccines separated by one year     Next Annual Wellness Visit People with Medicare Every year  Today     Services & Screenings Women Who How Often Need  Date of Last Service Action  Mammogram  Z12.31 Women over 65 One baseline ages 102-39. Annually ager 40 yrs+      Pap tests All women Annually if high risk. Every 2 yrs for normal risk women  Screening for cervical cancer with   Pap (Z01.419 nl or Z01.411abnl) &  HPV Z11.51 Women aged 81 to 48 Once every 5 yrs     Screening pelvic and breast exams All women Annually if high risk. Every 2 yrs for normal risk women     Sexually Transmitted Diseases  Chlamydia  Gonorrhea  Syphilis All at risk adults Annually for non pregnant females at increased risk         [place] Men Who How Ofter Need  Date of Last Service Action  Prostate Cancer - DRE & PSA Men over 50 Annually.  DRE might require a copay.        Sexually Transmitted Diseases  Syphilis All at risk adults Annually for men at increased risk      Health Maintenance List Health Maintenance  Topic Date Due  . COVID-19 Vaccine (4 - Booster for Pfizer series) 04/13/2020  . INFLUENZA VACCINE  08/12/2020  . PNA vac Low Risk Adult (2 of 2 - PPSV23) 03/04/2021  . COLONOSCOPY (Pts 45-15yr Insurance coverage will need to be  confirmed)  06/20/2021  . TETANUS/TDAP  02/20/2024  . Hepatitis C Screening  Completed  . HPV VACCINES  Aged Out

## 2020-05-10 ENCOUNTER — Other Ambulatory Visit: Payer: Self-pay

## 2020-05-10 ENCOUNTER — Encounter: Payer: Self-pay | Admitting: Family

## 2020-05-10 ENCOUNTER — Ambulatory Visit: Payer: Medicare Other | Admitting: Family

## 2020-05-10 VITALS — BP 138/91 | HR 76 | Temp 98.4°F | Wt 227.0 lb

## 2020-05-10 DIAGNOSIS — B20 Human immunodeficiency virus [HIV] disease: Secondary | ICD-10-CM | POA: Diagnosis not present

## 2020-05-10 DIAGNOSIS — Z0000 Encounter for general adult medical examination without abnormal findings: Secondary | ICD-10-CM

## 2020-05-10 DIAGNOSIS — Z23 Encounter for immunization: Secondary | ICD-10-CM | POA: Diagnosis not present

## 2020-05-10 MED ORDER — CABOTEGRAVIR & RILPIVIRINE ER 600 & 900 MG/3ML IM SUER
1.0000 | Freq: Once | INTRAMUSCULAR | Status: AC
  Administered 2020-05-10: 1 via INTRAMUSCULAR

## 2020-05-10 NOTE — Patient Instructions (Addendum)
Nice to see you.  We will check your lab work today.  Plan for follow up in 2 months or sooner if needed with lab work on the same day.  Have a great day and stay safe!

## 2020-05-10 NOTE — Assessment & Plan Note (Signed)
   [place] booster updated today.  Discussed importance of safe sexual practice to reduce risk of STI. Condoms declined.

## 2020-05-10 NOTE — Progress Notes (Signed)
   Covid-19 Vaccination Clinic  Name:  Robert Park    MRN: [id number] DOB: Feb 21, 1950  05/10/2020  Mr. Robert Park was observed post Covid-19 immunization for 15 minutes without incident. He was provided with Vaccine Information Sheet and instruction to access the V-Safe system.   Mr. Robert Park was instructed to call 911 with any severe reactions post vaccine: Marland Kitchen Difficulty breathing  . Swelling of face and throat  . A fast heartbeat  . A bad rash all over body  . Dizziness and weakness     Zalayah Pizzuto T Brooks Sailors

## 2020-05-10 NOTE — Assessment & Plan Note (Signed)
Robert Park has done well after his first injection of Cabenuva with some soreness and no other adverse side effects. Discussed plan of care. Check HIV RNA level today. Plan for next injection in 2 months.

## 2020-05-10 NOTE — Progress Notes (Signed)
Patient ID: Robert Park, male    DOB: 1950-07-19, 70 y.o.   MRN: [id number]    Subjective:    Chief Complaint  Patient presents with  . Follow-up    B20-Cabenuva      HPI:  Robert Park is a 70 y.o. male with HIV disease last seen on 04/09/20 for first injection of Cabenuva. Viral load at the time was undetectable and CD4 count was 1,261. Here today for second Cabenuva injection.  Robert Park has been doing well since his last office visit. Had some stiffness and soreness following injections which lasted for a couple of days otherwise no adverse side effects. Feeling well today with the some allergy symptoms secondary to the pollen. Denies fevers, chills, night sweats, headaches, changes in vision, neck pain/stiffness, nausea, diarrhea, vomiting, lesions or rashes.   Allergies  Allergen Reactions  . Elemental Sulfur   . Prednisone   . Statins     Muscle pains, GI symptoms      Outpatient Medications Prior to Visit  Medication Sig Dispense Refill  . Adalimumab (HUMIRA) 40 MG/0.4ML PSKT Inject into the skin.    Marland Kitchen aspirin EC 81 MG tablet Take 1 tablet (81 mg total) by mouth daily. Swallow whole. 150 tablet 2  . cabotegravir & rilpivirine ER (CABENUVA) 600 & 900 MG/3ML injection INJECT 1 KIT INTO THE MUSCLE ONCE FOR 1 DOSE. 6 mL 6  . cabotegravir & rilpivirine ER (CABENUVA) 600 & 900 MG/3ML injection INJECT 1 KIT INTO THE MUSCLE ONCE FOR 1 DOSE. 6 mL 0  . escitalopram (LEXAPRO) 20 MG tablet Take 1 tablet (20 mg total) by mouth daily. 90 tablet 0  . ezetimibe (ZETIA) 10 MG tablet Take 1 tablet (10 mg total) by mouth daily. 90 tablet 0  . fluticasone (FLONASE) 50 MCG/ACT nasal spray Place 1 spray into both nostrils daily. 16 g 2  . ibuprofen (ADVIL) 200 MG tablet Take 200 mg by mouth every 6 (six) hours as needed.    Marland Kitchen losartan (COZAAR) 50 MG tablet Take 1 tablet (50 mg total) by mouth daily. 90 tablet 0  . Multiple Vitamins-Minerals (CENTRUM SILVER 50+MEN) TABS Take by mouth.  Three times per week    . tadalafil (CIALIS) 10 MG tablet Take 0.5 tablets (5 mg total) by mouth daily as needed for erectile dysfunction. 20 tablet 0  . Vitamin D, Ergocalciferol, (DRISDOL) 1.25 MG (50000 UNIT) CAPS capsule Take 1 capsule (50,000 Units total) by mouth every 7 (seven) days. 12 capsule 2  . oxymetazoline (AFRIN NASAL SPRAY) 0.05 % nasal spray Place 1 spray into both nostrils 2 (two) times daily as needed for congestion (Do not use for more than 3 consecutive days). 30 mL 0   No facility-administered medications prior to visit.     Past Medical History:  Diagnosis Date  . Allergy   . Anxiety   . Arthritis   . Cataract    Bilateral eyes  . 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 Depression   . Diverticulosis   . Dyslipidemia   . ED (erectile dysfunction)   . GERD (gastroesophageal reflux disease)   . History of colon polyps   . HIV infection ([place])   . Hyperlipidemia   . Hypertension   . Internal hemorrhoids   . Osteopenia   . Shingles   . Sleep apnea    does not wear a c-pap  . Ulcerative colitis ([place])   . Vitamin D deficiency      Past Surgical History:  Procedure Laterality Date  . CARPAL TUNNEL RELEASE Right    x 2  . CARPAL TUNNEL RELEASE Left    x 2  . CATARACT EXTRACTION, BILATERAL Bilateral 10/2017  . COLONOSCOPY    . HEMORRHOID SURGERY    . MOUTH SURGERY    . TONSILLECTOMY AND ADENOIDECTOMY     age 5  . UPPER GASTROINTESTINAL ENDOSCOPY    . VASECTOMY         Review of Systems  Constitutional: Negative for appetite change, chills, fatigue, fever and unexpected weight change.  Eyes: Negative for visual disturbance.  Respiratory: Negative for cough, chest tightness, shortness of breath and wheezing.   Cardiovascular: Negative for chest pain and leg swelling.  Gastrointestinal: Negative for abdominal pain, constipation, diarrhea, nausea and vomiting.  Genitourinary: Negative for dysuria, flank pain, frequency, genital sores, hematuria and urgency.  Skin:  Negative for rash.  Allergic/Immunologic: Negative for immunocompromised state.  Neurological: Negative for dizziness and headaches.      Objective:    BP (!) 138/91   Pulse 76   Temp 98.4 F (36.9 C) (Oral)   Wt 227 lb (103 kg)   BMI 35.55 kg/m  Nursing note and vital signs reviewed.  Physical Exam Constitutional:      General: He is not in acute distress.    Appearance: He is well-developed.  Eyes:     Conjunctiva/sclera: Conjunctivae normal.  Cardiovascular:     Rate and Rhythm: Normal rate and regular rhythm.     Heart sounds: Normal heart sounds. No murmur heard. No friction rub. No gallop.   Pulmonary:     Effort: Pulmonary effort is normal. No respiratory distress.     Breath sounds: Normal breath sounds. No wheezing or rales.  Chest:     Chest wall: No tenderness.  Abdominal:     General: Bowel sounds are normal.     Palpations: Abdomen is soft.     Tenderness: There is no abdominal tenderness.  Musculoskeletal:     Cervical back: Neck supple.  Lymphadenopathy:     Cervical: No cervical adenopathy.  Skin:    General: Skin is warm and dry.     Findings: No rash.  Neurological:     Mental Status: He is alert and oriented to person, place, and time.  Psychiatric:        Behavior: Behavior normal.        Thought Content: Thought content normal.        Judgment: Judgment normal.      Depression screen [facility] [place] [facility] 2/9 05/10/2020 04/09/2020 02/05/2020 09/01/2019 08/07/2019  Decreased Interest 0 0 0 0 0  Down, Depressed, Hopeless 0 0 0 0 0  PHQ - 2 Score 0 0 0 0 0       Assessment & Plan:    Patient Active Problem List   Diagnosis Date Noted  . Allergic sinusitis 02/05/2020  . Hypertension 08/07/2019  . Hyperglycemia 08/07/2019  . Hyperlipidemia 08/07/2019  . GAD (generalized anxiety disorder) 08/07/2019  . Localized osteoarthritis of left hand 08/07/2019  . Colon polyps 06/06/2019  . Human immunodeficiency virus (HIV) disease ([place]) 02/16/2019  . Screening  for STDs (sexually transmitted diseases) 02/16/2019  . Osteopenia 02/16/2019  . Healthcare maintenance 02/16/2019  . Ulcerative colitis without complications ([place]) 30/07/6224     Problem List Items Addressed This Visit      Other   Human immunodeficiency virus (HIV) disease ([place]) - Primary (Chronic)    Mr. Henery has done well after his  first injection of Cabenuva with some soreness and no other adverse side effects. Discussed plan of care. Check HIV RNA level today. Plan for next injection in 2 months.       Relevant Orders   HIV 1 RNA quant-no reflex-bld   Healthcare maintenance (Chronic)     Pfizer booster updated today.  Discussed importance of safe sexual practice to reduce risk of STI. Condoms declined.        Other Visit Diagnoses    Encounter for immunization       Relevant Orders   PFIZER Comirnaty(GRAY TOP)COVID-19 Vaccine (Completed)       I am having Hoy Register "Bill" maintain his Humira, Centrum Silver 50+Men, aspirin EC, losartan, ezetimibe, escitalopram, fluticasone, Afrin Nasal Spray, tadalafil, ibuprofen, Vitamin D (Ergocalciferol), cabotegravir & rilpivirine ER, and cabotegravir & rilpivirine ER. We administered cabotegravir & rilpivirine ER.   Follow-up: Return in about 2 months (around 07/10/2020), or if symptoms worsen or fail to improve.   Terri Piedra, MSN, FNP-C Nurse Practitioner [facility] [place] number: [phone]

## 2020-05-13 LAB — HIV-1 RNA QUANT-NO REFLEX-BLD
HIV 1 RNA Quant: NOT DETECTED Copies/mL
HIV-1 RNA Quant, Log: NOT DETECTED Log cps/mL

## 2020-05-14 ENCOUNTER — Other Ambulatory Visit: Payer: Self-pay | Admitting: Student in an Organized Health Care Education/Training Program

## 2020-05-14 DIAGNOSIS — F32A Depression, unspecified: Secondary | ICD-10-CM

## 2020-05-14 DIAGNOSIS — E785 Hyperlipidemia, unspecified: Secondary | ICD-10-CM

## 2020-05-16 ENCOUNTER — Ambulatory Visit (INDEPENDENT_AMBULATORY_CARE_PROVIDER_SITE_OTHER): Payer: Medicare Other | Admitting: Student

## 2020-05-16 ENCOUNTER — Encounter: Payer: Self-pay | Admitting: Student

## 2020-05-16 ENCOUNTER — Other Ambulatory Visit: Payer: Self-pay

## 2020-05-16 VITALS — BP 154/99 | HR 75 | Temp 99.1°F | Ht 67.0 in | Wt 228.9 lb

## 2020-05-16 DIAGNOSIS — Z0000 Encounter for general adult medical examination without abnormal findings: Secondary | ICD-10-CM

## 2020-05-16 NOTE — Patient Instructions (Addendum)
Things That May Be Affecting Your Health:  Alcohol x Hearing loss  Pain    Depression  Home Safety  Sexual Health   Diabetes  Lack of physical activity  Stress   Difficulty with daily activities  Loneliness  Tiredness   Drug use x Medicines  Tobacco use   Falls  Motor Vehicle Safety  Weight   Food choices  Oral Health  Other    YOUR PERSONALIZED HEALTH PLAN : 1. Schedule your next subsequent Medicare Wellness visit in one year 2. Attend all of your regular appointments to address your medical issues 3. Complete the preventative screenings and services 4. Call back to schedule a follow up with Dr. Evette Doffing for ~ 08/04/2020 5. Congratulations on your goal to lose weight (< 200 lbs) 6. Congratulations on your goal to exercise 3 times per week for 40 minutes each time   Annual Wellness Visit                       Medicare Covered Preventative Screenings and [place] Men and Women Who How Often Need? Date of Last Service Action  Abdominal Aortic Aneurysm Adults with AAA risk factors Once      Alcohol Misuse and Counseling All Adults Screening once a year if no alcohol misuse. Counseling up to 4 face to face sessions.     Bone Density Measurement  Adults at risk for osteoporosis Once every 2 yrs      Lipid Panel Z13.6 All adults without CV disease Once every 5 yrs       Colorectal Cancer   Stool sample or  Colonoscopy All adults 62 and older   Once every year  Every 10 years        Depression All Adults Once a year  Today   Diabetes Screening Blood glucose, post glucose load, or GTT Z13.1  All adults at risk  Pre-diabetics  Once per year  Twice per year      Diabetes  Self-Management Training All adults Diabetics 10 hrs first year; 2 hours subsequent years. Requires Copay     Glaucoma  Diabetics  Family history of glaucoma  African Americans 33 yrs +  Hispanic Americans 22 yrs + Annually -  requires coppay      Hepatitis C Z72.89 or F19.20  High Risk for HCV  Born between 1945 and 1965  Annually  Once      HIV Z11.4 All adults based on risk  Annually btw ages 73 & 65 regardless of risk  Annually > 65 yrs if at increased risk      Lung Cancer Screening Asymptomatic adults aged 29-77 with 30 pack yr history and current smoker OR quit within the last 15 yrs Annually Must have counseling and shared decision making documentation before first screen      Medical Nutrition Therapy Adults with   Diabetes  Renal disease  Kidney transplant within past 3 yrs 3 hours first year; 2 hours subsequent years     Obesity and Counseling All adults Screening once a year Counseling if BMI 30 or higher  Today   Tobacco Use Counseling Adults who use tobacco  Up to 8 visits in one year     Vaccines Z23  Hepatitis B  Influenza   Pneumonia  Adults   Once  Once every flu season  Two different vaccines separated by one year     Next Annual Wellness Visit People with Medicare Every  year  Today     Services & Screenings Women Who How Often Need  Date of Last Service Action  Mammogram  Z12.31 Women over 49 One baseline ages 25-39. Annually ager 40 yrs+      Pap tests All women Annually if high risk. Every 2 yrs for normal risk women      Screening for cervical cancer with   Pap (Z01.419 nl or Z01.411abnl) &  HPV Z11.51 Women aged 5 to 53 Once every 5 yrs     Screening pelvic and breast exams All women Annually if high risk. Every 2 yrs for normal risk women     Sexually Transmitted Diseases  Chlamydia  Gonorrhea  Syphilis All at risk adults Annually for non pregnant females at increased risk         [place] Men Who How Ofter Need  Date of Last Service Action  Prostate Cancer - DRE & PSA Men over 50 Annually.  DRE might require a copay.        Sexually Transmitted  Diseases  Syphilis All at risk adults Annually for men at increased risk        Fall Prevention in the Home, Adult Falls can cause injuries and can happen to people of all ages. There are many things you can do to make your home safe and to help prevent falls. Ask for help when making these changes. What actions can I take to prevent falls? General Instructions  Use good lighting in all rooms. Replace any light bulbs that burn out.  Turn on the lights in dark areas. Use night-lights.  Keep items that you use often in easy-to-reach places. Lower the shelves around your home if needed.  Set up your furniture so you have a clear path. Avoid moving your furniture around.  Do not have throw rugs or other things on the floor that can make you trip.  Avoid walking on wet floors.  If any of your floors are uneven, fix them.  Add color or contrast paint or tape to clearly mark and help you see: ? Grab bars or handrails. ? First and last steps of staircases. ? Where the edge of each step is.  If you use a stepladder: ? Make sure that it is fully opened. Do not climb a closed stepladder. ? Make sure the sides of the stepladder are locked in place. ? Ask someone to hold the stepladder while you use it.  Know where your pets are when moving through your home. What can I do in the bathroom?  Keep the floor dry. Clean up any water on the floor right away.  Remove soap buildup in the tub or shower.  Use nonskid mats or decals on the floor of the tub or shower.  Attach bath mats securely with double-sided, nonslip rug tape.  If you need to sit down in the shower, use a plastic, nonslip stool.  Install grab bars by the toilet and in the tub and shower. Do not use towel bars as grab bars.      What can I do in the bedroom?  Make sure that you have a light by your bed that is easy to reach.  Do not use any sheets or blankets for your bed that hang to the floor.  Have a firm  chair with side arms that you can use for support when you get dressed. What can I do in the kitchen?  Clean up any spills right  away.  If you need to reach something above you, use a step stool with a grab bar.  Keep electrical cords out of the way.  Do not use floor polish or wax that makes floors slippery. What can I do with my stairs?  Do not leave any items on the stairs.  Make sure that you have a light switch at the top and the bottom of the stairs.  Make sure that there are handrails on both sides of the stairs. Fix handrails that are broken or loose.  Install nonslip stair treads on all your stairs.  Avoid having throw rugs at the top or bottom of the stairs.  Choose a carpet that does not hide the edge of the steps on the stairs.  Check carpeting to make sure that it is firmly attached to the stairs. Fix carpet that is loose or worn. What can I do on the outside of my home?  Use bright outdoor lighting.  Fix the edges of walkways and driveways and fix any cracks.  Remove anything that might make you trip as you walk through a door, such as a raised step or threshold.  Trim any bushes or trees on paths to your home.  Check to see if handrails are loose or broken and that both sides of all steps have handrails.  Install guardrails along the edges of any raised decks and porches.  Clear paths of anything that can make you trip, such as tools or rocks.  Have leaves, snow, or ice cleared regularly.  Use sand or salt on paths during winter.  Clean up any spills in your garage right away. This includes grease or oil spills. What other actions can I take?  Wear shoes that: ? Have a low heel. Do not wear high heels. ? Have rubber bottoms. ? Feel good on your feet and fit well. ? Are closed at the toe. Do not wear open-toe sandals.  Use tools that help you move around if needed. These include: ? Canes. ? Walkers. ? Scooters. ? Crutches.  Review your  medicines with your doctor. Some medicines can make you feel dizzy. This can increase your chance of falling. Ask your doctor what else you can do to help prevent falls. Where to find more information  Centers for Disease Control and Prevention, STEADI: http://www.wolf.info/  National Institute on Aging: http://kim-miller.com/ Contact a doctor if:  You are afraid of falling at home.  You feel weak, drowsy, or dizzy at home.  You fall at home. Summary  There are many simple things that you can do to make your home safe and to help prevent falls.  Ways to make your home safe include removing things that can make you trip and installing grab bars in the bathroom.  Ask for help when making these changes in your home. This information is not intended to replace advice given to you by your health care provider. Make sure you discuss any questions you have with your health care provider. Document Revised: 08/02/2019 Document Reviewed: 08/02/2019 Elsevier Patient Education  [place] Maintenance, Male Adopting a healthy lifestyle and getting preventive care are important in promoting health and wellness. Ask your health care provider about:  The right schedule for you to have regular tests and exams.  Things you can do on your own to prevent diseases and keep yourself healthy. What should I know about diet, weight, and exercise? Eat a healthy diet  Eat a diet that  includes plenty of vegetables, fruits, low-fat dairy products, and lean protein.  Do not eat a lot of foods that are high in solid fats, added sugars, or sodium.   Maintain a healthy weight Body mass index (BMI) is a measurement that can be used to identify possible weight problems. It estimates body fat based on height and weight. Your health care provider can help determine your BMI and help you achieve or maintain a healthy weight. Get regular exercise Get regular exercise. This is one of the most important things you  can do for your health. Most adults should:  Exercise for at least 150 minutes each week. The exercise should increase your heart rate and make you sweat (moderate-intensity exercise).  Do strengthening exercises at least twice a week. This is in addition to the moderate-intensity exercise.  Spend less time sitting. Even light physical activity can be beneficial. Watch cholesterol and blood lipids Have your blood tested for lipids and cholesterol at 69 years of age, then have this test every 5 years. You may need to have your cholesterol levels checked more often if:  Your lipid or cholesterol levels are high.  You are older than 69 years of age.  You are at high risk for heart disease. What should I know about cancer screening? Many types of cancers can be detected early and may often be prevented. Depending on your health history and family history, you may need to have cancer screening at various ages. This may include screening for:  Colorectal cancer.  Prostate cancer.  Skin cancer.  Lung cancer. What should I know about heart disease, diabetes, and high blood pressure? Blood pressure and heart disease  High blood pressure causes heart disease and increases the risk of stroke. This is more likely to develop in people who have high blood pressure readings, are of African descent, or are overweight.  Talk with your health care provider about your target blood pressure readings.  Have your blood pressure checked: ? Every 3-5 years if you are 34-59 years of age. ? Every year if you are 82 years old or older.  If you are between the ages of 30 and 37 and are a current or former smoker, ask your health care provider if you should have a one-time screening for abdominal aortic aneurysm (AAA). Diabetes Have regular diabetes screenings. This checks your fasting blood sugar level. Have the screening done:  Once every three years after age 3 if you are at a normal weight and have  a low risk for diabetes.  More often and at a younger age if you are overweight or have a high risk for diabetes. What should I know about preventing infection? Hepatitis B If you have a higher risk for hepatitis B, you should be screened for this virus. Talk with your health care provider to find out if you are at risk for hepatitis B infection. Hepatitis C Blood testing is recommended for:  Everyone born from [birth year] through 1965.  Anyone with known risk factors for hepatitis C. Sexually transmitted infections (STIs)  You should be screened each year for STIs, including gonorrhea and chlamydia, if: ? You are sexually active and are younger than 69 years of age. ? You are older than 70 years of age and your health care provider tells you that you are at risk for this type of infection. ? Your sexual activity has changed since you were last screened, and you are at increased risk for chlamydia or gonorrhea.  Ask your health care provider if you are at risk.  Ask your health care provider about whether you are at high risk for HIV. Your health care provider may recommend a prescription medicine to help prevent HIV infection. If you choose to take medicine to prevent HIV, you should first get tested for HIV. You should then be tested every 3 months for as long as you are taking the medicine. Follow these instructions at home: Lifestyle  Do not use any products that contain nicotine or tobacco, such as cigarettes, e-cigarettes, and chewing tobacco. If you need help quitting, ask your health care provider.  Do not use street drugs.  Do not share needles.  Ask your health care provider for help if you need support or information about quitting drugs. Alcohol use  Do not drink alcohol if your health care provider tells you not to drink.  If you drink alcohol: ? Limit how much you have to 0-2 drinks a day. ? Be aware of how much alcohol is in your drink. In the [place] one drink equals one 12  oz bottle of beer (355 mL), one 5 oz glass of wine (148 mL), or one 1 oz glass of hard liquor (44 mL). General instructions  Schedule regular health, dental, and eye exams.  Stay current with your vaccines.  Tell your health care provider if: ? You often feel depressed. ? You have ever been abused or do not feel safe at home. Summary  Adopting a healthy lifestyle and getting preventive care are important in promoting health and wellness.  Follow your health care provider's instructions about healthy diet, exercising, and getting tested or screened for diseases.  Follow your health care provider's instructions on monitoring your cholesterol and blood pressure. This information is not intended to replace advice given to you by your health care provider. Make sure you discuss any questions you have with your health care provider. Document Revised: 12/22/2017 Document Reviewed: 12/22/2017 Elsevier Patient Education  2021 [employer].

## 2020-05-16 NOTE — Progress Notes (Signed)
This AWV is being conducted in-person at [facility]  Subjective:   Robert Park is a 70 y.o. male who presents for a Medicare Annual Wellness Visit.  The following items have been reviewed and updated today in the appropriate area in the EMR.   Health Risk Assessment  Height, weight, BMI, and BP Visual acuity if needed Depression screen Fall risk / safety level Advance directive discussion Medical and family history were reviewed and updated Updating list of other providers & suppliers Medication reconciliation, including over the counter medicines Cognitive screen Written screening schedule Risk Factor list Personalized health advice, risky behaviors, and treatment advice  Social History   Social History Narrative   Current Social History 05/16/2020        Patient lives with house mate in a two level townhome with handrails inside and 1-2 outside steps without handrails       Patient's method of transportation is personal car.      The highest level of education was 2 years college.      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 Also, disabled from [place]       Identified important relationships are house mate, Cecile Hearing       Pets : 2 rescue cats from [place]       Interests / Fun: Going to gym, day trips ([place]), different restaurants       Current Stressors: Worry about son with PTSD (15 years service in [place] and [place])       Religious / Personal Beliefs: "A bit Spiritual."       L. Benjimin Hadden, BSN, RN-BC            Objective:    Vitals: BP (!) 154/99 (BP Location: Right Arm, Patient Position: Sitting, Cuff Size: Normal)   Pulse 75   Temp 99.1 F (37.3 C) (Oral)   Ht 5' 7"  (1.702 m)   Wt 228 lb 14.4 oz (103.8 kg)   SpO2 96%   BMI 35.85 kg/m  Vitals are [facility] performed  Activities of Daily Living In your present state of health, do you have any difficulty performing the following activities: 05/16/2020 02/05/2020   Hearing? Y Y  Comment bilat hearing aids patient has hearing aids  Vision? N N  Difficulty concentrating or making decisions? N N  Walking or climbing stairs? N N  Dressing or bathing? N N  Doing errands, shopping? N N  Some recent data might be hidden    Goals Goals    .  Exercise 3x per week (40 min per time) (pt-stated)      Going to gym    .  Weight (lb) < 200 lb (90.7 kg) (pt-stated)      Goal is to lose this weight prior to trip in October 2022       Fall Risk Fall Risk  05/16/2020 05/10/2020 04/09/2020 02/05/2020 09/01/2019  Falls in the past year? 0 0 0 0 0  Number falls in past yr: - - - 0 -  Injury with Fall? - - - 0 -  Risk for fall due to : No Fall Risks - - - -  Follow up Falls prevention discussed;Education provided - Falls evaluation completed - -   CDC Handout on Fall Prevention and Handout on Home Exercise Program, Access codes [email or web address] and [email or web address] given to patient with exercise band.   Depression Screen PHQ 2/9 Scores 05/16/2020 05/10/2020 04/09/2020 02/05/2020  PHQ - 2 Score 0  0 0 0     Cognitive Testing Six-Item Cognitive Screener   "I would like to ask you some questions that ask you to use your memory. I am going to name three objects. Please wait until I say all three words, then repeat them. Remember what they are  because I am going to ask you to name them again in a few minutes. Please repeat these words for me: APPLE--TABLE--PENNY." (Interviewer may repeat names 3 times if necessary but repetition not scored.)  Did patient correctly repeat all three words? Yes - may proceed with screen  What year is this? Correct What month is this? Correct What day of the week is this? Correct  What were the three objects I asked you to remember? . Apple Correct . Table Correct . Penny Correct  Score one point for each incorrect answer.  A score of 2 or more points warrants additional investigation.  Patient's score 0   Assessment and Plan:     Patient  will call back to schedule a follow up with Dr. Evette Doffing for ~ 08/04/2020 Patient made a goal to lose weight (< 200 lbs) Patient made a goal to exercise 3 times per week for 40 minutes each time (goes to gym)   During the course of the visit the patient was educated and counseled about appropriate screening and preventive services as documented in the assessment and plan.  The printed AVS was given to the patient and included an updated screening schedule, a list of risk factors, and personalized health advice.        Velora Heckler, RN  05/16/2020

## 2020-05-17 NOTE — Progress Notes (Signed)
I discussed the AWV findings with the RN who conducted the visit. I was present in the office suite and immediately available to provide assistance and direction throughout the time the service was provided.  On prior visits patient's blood pressure has been well controlled on losartan 50 mg daily.  Blood pressure during this visit does seem like an isolated incident, would recommend patient check blood pressures at home 1-2 times a week and bring in readings during next appointment.  At that time can make medication adjustments as needed.  We will send documentation concerning this visit to patient's PCP, Dr. Evette Doffing.

## 2020-05-17 NOTE — Progress Notes (Signed)
Internal Medicine Clinic Attending  Case discussed with Dr.  Johnney Ou  at the time of the visit.  We reviewed the AWV findings.  I agree with the assessment, diagnosis, and plan of care documented in the AWV note.

## 2020-05-27 ENCOUNTER — Other Ambulatory Visit (HOSPITAL_COMMUNITY): Payer: Self-pay

## 2020-06-03 ENCOUNTER — Encounter: Payer: Self-pay | Admitting: Family

## 2020-06-14 ENCOUNTER — Other Ambulatory Visit (HOSPITAL_COMMUNITY): Payer: Self-pay

## 2020-06-14 MED FILL — Cabotegravir 600 MG/3ML & Rilpivirine 900 MG/3ML IM Susp ER: INTRAMUSCULAR | 30 days supply | Qty: 6 | Fill #1 | Status: CN

## 2020-06-24 ENCOUNTER — Telehealth: Payer: Self-pay | Admitting: Student in an Organized Health Care Education/Training Program

## 2020-06-24 NOTE — Telephone Encounter (Signed)
Pt reporting x 2 weeks a severe cough that want stop, feels like he is choking, shortness of breath, coughing up a lot of clear phlegmon, feel like he is going to pass out several times.   Pt has been taking OTC medicines not working.  Please call back.

## 2020-06-24 NOTE — Telephone Encounter (Signed)
Return pt's call. Stated he has been coughing x 6 weeks d/t pollen; hx of allergies. But the last 2 weeks he has been coughing up clear phlegm, c/o dry throat ( he has been drinking lots of water). He has tried flonase, saline nasal spray, Zyretec.No fever. C/o sob with the constant coughing ans some chills. He has been covid vaccinated and boosted. Stated he gets tested for covid twice a week at his job which has been negative. Stated he had bronchitis in the past. No available appt until tomorrow afternoon -appt schedule with Dr Wynetta Emery @ 1515PM.

## 2020-06-25 ENCOUNTER — Ambulatory Visit (INDEPENDENT_AMBULATORY_CARE_PROVIDER_SITE_OTHER): Payer: Medicare Other | Admitting: Student

## 2020-06-25 ENCOUNTER — Encounter: Payer: Self-pay | Admitting: Student

## 2020-06-25 VITALS — BP 139/92 | HR 74 | Temp 98.6°F | Wt 227.3 lb

## 2020-06-25 DIAGNOSIS — R058 Other specified cough: Secondary | ICD-10-CM | POA: Diagnosis not present

## 2020-06-25 DIAGNOSIS — H9190 Unspecified hearing loss, unspecified ear: Secondary | ICD-10-CM | POA: Diagnosis not present

## 2020-06-25 DIAGNOSIS — R059 Cough, unspecified: Secondary | ICD-10-CM

## 2020-06-25 DIAGNOSIS — J309 Allergic rhinitis, unspecified: Secondary | ICD-10-CM

## 2020-06-25 MED ORDER — BENZONATATE 100 MG PO CAPS: 100.0000 mg | ORAL_CAPSULE | Freq: Three times a day (TID) | ORAL | 0 refills | Status: DC | PRN

## 2020-06-25 MED ORDER — ALBUTEROL SULFATE HFA 108 (90 BASE) MCG/ACT IN AERS: 1.0000 | INHALATION_SPRAY | Freq: Four times a day (QID) | RESPIRATORY_TRACT | 0 refills | Status: DC | PRN

## 2020-06-25 MED ORDER — CETIRIZINE HCL 10 MG PO TABS: 10.0000 mg | ORAL_TABLET | Freq: Every day | ORAL | 2 refills | Status: DC

## 2020-06-25 NOTE — Patient Instructions (Signed)
Robert Park,  It was a pleasure meeting you today in clinic.  For your cough, it is likely due to poorly controlled allergies. The allergies cause drainage down the back of your throat and the throat irritation causes a bad cough. We strongly recommend continuing Flonase every day. You can even use two sprays of this medication in each nostril. Additionally, we recommend that you increase the dose of your cetirizine (or Zyrtec) to 14m daily. I have placed a referral to an allergist who can help further assess and address these symptoms.  For your hearing, I have placed a referral to an ENT. They may be able to help with these symptoms as well!  Sincerely, Dr. MPaulla Dolly MD

## 2020-06-25 NOTE — Assessment & Plan Note (Signed)
Patient presents for evaluation of allergies and cough. Patient reports approximately two month history of sinus congestion, sneezing, runny nose and watery eyes. He states that he has had an increasing tickling sensation in the back of his throat that causes him to develop "coughing fits" which cause him to feel short of breath. He has been using Flonase nightly, nasal irrigation most mornings, Zyrtec, chloraseptic spray and lozenges, robitussin and Ricola without significant improvement in the frequency of these symptoms. Patient has experienced these symptoms annually around the spring although this year his symptoms are noticeably worse. He states that he previously followed with a pulmonologist due to similar concerns and underwent pulmonary function testing which was not consistent with asthma or COPD. Despite this, he was prescribed an albuterol inhaler which he states previously helped his symptoms.   On examination, patient resting comfortably and breathing well on room air. Able to speak rapidly in full sentences without evidence of labored breathing. Lungs clear to auscultation with no appreciable wheezes or rales. Examination of the oropharynx demonstrates mild cobblestoning.   Overall, patient's presentation is most concerning for upper airway cough syndrome secondary to allergies. Patient would benefit from evaluation by allergist for consideration of allergy testing. Additionally, patient requesting referral to receive new hearing aids therefore we will place referral to ENT practice. -Referral to allergist -Referral to ENT -Start cetirizine 10m daily -Tessalon perles as needed for coughing fits -Albuterol refilled to use as needed, although PFTs were reportedly previously within normal limits

## 2020-06-25 NOTE — Progress Notes (Signed)
   CC: Cough  HPI:  Robert Park is a 70 y.o. with history of seasonal allergies who presents to clinic for evaluation of cough. Refer to problem list for charting of this encounter.  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 Denies fevers, chills, weight loss, night sweats.   Physical Exam:  Vitals:   06/25/20 1532  BP: (!) 139/92  Pulse: 74  Temp: 98.6 F (37 C)  TempSrc: Oral  SpO2: 97%  Weight: 227 lb 4.8 oz (103.1 kg)   Physical Exam HENT:     Right Ear: Tympanic membrane, ear canal and external ear normal. There is no impacted cerumen.     Left Ear: Tympanic membrane, ear canal and external ear normal. There is no impacted cerumen.     Nose: Congestion present. No rhinorrhea.     Mouth/Throat:     Comments: Cobblestoning of posterior oropharynx Eyes:     Extraocular Movements: Extraocular movements intact.     Conjunctiva/sclera: Conjunctivae normal.  Cardiovascular:     Rate and Rhythm: Normal rate and regular rhythm.     Pulses: Normal pulses.     Heart sounds: Normal heart sounds.  Pulmonary:     Effort: Pulmonary effort is normal. No respiratory distress.     Breath sounds: Normal breath sounds. No wheezing or rales.    Assessment & Plan:   See Encounters Tab for problem based charting.  Patient discussed with Dr. Evette Doffing

## 2020-06-25 NOTE — Assessment & Plan Note (Addendum)
(  See upper airway cough syndrome)

## 2020-06-26 ENCOUNTER — Ambulatory Visit: Payer: Medicare Other | Admitting: Dermatology

## 2020-06-26 NOTE — Progress Notes (Signed)
Internal Medicine Clinic Attending  Case discussed with Dr. Johnson  At the time of the visit.  We reviewed the resident's history and exam and pertinent patient test results.  I agree with the assessment, diagnosis, and plan of care documented in the resident's note.  

## 2020-07-01 ENCOUNTER — Other Ambulatory Visit (HOSPITAL_COMMUNITY): Payer: Self-pay

## 2020-07-01 MED FILL — Cabotegravir 600 MG/3ML & Rilpivirine 900 MG/3ML IM Susp ER: INTRAMUSCULAR | 30 days supply | Qty: 6 | Fill #1 | Status: AC

## 2020-07-02 ENCOUNTER — Telehealth: Payer: Self-pay

## 2020-07-02 ENCOUNTER — Encounter: Payer: Self-pay | Admitting: Allergy & Immunology

## 2020-07-02 ENCOUNTER — Ambulatory Visit: Payer: Medicare Other | Admitting: Allergy & Immunology

## 2020-07-02 ENCOUNTER — Other Ambulatory Visit: Payer: Self-pay

## 2020-07-02 VITALS — BP 118/82 | HR 93 | Temp 96.5°F | Resp 16 | Ht 67.0 in | Wt 225.8 lb

## 2020-07-02 DIAGNOSIS — J3089 Other allergic rhinitis: Secondary | ICD-10-CM

## 2020-07-02 DIAGNOSIS — G4733 Obstructive sleep apnea (adult) (pediatric): Secondary | ICD-10-CM

## 2020-07-02 DIAGNOSIS — J4530 Mild persistent asthma, uncomplicated: Secondary | ICD-10-CM

## 2020-07-02 DIAGNOSIS — J302 Other seasonal allergic rhinitis: Secondary | ICD-10-CM

## 2020-07-02 MED ORDER — OLOPATADINE HCL 0.6 % NA SOLN
2.0000 | Freq: Every day | NASAL | 5 refills | Status: DC
Start: 2020-07-02 — End: 2020-10-21

## 2020-07-02 MED ORDER — FLUTICASONE PROPIONATE HFA 110 MCG/ACT IN AERO: 2.0000 | INHALATION_SPRAY | Freq: Two times a day (BID) | RESPIRATORY_TRACT | 5 refills | Status: DC

## 2020-07-02 MED ORDER — MONTELUKAST SODIUM 10 MG PO TABS: 10.0000 mg | ORAL_TABLET | Freq: Every day | ORAL | 5 refills | Status: DC

## 2020-07-02 NOTE — Progress Notes (Signed)
NEW PATIENT  Date of Service/Encounter:  07/02/20  Consult requested by: Axel Filler, MD   Assessment:   Mild persistent asthma, uncomplicated   Seasonal and perennial allergic rhinitis (grasses, ragweed, weeds, trees, cat, and dog)  Complicated past medical history including well-controlled HIV as well as ulcerative colitis   Mr. [name] presents for an evaluation of a cough and hoarseness in the setting of allergic rhinitis symptoms. They seem to be repetitive around the same time of the year and are often treated as bronchitis. Testing today is revealing predominantly for reactivity to pollen with some smaller activities to some indoor allergens. We are going to start a Flovent to pass twice daily to see if this will help decrease his  mucous production and decrease the need for albuterol and hopefully help the hoarseness. I think we can eventually just use the seasonally, but I want him to stay on it until his next visit just to see if there's any improvement. We could also consider adding on a reflux medication at the next visit as well. He is going to update [place] in a couple of weeks.    Plan/Recommendations:   1. Mild persistent asthma, uncomplicated - Lung testing looked normal, but your forced vital capacity (an estimate of your lung volume) improved following the inhaler treatment. - Because of this, we are going to start a daily medication to help improve your breathing. - Given your allergy testing, it seems that this might be just a seasonal medication to use during the pollen season. - However, we can discuss changing the dosing at the next visit. - Spacer use reviewed. - Daily controller medication(s): Flovent 126mg 2 puffs twice daily with spacer - Prior to physical activity: albuterol 2 puffs 10-15 minutes before physical activity. - Rescue medications: albuterol 4 puffs every 4-6 hours as needed - Changes during respiratory infections or worsening symptoms:  Increase Flovent 1176m to 4 puffs twice daily for TWO WEEKS. - Asthma control goals:  * Full participation in all desired activities (may need albuterol before activity) * Albuterol use two time or less a week on average (not counting use with activity) * Cough interfering with sleep two time or less a month * Oral steroids no more than once a year * No hospitalizations  2. Chronic rhinitis - Testing today showed: grasses, ragweed, weeds, trees, cat, and dog (tree and ragweed were by far the largest - Copy of test results provided.  - Avoidance measures provided. - Continue with: Zyrtec (cetirizine) 1078mablet 1-2 times daily and Flonase (fluticasone) two sprays per nostril daily (NOTE NEW ZYRTEC DOSING) - Start taking: Singulair (montelukast) 64m64mily and Patanase (olopatadine) two sprays per nostril 1-2 times daily as needed - Singulair can cause irritability and depression, so be aware of that and stop it if it happens, however, most people tolerate it without a problem - You can use an extra dose of the antihistamine, if needed, for breakthrough symptoms.  - Consider nasal saline rinses 1-2 times daily to remove allergens from the nasal cavities as well as help with mucous clearance (this is especially helpful to do before the nasal sprays are given) - Consider allergy shots as a means of long-term control. - Allergy shots "re-train" and "reset" the immune system to ignore environmental allergens and decrease the resulting immune response to those allergens (sneezing, itchy watery eyes, runny nose, nasal congestion, etc).    - Allergy shots improve symptoms in 75-85% of patients.  - We  can discuss more at the next appointment if the medications are not working for you.  3. Follow up in 6 weeks or earlier if needed.    This note in its entirety was forwarded to the Provider who requested this consultation.  Subjective:   Robert Park is a 70 y.o. male presenting today for  evaluation of  Chief Complaint  Patient presents with   Allergic Rhinitis     States for the past 2 months he has been suffering. He went on a cruise 3 weeks ago and his symptoms have gotten worse.  Cough, throat tightness, tickle in throat, choking in the middle of the night, excessive thirst, Shortness of breath. Clear discharge from the mouth.   Asthma    Shortness of breath, tight chest, feeling like he is going to pass out.     Robert Park has a history of the following: Patient Active Problem List   Diagnosis Date Noted   Mild persistent asthma, uncomplicated 49/44/9675   Seasonal and perennial allergic rhinitis 07/02/2020   Upper airway cough syndrome 06/25/2020   Allergic sinusitis 02/05/2020   Hypertension 08/07/2019   Hyperglycemia 08/07/2019   Hyperlipidemia 08/07/2019   GAD (generalized anxiety disorder) 08/07/2019   Localized osteoarthritis of left hand 08/07/2019   Colon polyps 06/06/2019   Human immunodeficiency virus (HIV) disease ([place]) 02/16/2019   Screening for STDs (sexually transmitted diseases) 02/16/2019   Osteopenia 02/16/2019   Healthcare maintenance 02/16/2019   Ulcerative colitis without complications ([place]) 91/63/8466    History obtained from: chart review and patient.  Adis Sturgill was referred by Axel Filler, MD.     Robert Park is a 70 y.o. male presenting for an evaluation of chest tightness and choking in the middle of the night . He moved here around 1.5 years ago. [place]    Asthma/Respiratory Symptom History: He has had a cough for a years. This was triggered initially by a red tide exposure. He did fine last year but this year he has had issues and it has lingered. This is especially notable in the spring season. He never had a diagnosis of asthma ubt he has had albuterol in the past. He has bronchitis every other year or so.   Allergic Rhinitis Symptom History:  He has taken over the counter  cetirizine in the past. He has Ricola drops. He has used Chiropractor. The most annoying this now is the choking with mucous in the middle of the night.   Eczema Symptom History: He has atopic dermatitis intermittently. He has been a topical ointment in the past. He has had shingles on a couple of occasions.   He has HIV and is on Cabenuva every two months. He has ulcerative colitis and used Humira every 2 weeks. He has been on this for 3 years and has not been in the hospital at all since then.   He has a history of OSA and has a CPAP. He has never seen anyone here for management of the CPAP. He has a pending appointment with Dermatology for a skin cancer evaluation.   Otherwise, there is no history of other atopic diseases, including food allergies, drug allergies, stinging insect allergies, eczema, urticaria, or contact dermatitis. There is no significant infectious history. Vaccinations are up to date.    Past Medical History: Patient Active Problem List   Diagnosis Date Noted   Mild persistent asthma, uncomplicated 59/93/5701   Seasonal and perennial allergic  rhinitis 07/02/2020   Upper airway cough syndrome 06/25/2020   Allergic sinusitis 02/05/2020   Hypertension 08/07/2019   Hyperglycemia 08/07/2019   Hyperlipidemia 08/07/2019   GAD (generalized anxiety disorder) 08/07/2019   Localized osteoarthritis of left hand 08/07/2019   Colon polyps 06/06/2019   Human immunodeficiency virus (HIV) disease ([place]) 02/16/2019   Screening for STDs (sexually transmitted diseases) 02/16/2019   Osteopenia 02/16/2019   Healthcare maintenance 02/16/2019   Ulcerative colitis without complications ([place]) 59/56/3875    Medication List:  Allergies as of 07/02/2020       Reactions   Elemental Sulfur    Prednisone    Statins    Muscle pains, GI symptoms   Sulfamethoxazole-trimethoprim         Medication List        Accurate as of July 02, 2020 12:28 PM. If you have any  questions, ask your nurse or doctor.          albuterol 108 (90 Base) MCG/ACT inhaler Commonly known as: VENTOLIN HFA Inhale 1 puff into the lungs every 6 (six) hours as needed for wheezing or shortness of breath.   aspirin EC 81 MG tablet Take 1 tablet (81 mg total) by mouth daily. Swallow whole.   benzonatate 100 MG capsule Commonly known as: Tessalon Perles Take 1 capsule (100 mg total) by mouth 3 (three) times daily as needed for cough.   Cabenuva 600 & 900 MG/3ML injection Generic drug: cabotegravir & rilpivirine ER INJECT 1 KIT INTO THE MUSCLE ONCE FOR 1 DOSE.   Centrum Silver 50+Men Tabs Take by mouth. Three times per week   cetirizine 10 MG tablet Commonly known as: ZyrTEC Allergy Take 1 tablet (10 mg total) by mouth daily.   escitalopram 20 MG tablet Commonly known as: LEXAPRO TAKE 1 TABLET(20 MG) BY MOUTH DAILY   ezetimibe 10 MG tablet Commonly known as: ZETIA TAKE 1 TABLET(10 MG) BY MOUTH DAILY   fluticasone 110 MCG/ACT inhaler Commonly known as: Flovent HFA Inhale 2 puffs into the lungs 2 (two) times daily. Started by: Valentina Shaggy, MD   fluticasone 50 MCG/ACT nasal spray Commonly known as: Flonase Place 1 spray into both nostrils daily.   Humira 40 MG/0.4ML Pskt Generic drug: Adalimumab Inject into the skin.   ibuprofen 200 MG tablet Commonly known as: ADVIL Take 200 mg by mouth every 6 (six) hours as needed.   losartan 50 MG tablet Commonly known as: COZAAR TAKE 1 TABLET(50 MG) BY MOUTH DAILY   montelukast 10 MG tablet Commonly known as: SINGULAIR Take 1 tablet (10 mg total) by mouth at bedtime. Started by: Valentina Shaggy, MD   Olopatadine HCl 0.6 % Soln Place 2 sprays into the nose daily. Started by: Valentina Shaggy, MD   tadalafil 10 MG tablet Commonly known as: Cialis Take 0.5 tablets (5 mg total) by mouth daily as needed for erectile dysfunction.   Vitamin D (Ergocalciferol) 1.25 MG (50000 UNIT) Caps  capsule Commonly known as: DRISDOL Take 1 capsule (50,000 Units total) by mouth every 7 (seven) days.        Birth History: non-contributory  Developmental History: non-contributory  Past Surgical History: Past Surgical History:  Procedure Laterality Date   CARPAL TUNNEL RELEASE Right    x 2   CARPAL TUNNEL RELEASE Left    x 2   CATARACT EXTRACTION, BILATERAL Bilateral 10/2017   COLONOSCOPY     HEMORRHOID SURGERY     MOUTH SURGERY     TONSILLECTOMY AND ADENOIDECTOMY  age 5   UPPER GASTROINTESTINAL ENDOSCOPY     VASECTOMY       Family History: Family History  Problem Relation Age of Onset   Colon cancer Mother 39   Heart failure Mother    Cervical cancer Mother    Leukemia Mother    Diabetes Mother    Lung cancer Father    Diabetes Sister    Diverticulitis Brother    Diabetes Maternal Grandmother    Diabetes Brother    Hypertension Brother    Heart disease Brother    Hypertension Sister    Kidney disease Sister    Post-traumatic stress disorder Son    Hypertension Son    Hyperlipidemia Son    Anxiety disorder Son    Esophageal cancer Neg Hx    Rectal cancer Neg Hx    Stomach cancer Neg Hx      Social History: Robert Park lives at home with a roommate.  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  He did have some exposures to ammonia and other chemicals, but he tried to limit his exposures as the driver only   Review of Systems  Constitutional: Negative.  Negative for chills, fever, malaise/fatigue and weight loss.  HENT:  Positive for congestion. Negative for ear discharge and ear pain.        Positive for hoarseness.  Positive for postnasal drip.  Eyes:  Negative for pain, discharge and redness.  Respiratory:  Negative for cough, sputum production, shortness of breath and wheezing.   Cardiovascular: Negative.  Negative for chest pain and  palpitations.  Gastrointestinal:  Negative for abdominal pain, constipation, diarrhea, heartburn, nausea and vomiting.  Skin: Negative.  Negative for itching and rash.  Neurological:  Negative for dizziness and headaches.  Endo/Heme/Allergies:  Negative for environmental allergies. Does not bruise/bleed easily.      Objective:   Blood pressure 118/82, pulse 93, temperature (!) 96.5 F (35.8 C), temperature source Temporal, resp. rate 16, height 5' 7"  (1.702 m), weight 225 lb 12.8 oz (102.4 kg), SpO2 97 %. Body mass index is 35.37 kg/m.   Physical Exam:   Physical Exam Constitutional:      Appearance: He is well-developed.  HENT:     Head: Normocephalic and atraumatic.     Right Ear: Tympanic membrane, ear canal and external ear normal. No drainage, swelling or tenderness. Tympanic membrane is not injected, scarred, erythematous, retracted or bulging.     Left Ear: Tympanic membrane, ear canal and external ear normal. No drainage, swelling or tenderness. Tympanic membrane is not injected, scarred, erythematous, retracted or bulging.     Nose: No nasal deformity, septal deviation, mucosal edema or rhinorrhea.     Right Turbinates: Enlarged and swollen.     Left Turbinates: Enlarged and swollen.     Right Sinus: No maxillary sinus tenderness or frontal sinus tenderness.     Left Sinus: No maxillary sinus tenderness or frontal sinus tenderness.     Comments: No nasal polyps.    Mouth/Throat:     Mouth: Mucous membranes are not pale and not dry.     Pharynx: Uvula midline.  Eyes:     General: Lids are normal. Allergic shiner present.        Right eye: No discharge.        Left eye: No discharge.     Conjunctiva/sclera: Conjunctivae normal.  Right eye: Right conjunctiva is not injected. No chemosis.    Left eye: Left conjunctiva is not injected. No chemosis.    Pupils: Pupils are equal, round, and reactive to light.  Cardiovascular:     Rate and Rhythm: Normal rate and  regular rhythm.     Heart sounds: Normal heart sounds.  Pulmonary:     Effort: Pulmonary effort is normal. No tachypnea, accessory muscle usage or respiratory distress.     Breath sounds: Normal breath sounds. No wheezing, rhonchi or rales.     Comments: Moving air well in all lung fields.  No increased work of breathing. Chest:     Chest wall: No tenderness.  Abdominal:     Tenderness: There is no abdominal tenderness. There is no guarding or rebound.  Lymphadenopathy:     Head:     Right side of head: No submandibular, tonsillar or occipital adenopathy.     Left side of head: No submandibular, tonsillar or occipital adenopathy.     Cervical: No cervical adenopathy.  Skin:    General: Skin is warm.     Capillary Refill: Capillary refill takes less than 2 seconds.     Coloration: Skin is not pale.     Findings: No abrasion, erythema, petechiae or rash. Rash is not papular, urticarial or vesicular.     Comments: No eczematous or urticarial lesions noted.  Neurological:     Mental Status: He is alert.  Psychiatric:        Behavior: Behavior is cooperative.     Diagnostic studies:    Spirometry: results normal (FEV1: 2.40/81%, FVC: 3.53/91%, FEV1/FVC: 68%).    Spirometry consistent with normal pattern. Xopenex four puffs via MDI treatment given in clinic with significant improvement in FVC per ATS criteria.  Allergy Studies:     Airborne Adult Perc - 07/02/20 0930     Time Antigen Placed 0930    Allergen Manufacturer Lavella Hammock    Location Back    Number of Test 59    1. Control-Buffer 50% Glycerol Negative    2. Control-Histamine 1 mg/ml 2+    3. Albumin saline Negative    4. Sutherlin Negative    5. [place] Negative    6. Johnson Negative    7. Willard Blue Negative    8. Meadow Fescue Negative    9. Perennial Rye Negative    10. Sweet Vernal Negative    11. Timothy Negative    12. Cocklebur Negative    13. Burweed Marshelder Negative    14. Ragweed, short Negative     15. Ragweed, Giant Negative    16. Plantain,  English Negative    17. Lamb's Quarters Negative    18. Sheep Sorrell Negative    19. Rough Pigweed Negative    20. Marsh Elder, Rough Negative    21. Mugwort, Common Negative    22. Ash mix Negative    23. Birch mix Negative    24. Beech American Negative    25. Box, Elder Negative    26. Cedar, red Negative    27. Cottonwood, [place] Negative    28. Elm mix Negative    29. Hickory Negative    30. Maple mix Negative    31. Oak, [place] mix Negative    32. Pecan Pollen Negative    33. Pine mix Negative    34. Sycamore Eastern Negative    35. Prado Verde, Black Pollen Negative    36. Alternaria alternata Negative  37. Cladosporium Herbarum Negative    38. Aspergillus mix Negative    39. Penicillium mix Negative    40. Bipolaris sorokiniana (Helminthosporium) Negative    41. Drechslera spicifera (Curvularia) Negative    42. Mucor plumbeus Negative    43. Fusarium moniliforme Negative    44. Aureobasidium pullulans (pullulara) Negative    45. Rhizopus oryzae Negative    46. Botrytis cinera Negative    47. Epicoccum nigrum Negative    48. Phoma betae Negative    49. Candida Albicans Negative    50. Trichophyton mentagrophytes Negative    51. Mite, D Farinae  5,000 AU/ml Negative    52. Mite, D Pteronyssinus  5,000 AU/ml Negative    53. Cat Hair 10,000 BAU/ml Negative    54.  Dog Epithelia Negative    55. Mixed Feathers Negative    56. Horse Epithelia Negative    57. Cockroach, German Negative    58. Mouse Negative    59. Tobacco Leaf Negative             Intradermal - 07/02/20 1013     Time Antigen Placed 1013    Allergen Manufacturer Greer    Location Arm    Number of Test 15    Intradermal Select    Control Negative    Guatemala Negative    Johnson 1+    7 Grass Negative    Ragweed mix 4+    Weed mix 2+    Tree mix 4+    Mold 1 Negative    Mold 2 Negative    Mold 3 Negative    Mold 4 Negative    Cat 1+    Dog  1+    Cockroach Negative    Mite mix Negative             Allergy testing results were read and interpreted by myself, documented by clinical staff.         Salvatore Marvel, MD [facility]

## 2020-07-02 NOTE — Telephone Encounter (Signed)
[facility] Patient Advocate Encounter  Patient's medication (Cabenuva)have been couriered to [facility] from [place] and will be administered on patient next office visit on 07/10/20.  Robert Park , [facility] Patient [facility] Phone: [phone] Fax:  [phone]

## 2020-07-02 NOTE — Patient Instructions (Addendum)
1. Mild persistent asthma, uncomplicated - Lung testing looked normal, but your forced vital capacity (an estimate of your lung volume) improved following the inhaler treatment. - Because of this, we are going to start a daily medication to help improve your breathing. - Given your allergy testing, it seems that this might be just a seasonal medication to use during the pollen season. - However, we can discuss changing the dosing at the next visit. - Spacer use reviewed. - Daily controller medication(s): Flovent 176mg 2 puffs twice daily with spacer - Prior to physical activity: albuterol 2 puffs 10-15 minutes before physical activity. - Rescue medications: albuterol 4 puffs every 4-6 hours as needed - Changes during respiratory infections or worsening symptoms: Increase Flovent 1156m to 4 puffs twice daily for TWO WEEKS. - Asthma control goals:  * Full participation in all desired activities (may need albuterol before activity) * Albuterol use two time or less a week on average (not counting use with activity) * Cough interfering with sleep two time or less a month * Oral steroids no more than once a year * No hospitalizations  2. Chronic rhinitis - Testing today showed: grasses, ragweed, weeds, trees, cat, and dog (tree and ragweed were by far the largest - Copy of test results provided.  - Avoidance measures provided. - Continue with: Zyrtec (cetirizine) 1030mablet 1-2 times daily and Flonase (fluticasone) two sprays per nostril daily (NOTE NEW ZYRTEC DOSING) - Start taking: Singulair (montelukast) 19m76mily and Patanase (olopatadine) two sprays per nostril 1-2 times daily as needed - Singulair can cause irritability and depression, so be aware of that and stop it if it happens, however, most people tolerate it without a problem - You can use an extra dose of the antihistamine, if needed, for breakthrough symptoms.  - Consider nasal saline rinses 1-2 times daily to remove allergens  from the nasal cavities as well as help with mucous clearance (this is especially helpful to do before the nasal sprays are given) - Consider allergy shots as a means of long-term control. - Allergy shots "re-train" and "reset" the immune system to ignore environmental allergens and decrease the resulting immune response to those allergens (sneezing, itchy watery eyes, runny nose, nasal congestion, etc).    - Allergy shots improve symptoms in 75-85% of patients.  - We can discuss more at the next appointment if the medications are not working for you.  3. Follow up in 6 weeks or earlier if needed.    Please inform us [facility]any Emergency Department visits, hospitalizations, or changes in symptoms. Call us bKoreaore going to the ED for breathing or allergy symptoms since we might be able to fit you in for a sick visit. Feel free to contact us aKoreatime with any questions, problems, or concerns.  It was a pleasure to meet you today!  Websites that have reliable patient information: 1. American Academy of Asthma, Allergy, and Immunology: www.aaaai.org 2. Food Allergy Research and Education (FARE): foodallergy.org 3. Mothers of Asthmatics: http://www.asthmacommunitynetwork.org 4. American College of Allergy, Asthma, and Immunology: www.acaai.org   COVID-19 Vaccine Information can be found at: [email or web address] questions related to vaccine distribution or appointments, please email [email or web address] or call [phone] realize that you might be concerned about having an allergic reaction to the COVID19 vaccines. To help with that concern, WE ARE OFFERING THE COVID19 VACCINES IN OUR OFFICE! Ask the front desk for dates!     "Like" us o[place]Facebook and Instagram for our latest  updates!      A healthy democracy works best when [employer] participate! Make sure you are registered to vote! If you have moved or changed any of your  contact information, you will need to get this updated before voting!  In some cases, you MAY be able to register to vote online: [email or web address]    Location Arm   Number of Test 15   Intradermal Select   Control Negative   [place] Negative   [place] 1+   7 Grass Negative   Ragweed mix 4+   Weed mix 2+   Tree mix 4+   Mold 1 Negative   Mold 2 Negative   Mold 3 Negative   Mold 4 Negative   Cat 1+   Dog 1+   Cockroach Negative   Mite mix Negative     Reducing Pollen Exposure  The American Academy of Allergy, Asthma and Immunology suggests the following steps to reduce your exposure to pollen during allergy seasons.    Do not hang sheets or clothing out to dry; pollen may collect on these items. Do not mow lawns or spend time around freshly cut grass; mowing stirs up pollen. Keep windows closed at night.  Keep car windows closed while driving. Minimize morning activities outdoors, a time when pollen counts are usually at their highest. Stay indoors as much as possible when pollen counts or humidity is high and on windy days when pollen tends to remain in the air longer. Use air conditioning when possible.  Many air conditioners have filters that trap the pollen spores. Use a HEPA room air filter to remove pollen form the indoor air you breathe.  Control of Dog or Cat Allergen  Avoidance is the best way to manage a dog or cat allergy. If you have a dog or cat and are allergic to dog or cats, consider removing the dog or cat from the home. If you have a dog or cat but don't want to find it a new home, or if your family wants a pet even though someone in the household is allergic, here are some strategies that may help keep symptoms at bay:  Keep the pet out of your bedroom and restrict it to only a few rooms. Be advised that keeping the dog or cat in only one room will not limit the allergens to that room. Don't pet, hug or kiss the dog or cat;  if you do, wash your hands with soap and water. High-efficiency particulate air (HEPA) cleaners run continuously in a bedroom or living room can reduce allergen levels over time. Regular use of a high-efficiency vacuum cleaner or a central vacuum can reduce allergen levels. Giving your dog or cat a bath at least once a week can reduce airborne allergen.  Allergy Shots   Allergies are the result of a chain reaction that starts in the immune system. Your immune system controls how your body defends itself. For instance, if you have an allergy to pollen, your immune system identifies pollen as an invader or allergen. Your immune system overreacts by producing antibodies called Immunoglobulin E (IgE). These antibodies travel to cells that release chemicals, causing an allergic reaction.  The concept behind allergy immunotherapy, whether it is received in the form of shots or tablets, is that the immune system can be desensitized to specific allergens that trigger allergy symptoms. Although it requires time and patience, the payback can be long-term relief.  How Do Allergy Shots Work?  Allergy shots  work much like a vaccine. Your body responds to injected amounts of a particular allergen given in increasing doses, eventually developing a resistance and tolerance to it. Allergy shots can lead to decreased, minimal or no allergy symptoms.  There generally are two phases: build-up and maintenance. Build-up often ranges from three to six months and involves receiving injections with increasing amounts of the allergens. The shots are typically given once or twice a week, though more rapid build-up schedules are sometimes used.  The maintenance phase begins when the most effective dose is reached. This dose is different for each person, depending on how allergic you are and your response to the build-up injections. Once the maintenance dose is reached, there are longer periods between injections, typically two  to four weeks.  Occasionally doctors give cortisone-type shots that can temporarily reduce allergy symptoms. These types of shots are different and should not be confused with allergy immunotherapy shots.  Who Can Be Treated with Allergy Shots?  Allergy shots may be a good treatment approach for people with allergic rhinitis (hay fever), allergic asthma, conjunctivitis (eye allergy) or stinging insect allergy.   Before deciding to begin allergy shots, you should consider:   The length of allergy season and the severity of your symptoms  Whether medications and/or changes to your environment can control your symptoms  Your desire to avoid long-term medication use  Time: allergy immunotherapy requires a major time commitment  Cost: may vary depending on your insurance coverage  Allergy shots for children age 83 and older are effective and often well tolerated. They might prevent the onset of new allergen sensitivities or the progression to asthma.  Allergy shots are not started on patients who are pregnant but can be continued on patients who become pregnant while receiving them. In some patients with other medical conditions or who take certain common medications, allergy shots may be of risk. It is important to mention other medications you talk to your allergist.   When Will I Feel Better?  Some may experience decreased allergy symptoms during the build-up phase. For others, it may take as long as 12 months on the maintenance dose. If there is no improvement after a year of maintenance, your allergist will discuss other treatment options with you.  If you aren't responding to allergy shots, it may be because there is not enough dose of the allergen in your vaccine or there are missing allergens that were not identified during your allergy testing. Other reasons could be that there are high levels of the allergen in your environment or major exposure to non-allergic triggers like tobacco  smoke.  What Is the Length of Treatment?  Once the maintenance dose is reached, allergy shots are generally continued for three to five years. The decision to stop should be discussed with your allergist at that time. Some people may experience a permanent reduction of allergy symptoms. Others may relapse and a longer course of allergy shots can be considered.  What Are the Possible Reactions?  The two types of adverse reactions that can occur with allergy shots are local and systemic. Common local reactions include very mild redness and swelling at the injection site, which can happen immediately or several hours after. A systemic reaction, which is less common, affects the entire body or a particular body system. They are usually mild and typically respond quickly to medications. Signs include increased allergy symptoms such as sneezing, a stuffy nose or hives.  Rarely, a serious systemic reaction called  anaphylaxis can develop. Symptoms include swelling in the throat, wheezing, a feeling of tightness in the chest, nausea or dizziness. Most serious systemic reactions develop within 30 minutes of allergy shots. This is why it is strongly recommended you wait in your doctor's office for 30 minutes after your injections. Your allergist is trained to watch for reactions, and his or her staff is trained and equipped with the proper medications to identify and treat them.  Who Should Administer Allergy Shots?  The preferred location for receiving shots is your prescribing allergist's office. Injections can sometimes be given at another facility where the physician and staff are trained to recognize and treat reactions, and have received instructions by your prescribing allergist.

## 2020-07-04 ENCOUNTER — Other Ambulatory Visit (HOSPITAL_COMMUNITY): Payer: Self-pay

## 2020-07-10 ENCOUNTER — Other Ambulatory Visit: Payer: Self-pay | Admitting: Family

## 2020-07-10 ENCOUNTER — Encounter: Payer: Medicare Other | Admitting: Infectious Diseases

## 2020-07-10 DIAGNOSIS — B20 Human immunodeficiency virus [HIV] disease: Secondary | ICD-10-CM

## 2020-07-16 ENCOUNTER — Ambulatory Visit (INDEPENDENT_AMBULATORY_CARE_PROVIDER_SITE_OTHER): Payer: Medicare Other | Admitting: Infectious Diseases

## 2020-07-16 ENCOUNTER — Encounter: Payer: Self-pay | Admitting: *Deleted

## 2020-07-16 ENCOUNTER — Encounter: Payer: Self-pay | Admitting: Infectious Diseases

## 2020-07-16 ENCOUNTER — Other Ambulatory Visit: Payer: Self-pay

## 2020-07-16 VITALS — BP 132/88 | HR 66 | Temp 98.2°F | Ht 67.0 in | Wt 229.0 lb

## 2020-07-16 DIAGNOSIS — B20 Human immunodeficiency virus [HIV] disease: Secondary | ICD-10-CM

## 2020-07-16 DIAGNOSIS — Z8619 Personal history of other infectious and parasitic diseases: Secondary | ICD-10-CM | POA: Insufficient documentation

## 2020-07-16 MED ORDER — CABOTEGRAVIR & RILPIVIRINE ER 600 & 900 MG/3ML IM SUER
1.0000 | Freq: Once | INTRAMUSCULAR | Status: AC
  Administered 2020-07-16: 14:00:00 1 via INTRAMUSCULAR

## 2020-07-16 NOTE — Progress Notes (Signed)
Subjective:    Patient ID: Robert Park, male    DOB: 1950-04-29, 70 y.o.   MRN: [id number]  CC:  Cabenuva injection - maintenance injection    HPI:  Robert Park is a 70 y.o. male with well controlled HIV disease here for Q50minjection. Only very minor injection site reactions for 1-2 days.   Requesting to also check syphilis labs today to make sure all is well still. No new partners or concerns for new symptoms today.    Allergies  Allergen Reactions   Elemental Sulfur    Prednisone    Statins     Muscle pains, GI symptoms   Sulfamethoxazole-Trimethoprim       Outpatient Medications Prior to Visit  Medication Sig Dispense Refill   Adalimumab (HUMIRA) 40 MG/0.4ML PSKT Inject into the skin.     albuterol (VENTOLIN HFA) 108 (90 Base) MCG/ACT inhaler Inhale 1 puff into the lungs every 6 (six) hours as needed for wheezing or shortness of breath. 8 g 0   aspirin EC 81 MG tablet Take 1 tablet (81 mg total) by mouth daily. Swallow whole. 150 tablet 2   benzonatate (TESSALON PERLES) 100 MG capsule Take 1 capsule (100 mg total) by mouth 3 (three) times daily as needed for cough. 30 capsule 0   cabotegravir & rilpivirine ER (CABENUVA) 600 & 900 MG/3ML injection INJECT 1 KIT INTO THE MUSCLE ONCE FOR 1 DOSE. 6 mL 6   cetirizine (ZYRTEC ALLERGY) 10 MG tablet Take 1 tablet (10 mg total) by mouth daily. 30 tablet 2   escitalopram (LEXAPRO) 20 MG tablet TAKE 1 TABLET(20 MG) BY MOUTH DAILY 90 tablet 0   ezetimibe (ZETIA) 10 MG tablet TAKE 1 TABLET(10 MG) BY MOUTH DAILY 90 tablet 0   fluticasone (FLONASE) 50 MCG/ACT nasal spray Place 1 spray into both nostrils daily. 16 g 2   fluticasone (FLOVENT HFA) 110 MCG/ACT inhaler Inhale 2 puffs into the lungs 2 (two) times daily. 1 each 5   ibuprofen (ADVIL) 200 MG tablet Take 200 mg by mouth every 6 (six) hours as needed.     losartan (COZAAR) 50 MG tablet TAKE 1 TABLET(50 MG) BY MOUTH DAILY 90 tablet 0   Multiple Vitamins-Minerals (CENTRUM SILVER  50+MEN) TABS Take by mouth. Three times per week     Olopatadine HCl 0.6 % SOLN Place 2 sprays into the nose daily. 30.5 g 5   tadalafil (CIALIS) 10 MG tablet Take 0.5 tablets (5 mg total) by mouth daily as needed for erectile dysfunction. 20 tablet 0   Vitamin D, Ergocalciferol, (DRISDOL) 1.25 MG (50000 UNIT) CAPS capsule Take 1 capsule (50,000 Units total) by mouth every 7 (seven) days. 12 capsule 2   montelukast (SINGULAIR) 10 MG tablet Take 1 tablet (10 mg total) by mouth at bedtime. (Patient not taking: Reported on 07/16/2020) 30 tablet 5   No facility-administered medications prior to visit.     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[place]    Depression    Diverticulosis    Dyslipidemia    ED (erectile dysfunction)    GERD (gastroesophageal reflux disease)    History of colon polyps    [place]    Hyperlipidemia    Hypertension    Internal hemorrhoids    Osteopenia    Shingles    Sleep apnea    does  not wear a c-pap   Ulcerative colitis ([place])    Vitamin D deficiency       Past Surgical History:  Procedure Laterality Date   CARPAL TUNNEL RELEASE Right    x 2   CARPAL TUNNEL RELEASE Left    x 2   CATARACT EXTRACTION, BILATERAL Bilateral 10/2017   COLONOSCOPY     HEMORRHOID SURGERY     MOUTH SURGERY     TONSILLECTOMY AND ADENOIDECTOMY     age 4   UPPER GASTROINTESTINAL ENDOSCOPY     VASECTOMY        Review of Systems      Objective:    BP 132/88   Pulse 66   Temp 98.2 F (36.8 C) (Oral)   Ht 5' 7"  (1.702 m)   Wt 229 lb (103.9 kg)   BMI 35.87 kg/m  Nursing note and vital signs reviewed.  Physical Exam      Assessment & Plan:   Patient Active Problem List   Diagnosis Date Noted   History of syphilis 07/16/2020   Mild persistent asthma, uncomplicated 45/85/9292   Seasonal and perennial allergic rhinitis 07/02/2020   Upper airway cough syndrome  06/25/2020   Allergic sinusitis 02/05/2020   Hypertension 08/07/2019   Hyperglycemia 08/07/2019   Hyperlipidemia 08/07/2019   GAD (generalized anxiety disorder) 08/07/2019   Localized osteoarthritis of left hand 08/07/2019   Colon polyps 06/06/2019   Human immunodeficiency virus (HIV) disease ([place]) 02/16/2019   Screening for STDs (sexually transmitted diseases) 02/16/2019   Osteopenia 02/16/2019   Healthcare maintenance 02/16/2019   Ulcerative colitis without complications ([place]) 44/62/8638    Problem List Items Addressed This Visit       Unprioritized   Human immunodeficiency virus (HIV) disease ([place]) - Primary (Chronic)    Doing well on every 2 month injections of Cabenuva. Tolerating well with only minor injection site reactions. All injections have been administered at appropriate treatment interval.   Will continue with Q66mviral load monitoring during first 6 months for therapeutic treatment monitoring.   Last VL result:  HIV 1 RNA Quant (Copies/mL)  Date Value  05/10/2020 Not Detected   Dose Interval: [id number] Appointment: 09/17/2020          Relevant Orders   HIV-1 RNA quant-no reflex-bld   RPR   Hepatic function panel   History of syphilis    Will repeat RPR today. No symptoms to warrant empiric treatment or concern for exposure.           I am having Robert Park" maintain his Humira, Centrum Silver 50+Men, aspirin EC, fluticasone, tadalafil, ibuprofen, Vitamin D (Ergocalciferol), cabotegravir & rilpivirine ER, losartan, ezetimibe, escitalopram, cetirizine, benzonatate, albuterol, montelukast, Olopatadine HCl, and fluticasone. We administered cabotegravir & rilpivirine ER.   Meds ordered this encounter  Medications   cabotegravir & rilpivirine ER (CABENUVA) 600 & 900 MG/3ML injection 1 kit      Follow-up: RTC in 60 days for maintenance injection. Appt has been scheduled.     StJanene MadeiraMSN, NP-C [facility] Infectious  Disease [email or web address] Pager: [phone]: [phone]

## 2020-07-16 NOTE — Assessment & Plan Note (Signed)
Doing well on every 2 month injections of Cabenuva. Tolerating well with only minor injection site reactions. All injections have been administered at appropriate treatment interval.   Will continue with Q46mviral load monitoring during first 6 months for therapeutic treatment monitoring.   Last VL result:  HIV 1 RNA Quant (Copies/mL)  Date Value  05/10/2020 Not Detected    Dose Interval: q24mext Appointment: 09/17/2020

## 2020-07-16 NOTE — Patient Instructions (Addendum)
We gave you your injection of CABENUVA for treatment today.   Your next injection appointment is with Marya Amsler on 09/17/2020    Helpful Tips:  If you experience any knots under the skin please use a warm compress to help.    Some people experience some redness at the site of the injection. This can be normal and should go away soon.   Moving around today is a good idea, if you sit too much it hurts more.   Please call the office to speak with our pharmacy or triage team if you have any questions or concerns.

## 2020-07-16 NOTE — Assessment & Plan Note (Signed)
Will repeat RPR today. No symptoms to warrant empiric treatment or concern for exposure.

## 2020-07-18 LAB — HEPATIC FUNCTION PANEL
AG Ratio: 1.8 (calc) (ref 1.0–2.5)
ALT: 50 U/L — ABNORMAL HIGH (ref 9–46)
AST: 31 U/L (ref 10–35)
Albumin: 4.3 g/dL (ref 3.6–5.1)
Alkaline phosphatase (APISO): 70 U/L (ref 35–144)
Bilirubin, Direct: 0.1 mg/dL (ref 0.0–0.2)
Globulin: 2.4 g/dL (calc) (ref 1.9–3.7)
Indirect Bilirubin: 0.4 mg/dL (calc) (ref 0.2–1.2)
Total Bilirubin: 0.5 mg/dL (ref 0.2–1.2)
Total Protein: 6.7 g/dL (ref 6.1–8.1)

## 2020-07-18 LAB — HIV-1 RNA QUANT-NO REFLEX-BLD
HIV 1 RNA Quant: NOT DETECTED Copies/mL
HIV-1 RNA Quant, Log: NOT DETECTED Log cps/mL

## 2020-07-18 LAB — FLUORESCENT TREPONEMAL AB(FTA)-IGG-BLD: Fluorescent Treponemal ABS: REACTIVE — AB

## 2020-07-18 LAB — RPR TITER: RPR Titer: 1:4 {titer} — ABNORMAL HIGH

## 2020-07-18 LAB — RPR: RPR Ser Ql: REACTIVE — AB

## 2020-07-31 DIAGNOSIS — J342 Deviated nasal septum: Secondary | ICD-10-CM | POA: Diagnosis not present

## 2020-07-31 DIAGNOSIS — R053 Chronic cough: Secondary | ICD-10-CM | POA: Diagnosis not present

## 2020-07-31 DIAGNOSIS — J343 Hypertrophy of nasal turbinates: Secondary | ICD-10-CM | POA: Diagnosis not present

## 2020-07-31 DIAGNOSIS — H903 Sensorineural hearing loss, bilateral: Secondary | ICD-10-CM | POA: Diagnosis not present

## 2020-07-31 DIAGNOSIS — J310 Chronic rhinitis: Secondary | ICD-10-CM | POA: Diagnosis not present

## 2020-08-14 ENCOUNTER — Other Ambulatory Visit: Payer: Self-pay | Admitting: *Deleted

## 2020-08-14 DIAGNOSIS — E785 Hyperlipidemia, unspecified: Secondary | ICD-10-CM

## 2020-08-14 DIAGNOSIS — F32A Depression, unspecified: Secondary | ICD-10-CM

## 2020-08-15 ENCOUNTER — Ambulatory Visit: Payer: Medicare Other | Admitting: Dermatology

## 2020-08-15 ENCOUNTER — Other Ambulatory Visit: Payer: Self-pay

## 2020-08-15 ENCOUNTER — Encounter: Payer: Self-pay | Admitting: Dermatology

## 2020-08-15 DIAGNOSIS — Z1283 Encounter for screening for malignant neoplasm of skin: Secondary | ICD-10-CM

## 2020-08-15 DIAGNOSIS — L813 Cafe au lait spots: Secondary | ICD-10-CM | POA: Diagnosis not present

## 2020-08-15 DIAGNOSIS — L821 Other seborrheic keratosis: Secondary | ICD-10-CM | POA: Diagnosis not present

## 2020-08-15 MED ORDER — ESCITALOPRAM OXALATE 20 MG PO TABS
20.0000 mg | ORAL_TABLET | Freq: Every day | ORAL | 0 refills | Status: DC
Start: 2020-08-15 — End: 2020-11-12

## 2020-08-15 MED ORDER — EZETIMIBE 10 MG PO TABS: 10.0000 mg | ORAL_TABLET | Freq: Every day | ORAL | 0 refills | Status: DC

## 2020-08-15 MED ORDER — LOSARTAN POTASSIUM 50 MG PO TABS: 50.0000 mg | ORAL_TABLET | Freq: Every day | ORAL | 0 refills | Status: DC

## 2020-08-19 ENCOUNTER — Encounter: Payer: Medicare Other | Admitting: Student in an Organized Health Care Education/Training Program

## 2020-08-26 ENCOUNTER — Telehealth: Payer: Self-pay

## 2020-08-26 ENCOUNTER — Other Ambulatory Visit: Payer: Self-pay

## 2020-08-26 ENCOUNTER — Encounter: Payer: Self-pay | Admitting: Family

## 2020-08-26 ENCOUNTER — Ambulatory Visit: Payer: Medicare Other | Admitting: Family

## 2020-08-26 ENCOUNTER — Other Ambulatory Visit (HOSPITAL_COMMUNITY)
Admission: RE | Admit: 2020-08-26 | Discharge: 2020-08-26 | Disposition: A | Discharge status: Home / Self Care | Payer: Medicare Other | Source: Ambulatory Visit | Attending: Family | Admitting: Family

## 2020-08-26 VITALS — BP 147/101 | HR 90

## 2020-08-26 DIAGNOSIS — R369 Urethral discharge, unspecified: Secondary | ICD-10-CM | POA: Diagnosis not present

## 2020-08-26 DIAGNOSIS — Z113 Encounter for screening for infections with a predominantly sexual mode of transmission: Secondary | ICD-10-CM

## 2020-08-26 MED ORDER — CEFTRIAXONE SODIUM 500 MG IJ SOLR
500.0000 mg | Freq: Once | INTRAMUSCULAR | Status: AC
  Administered 2020-08-26: 500 mg via INTRAMUSCULAR

## 2020-08-26 MED ORDER — AZITHROMYCIN 250 MG PO TABS
1000.0000 mg | ORAL_TABLET | Freq: Once | ORAL | Status: AC
Start: 2020-08-26 — End: 2020-08-26
  Administered 2020-08-26: 1000 mg via ORAL

## 2020-08-26 NOTE — Progress Notes (Signed)
Subjective:    Patient ID: Robert Park, male    DOB: 1950/11/20, 70 y.o.   MRN: [id number]  Chief Complaint  Patient presents with   Follow-up    Discharge; greenish discharge; pain with urination; reports about 2 weeks; unprotected sex about 3 weeks    HPI:  Robert Park is a 70 y.o. male with HIV disease presenting today for an acute office visit.   Robert Park has began experiencing worsening greenish penile discharge with some dysuria that started about 2 weeks ago following unprotected source. No fevers, chills or back pain. Has not tried any treatments to date.   Allergies  Allergen Reactions   Elemental Sulfur    Prednisone    Statins     Muscle pains, GI symptoms   Sulfamethoxazole-Trimethoprim       Outpatient Medications Prior to Visit  Medication Sig Dispense Refill   Adalimumab (HUMIRA) 40 MG/0.4ML PSKT Inject into the skin.     albuterol (VENTOLIN HFA) 108 (90 Base) MCG/ACT inhaler Inhale 1 puff into the lungs every 6 (six) hours as needed for wheezing or shortness of breath. 8 g 0   benzonatate (TESSALON PERLES) 100 MG capsule Take 1 capsule (100 mg total) by mouth 3 (three) times daily as needed for cough. 30 capsule 0   cabotegravir & rilpivirine ER (CABENUVA) 600 & 900 MG/3ML injection INJECT 1 KIT INTO THE MUSCLE ONCE FOR 1 DOSE. 6 mL 6   cetirizine (ZYRTEC ALLERGY) 10 MG tablet Take 1 tablet (10 mg total) by mouth daily. 30 tablet 2   escitalopram (LEXAPRO) 20 MG tablet Take 1 tablet (20 mg total) by mouth daily. 90 tablet 0   ezetimibe (ZETIA) 10 MG tablet Take 1 tablet (10 mg total) by mouth daily. 90 tablet 0   fluticasone (FLONASE) 50 MCG/ACT nasal spray Place 1 spray into both nostrils daily. 16 g 2   fluticasone (FLOVENT HFA) 110 MCG/ACT inhaler Inhale 2 puffs into the lungs 2 (two) times daily. 1 each 5   ibuprofen (ADVIL) 200 MG tablet Take 200 mg by mouth every 6 (six) hours as needed.     losartan (COZAAR) 50 MG tablet Take 1 tablet (50 mg total)  by mouth daily. 90 tablet 0   montelukast (SINGULAIR) 10 MG tablet Take 1 tablet (10 mg total) by mouth at bedtime. 30 tablet 5   Multiple Vitamins-Minerals (CENTRUM SILVER 50+MEN) TABS Take by mouth. Three times per week     Olopatadine HCl 0.6 % SOLN Place 2 sprays into the nose daily. 30.5 g 5   tadalafil (CIALIS) 10 MG tablet Take 0.5 tablets (5 mg total) by mouth daily as needed for erectile dysfunction. 20 tablet 0   Vitamin D, Ergocalciferol, (DRISDOL) 1.25 MG (50000 UNIT) CAPS capsule Take 1 capsule (50,000 Units total) by mouth every 7 (seven) days. 12 capsule 2   No facility-administered medications prior to visit.     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[place])    Depression    Diverticulosis    Dyslipidemia    ED (erectile dysfunction)    GERD (gastroesophageal reflux disease)    History of colon polyps    HIV infection ([place])    Hyperlipidemia    Hypertension    Internal hemorrhoids    Osteopenia    Shingles    Sleep apnea  does not wear a c-pap   Ulcerative colitis ([place])    Vitamin D deficiency      Past Surgical History:  Procedure Laterality Date   CARPAL TUNNEL RELEASE Right    x 2   CARPAL TUNNEL RELEASE Left    x 2   CATARACT EXTRACTION, BILATERAL Bilateral 10/2017   COLONOSCOPY     HEMORRHOID SURGERY     MOUTH SURGERY     TONSILLECTOMY AND ADENOIDECTOMY     age 73   UPPER GASTROINTESTINAL ENDOSCOPY     VASECTOMY         Review of Systems  Constitutional:  Negative for appetite change, chills, fatigue, fever and unexpected weight change.  Eyes:  Negative for visual disturbance.  Respiratory:  Negative for cough, chest tightness, shortness of breath and wheezing.   Cardiovascular:  Negative for chest pain and leg swelling.  Gastrointestinal:  Negative for abdominal pain, constipation, diarrhea, nausea and vomiting.  Genitourinary:  Positive for dysuria and  penile discharge. Negative for flank pain, frequency, genital sores, hematuria, penile swelling, testicular pain and urgency.  Skin:  Negative for rash.  Allergic/Immunologic: Negative for immunocompromised state.  Neurological:  Negative for dizziness and headaches.     Objective:    BP (!) 147/101   Pulse 90  Nursing note and vital signs reviewed.  Physical Exam Constitutional:      General: He is not in acute distress.    Appearance: He is well-developed.  Cardiovascular:     Rate and Rhythm: Normal rate and regular rhythm.     Heart sounds: Normal heart sounds.  Pulmonary:     Effort: Pulmonary effort is normal.     Breath sounds: Normal breath sounds.  Skin:    General: Skin is warm and dry.  Neurological:     Mental Status: He is alert and oriented to person, place, and time.  Psychiatric:        Mood and Affect: Mood normal.        Judgment: Judgment normal.     Depression screen [facility] 2/9 06/25/2020 05/16/2020 05/10/2020 04/09/2020 02/05/2020  Decreased Interest 0 0 0 0 0  Down, Depressed, Hopeless 0 0 0 0 0  PHQ - 2 Score 0 0 0 0 0  Altered sleeping 1 - - - -  Tired, decreased energy 1 - - - -  Change in appetite 0 - - - -  Feeling bad or failure about yourself  0 - - - -  Trouble concentrating 0 - - - -  Moving slowly or fidgety/restless 0 - - - -  Suicidal thoughts 0 - - - -  PHQ-9 Score 2 - - - -  Difficult doing work/chores Not difficult at all - - - -       Assessment & Plan:    Patient Active Problem List   Diagnosis Date Noted   History of syphilis 07/16/2020   Mild persistent asthma, uncomplicated 88/82/8003   Seasonal and perennial allergic rhinitis 07/02/2020   Upper airway cough syndrome 06/25/2020   Allergic sinusitis 02/05/2020   Hypertension 08/07/2019   Hyperglycemia 08/07/2019   Hyperlipidemia 08/07/2019   GAD (generalized anxiety disorder) 08/07/2019   Localized osteoarthritis of left hand 08/07/2019   Colon polyps 06/06/2019   Human  immunodeficiency virus (HIV) disease ([place]) 02/16/2019   Screening for STDs (sexually transmitted diseases) 02/16/2019   Osteopenia 02/16/2019   Healthcare maintenance 02/16/2019   Ulcerative colitis without complications ([place]) 49/17/9150     Problem  List Items Addressed This Visit       Other   Screening for STDs (sexually transmitted diseases) (Chronic)   Relevant Orders   Cytology (oral, anal, urethral) ancillary only   Cytology (oral, anal, urethral) ancillary only   Urine cytology ancillary only([place])   RPR   Other Visit Diagnoses     Penile discharge    -  Primary   Relevant Medications   azithromycin (ZITHROMAX) tablet 1,000 mg (Completed)   cefTRIAXone (ROCEPHIN) injection 500 mg (Completed)   Other Relevant Orders   Cytology (oral, anal, urethral) ancillary only   Cytology (oral, anal, urethral) ancillary only   Urine cytology ancillary only([place])   RPR        I am having Hoy Register "Bill" maintain his Humira, Centrum Silver 50+Men, fluticasone, tadalafil, ibuprofen, Vitamin D (Ergocalciferol), cabotegravir & rilpivirine ER, cetirizine, benzonatate, albuterol, montelukast, Olopatadine HCl, fluticasone, escitalopram, ezetimibe, and losartan. We administered azithromycin and cefTRIAXone.   Meds ordered this encounter  Medications   azithromycin (ZITHROMAX) tablet 1,000 mg   cefTRIAXone (ROCEPHIN) injection 500 mg     Follow-up: As needed if symptoms worsen or do not improve.    Terri Piedra, MSN, FNP-C Nurse Practitioner [facility] for Infectious Disease [place] number: [phone]

## 2020-08-26 NOTE — Assessment & Plan Note (Signed)
Robert Park has new acute onset penile discharge concerning for STD following unprotected intercourse. Check urine and and swabs for gonorrhea and chlamydia and blood for RPR to rule out syphilis. Treat with 500 mg ceftriaxone IM once for gonorrhea and 1 gram Azithromycin orally once for chlamydia. Additional treatment pending lab work results as needed. Discussed importance of safe sexual practice. Condoms offered. Follow up if symptoms worsen or do not improve otherwise continue routine follow ups.

## 2020-08-26 NOTE — Telephone Encounter (Signed)
Patient reached out to office to schedule STD screening appointment. Patient states for the past two weeks he has noticed penile discharge and burning sensation while urinating. Describes discharge as green in color.  IS scheduled to see Elna Breslow, FNP later today. Leatrice Jewels, RMA

## 2020-08-26 NOTE — Patient Instructions (Addendum)
Nice to see you.  We will get your lab work today and get you treated.  Additional treatment pending blood work results as needed.   Plan for follow up as planned in 3 weeks.   Hope you get to feeling better!

## 2020-08-27 ENCOUNTER — Ambulatory Visit (INDEPENDENT_AMBULATORY_CARE_PROVIDER_SITE_OTHER): Payer: Medicare Other | Admitting: Allergy & Immunology

## 2020-08-27 ENCOUNTER — Other Ambulatory Visit (HOSPITAL_COMMUNITY): Payer: Self-pay

## 2020-08-27 ENCOUNTER — Encounter: Payer: Self-pay | Admitting: Allergy & Immunology

## 2020-08-27 DIAGNOSIS — J4530 Mild persistent asthma, uncomplicated: Secondary | ICD-10-CM | POA: Diagnosis not present

## 2020-08-27 DIAGNOSIS — J3089 Other allergic rhinitis: Secondary | ICD-10-CM

## 2020-08-27 DIAGNOSIS — G4733 Obstructive sleep apnea (adult) (pediatric): Secondary | ICD-10-CM

## 2020-08-27 DIAGNOSIS — J302 Other seasonal allergic rhinitis: Secondary | ICD-10-CM | POA: Diagnosis not present

## 2020-08-27 LAB — URINE CYTOLOGY ANCILLARY ONLY
Chlamydia: NEGATIVE
Comment: NEGATIVE
Comment: NORMAL
Neisseria Gonorrhea: POSITIVE — AB

## 2020-08-27 LAB — CYTOLOGY, (ORAL, ANAL, URETHRAL) ANCILLARY ONLY
Chlamydia: NEGATIVE
Chlamydia: NEGATIVE
Comment: NEGATIVE
Comment: NEGATIVE
Comment: NORMAL
Comment: NORMAL
Neisseria Gonorrhea: NEGATIVE
Neisseria Gonorrhea: POSITIVE — AB

## 2020-08-27 MED FILL — Cabotegravir 600 MG/3ML & Rilpivirine 900 MG/3ML IM Susp ER: INTRAMUSCULAR | 30 days supply | Qty: 6 | Fill #2 | Status: AC

## 2020-08-27 NOTE — Progress Notes (Signed)
RE: Robert Park MRN: [id number] DOB: 08-Mar-1950 Date of Telemedicine Visit: 08/27/2020  Referring provider: Axel Filler Primary care provider: Axel Filler, MD  Chief Complaint: Follow-up   Telemedicine Follow Up Visit via Telephone: I connected with Hoy Register for a follow up on 08/27/20 by telephone and verified that I am speaking with the correct person using two identifiers.   I discussed the limitations, risks, security and privacy concerns of performing an evaluation and management service by telephone and the availability of in person appointments. I also discussed with the patient that there may be a patient responsible charge related to this service. The patient expressed understanding and agreed to proceed.  Patient is at home.  Provider is at the office.  Visit start time: 3:32 PM Visit end time: 3:55 PM Insurance consent/check in by: Angela Nevin Medical consent and medical assistant/nurse: Bridgett Larsson  History of Present Illness:  He is a 70 y.o. male, who is being followed for mild persistent asthma as well as chronic rhinitis. His previous allergy office visit was in June 2022 with myself.  At that time, his lung testing was normal but his forced vital capacity improved following his albuterol treatment.  We ended up starting him on an inhaled steroid to see if this would help.  We started Flovent 110 mcg 2 puffs twice daily during certain times of the year, increasing to 4 puffs twice daily during flares.  For his rhinitis, would continue his Zyrtec as well as Flonase.  We started him on Singulair 10 mg daily and Patanase 2 sprays per nostril 1-2 times daily as needed.  We did warn him about the side effects of Singulair.    Since last visit, he has mostly done well.  Asthma/Respiratory Symptom History: He feels that the addition of the Flovent has helped with his cough and shortness of breath. . He is doing well.  Evidently, he went to see an ENT at some  point after the last visit and was given gabapentin.  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  He took it for a few days, but felt weird and talk to friends to did not recommend that he continue it.  Evidently, he knows somebody who died of a gabapentin overdose and therefore he stopped it.  He does not feel any worse without it.  Allergic Rhinitis Symptom History: He remains on Zyrtec 10 mg daily.  He does uses on a routine basis.  He has not needed antibiotics at all.  He is still on the Singulair.  He is very happy with how well he is doing.  He tells me about a trip he is taken to [place] in October as well as [place] and [place].  He is thrilled to be traveling again.  Otherwise, there have been no changes to his past medical history, surgical history, family history, or social history.  Assessment and Plan:  Nature is a 70 y.o. male with:  Mild persistent asthma, uncomplicated    Seasonal and perennial allergic rhinitis (grasses, ragweed, weeds, trees, cat, and dog)   Complicated past medical history including well-controlled HIV as well as ulcerative colitis   [name] is doing very well on the current regimen.  We are not can make any medication changes today.  We are going to see him in 4 months or so to see how his lung testing looks.  He is going to make sure he has plenty of medications for his trip abroad.  He is worried  because he will be summer in [place] when he goes and is worried that he might be triggered with regards to his asthma and allergies.  He is going to make sure that he has Flovent.  He does not think that he needs prednisone.  We will see how he is doing at the next visit and be sure to get a spirometry.  Diagnostics: None.  Medication List:  Current Outpatient Medications  Medication Sig Dispense Refill   Adalimumab (HUMIRA) 40 MG/0.4ML PSKT Inject into the skin.     albuterol (VENTOLIN HFA) 108 (90 Base) MCG/ACT inhaler Inhale 1 puff into the lungs  every 6 (six) hours as needed for wheezing or shortness of breath. 8 g 0   cabotegravir & rilpivirine ER (CABENUVA) 600 & 900 MG/3ML injection INJECT 1 KIT INTO THE MUSCLE ONCE FOR 1 DOSE. 6 mL 6   cetirizine (ZYRTEC ALLERGY) 10 MG tablet Take 1 tablet (10 mg total) by mouth daily. 30 tablet 2   escitalopram (LEXAPRO) 20 MG tablet Take 1 tablet (20 mg total) by mouth daily. 90 tablet 0   ezetimibe (ZETIA) 10 MG tablet Take 1 tablet (10 mg total) by mouth daily. 90 tablet 0   fluticasone (FLONASE) 50 MCG/ACT nasal spray Place 1 spray into both nostrils daily. 16 g 2   fluticasone (FLOVENT HFA) 110 MCG/ACT inhaler Inhale 2 puffs into the lungs 2 (two) times daily. 1 each 5   ibuprofen (ADVIL) 200 MG tablet Take 200 mg by mouth every 6 (six) hours as needed.     losartan (COZAAR) 50 MG tablet Take 1 tablet (50 mg total) by mouth daily. 90 tablet 0   montelukast (SINGULAIR) 10 MG tablet Take 1 tablet (10 mg total) by mouth at bedtime. 30 tablet 5   Multiple Vitamins-Minerals (CENTRUM SILVER 50+MEN) TABS Take by mouth. Three times per week     Olopatadine HCl 0.6 % SOLN Place 2 sprays into the nose daily. 30.5 g 5   tadalafil (CIALIS) 10 MG tablet Take 0.5 tablets (5 mg total) by mouth daily as needed for erectile dysfunction. 20 tablet 0   Vitamin D, Ergocalciferol, (DRISDOL) 1.25 MG (50000 UNIT) CAPS capsule Take 1 capsule (50,000 Units total) by mouth every 7 (seven) days. 12 capsule 2   No current facility-administered medications for this visit.   Allergies: Allergies  Allergen Reactions   Elemental Sulfur    Prednisone    Statins     Muscle pains, GI symptoms   Sulfamethoxazole-Trimethoprim    I reviewed his past medical history, social history, family history, and environmental history and no significant changes have been reported from previous visits.  Review of Systems  Constitutional: Negative.  Negative for fever.  HENT: Negative.  Negative for congestion, ear discharge and ear  pain.   Eyes:  Negative for pain, discharge and redness.  Respiratory:  Negative for cough, shortness of breath and wheezing.   Cardiovascular: Negative.  Negative for chest pain and palpitations.  Gastrointestinal:  Negative for abdominal pain.  Endocrine: Negative for cold intolerance and heat intolerance.  Skin: Negative.  Negative for rash.  Allergic/Immunologic: Negative for environmental allergies.  Neurological:  Negative for dizziness and headaches.  Hematological:  Does not bruise/bleed easily.   Objective:  Physical exam not obtained as encounter was done via telephone.   Previous notes and tests were reviewed.  I discussed the assessment and treatment plan with the patient. The patient was provided an opportunity to ask questions and all  were answered. The patient agreed with the plan and demonstrated an understanding of the instructions.   The patient was advised to call back or seek an in-person evaluation if the symptoms worsen or if the condition fails to improve as anticipated.  I provided 22 minutes of non-face-to-face time during this encounter.  It was my pleasure to participate in Javien Tesch care today. Please feel free to contact me with any questions or concerns.   Sincerely,  Valentina Shaggy, MD

## 2020-08-28 ENCOUNTER — Other Ambulatory Visit (HOSPITAL_COMMUNITY): Payer: Self-pay

## 2020-08-28 ENCOUNTER — Telehealth: Payer: Self-pay | Admitting: Student in an Organized Health Care Education/Training Program

## 2020-08-28 LAB — RPR: RPR Ser Ql: REACTIVE — AB

## 2020-08-28 LAB — FLUORESCENT TREPONEMAL AB(FTA)-IGG-BLD: Fluorescent Treponemal ABS: REACTIVE — AB

## 2020-08-28 LAB — RPR TITER: RPR Titer: 1:4 {titer} — ABNORMAL HIGH

## 2020-08-28 NOTE — Telephone Encounter (Signed)
Nancee Liter  NP with [facility]              [phone] calling to report abnormal findings.  A1C 7.5 .  Please call back if any questions.

## 2020-08-28 NOTE — Telephone Encounter (Signed)
Patient returned call, he states he has no s/s of DM.  He has been sick with allergies, but states that is under control now.  He has had some weight fluctuations (10lbs), but he can contribute this to diet and lack of exercise.  He states he is going on an international trip Oct/Nov and will now start watching his diet again.  He wishes to wait until his NOV to retest his A1C.  He voices appreciation for the call. SChaplin, RN,BSN

## 2020-08-28 NOTE — Telephone Encounter (Signed)
TC to patient to assess any symptoms of DM2, VM obtained and message left to call [facility]. SChaplin, RN,BSN

## 2020-08-28 NOTE — Telephone Encounter (Signed)
RTC to [place], NP with [facility] and asked if she could fax A1C lab results to [facility].  She stated that she has no lab results, it was a POC test, a fingerstick only. Patient has an upcoming appt w/ PCP on 10/21/20. Will forward to PCP to advise if he would like patient to come into clinic earlier for labs, resident appt, etc.  Thank you, SChaplin, RN,BSN

## 2020-08-28 NOTE — Telephone Encounter (Signed)
As long as patient is asymptomatic, we can discuss this and retest at our upcoming visit.

## 2020-08-29 ENCOUNTER — Telehealth: Payer: Self-pay

## 2020-08-29 NOTE — Telephone Encounter (Signed)
[facility] Patient Advocate Encounter  Patient's medication Robert Park) have been couriered to [facility] from [facility] and will be administered on patient next office visit on 09/17/20.  [name] , [facility] Patient [facility] for Infectious [facility] Phone: [phone] Fax:  [phone]

## 2020-09-02 ENCOUNTER — Encounter: Payer: Self-pay | Admitting: Dermatology

## 2020-09-02 NOTE — Progress Notes (Signed)
   Follow-Up Visit   Subjective  Robert Park is a 70 y.o. male who presents for the following: Annual Exam (No new concerns).  General skin examination Location:  Duration:  Quality:  Associated Signs/Symptoms: Modifying Factors:  Severity:  Timing: Context:   Objective  Well appearing patient in no apparent distress; mood and affect are within normal limits. Mid Back Full body skin exam. No atypical moles or non mole skin cancers.   Right Lower Leg - Posterior Birth mark: 1.5 centimeter monochrome tan macule, no dermoscopic atypia  Mid Back Multiple tan-brown textured flattopped papules    A full examination was performed including scalp, head, eyes, ears, nose, lips, neck, chest, axillae, abdomen, back, buttocks, bilateral upper extremities, bilateral lower extremities, hands, feet, fingers, toes, fingernails, and toenails. All findings within normal limits unless otherwise noted below.   Assessment & Plan    Screening for malignant neoplasm of skin Mid Back  Annual skin examination, patient encouraged to self examine skin twice annually.  Cafe au lait spots Right Lower Leg - Posterior  Benign no treatment needed.   Seborrheic keratosis Mid Back  Benign no treatment needed.       I, Lavonna Monarch, MD, have reviewed all documentation for this visit.  The documentation on 09/02/20 for the exam, diagnosis, procedures, and orders are all accurate and complete.

## 2020-09-03 ENCOUNTER — Institutional Professional Consult (permissible substitution): Payer: Medicare Other | Admitting: Neurology

## 2020-09-09 ENCOUNTER — Encounter: Payer: Medicare Other | Admitting: Student in an Organized Health Care Education/Training Program

## 2020-09-17 ENCOUNTER — Ambulatory Visit (INDEPENDENT_AMBULATORY_CARE_PROVIDER_SITE_OTHER): Payer: Medicare Other | Admitting: Family

## 2020-09-17 ENCOUNTER — Encounter: Payer: Self-pay | Admitting: Family

## 2020-09-17 ENCOUNTER — Other Ambulatory Visit: Payer: Self-pay

## 2020-09-17 VITALS — BP 141/90 | HR 72 | Temp 98.7°F | Wt 227.0 lb

## 2020-09-17 DIAGNOSIS — B20 Human immunodeficiency virus [HIV] disease: Secondary | ICD-10-CM

## 2020-09-17 MED ORDER — VITAMIN D (ERGOCALCIFEROL) 1.25 MG (50000 UNIT) PO CAPS: 50000.0000 [IU] | ORAL_CAPSULE | ORAL | 2 refills | Status: DC

## 2020-09-17 MED ORDER — CABOTEGRAVIR & RILPIVIRINE ER 600 & 900 MG/3ML IM SUER
1.0000 | Freq: Once | INTRAMUSCULAR | Status: AC
  Administered 2020-09-17: 1 via INTRAMUSCULAR

## 2020-09-17 NOTE — Assessment & Plan Note (Signed)
Mr. Avakian continues to have well-controlled virus with good tolerance to his ART regimen of q 2 month Cabenuva. No signs/symptoms of opportunistic infection. Reviewed previous lab work and discussed plan of care. Check viral load. Q 2 month injection of Cabenuva provided without complication. Plan for follow up in 2 months or sooner if needed.

## 2020-09-17 NOTE — Patient Instructions (Signed)
Nice to see you.  Have a great trip!  Plan for follow up in 2 months or sooner if needed with lab work on the same day.  Have a great day and stay safe!

## 2020-09-17 NOTE — Progress Notes (Signed)
Brief Narrative   Patient ID: Robert Park, male    DOB: [birth year], 70 y.o.   MRN: [id number]  Mr. Robert Park is a 70 year old Caucasian gentleman diagnosed with HIV disease in the 2010 with risk factor of MSM.  Previous history of CMV colitis in 2010-2011.  Initial CD4 count of 450 with viral load of 21,000.  Entered care and CDC stage II.  [id number] negative. Previous medications include Atripla and Genovya.  Currently on Cabenuva.   Subjective:    Chief Complaint  Patient presents with   Follow-up    B20-Cabenuva    HPI:  Robert Park is a 70 y.o. male with HIV disease last seen on 08/26/2020 for acute office visit and 07/16/2020 for routine Cabenuva injection.  Viral load at the time was undetectable. Here today for q 2 month injection of Cabenuva.   Mr. Robert Park has been doing well since his last injection with only minor soreness following injection.  No other adverse side effects.  Feeling well today with no new concerns/complaints.  He is excited about his upcoming trip. Denies fevers, chills, night sweats, headaches, changes in vision, neck pain/stiffness, nausea, diarrhea, vomiting, lesions or rashes.   Allergies  Allergen Reactions   Elemental Sulfur    Prednisone    Statins     Muscle pains, GI symptoms   Sulfamethoxazole-Trimethoprim       Outpatient Medications Prior to Visit  Medication Sig Dispense Refill   Adalimumab (HUMIRA) 40 MG/0.4ML PSKT Inject into the skin.     albuterol (VENTOLIN HFA) 108 (90 Base) MCG/ACT inhaler Inhale 1 puff into the lungs every 6 (six) hours as needed for wheezing or shortness of breath. 8 g 0   cabotegravir & rilpivirine ER (CABENUVA) 600 & 900 MG/3ML injection INJECT 1 KIT INTO THE MUSCLE ONCE FOR 1 DOSE. 6 mL 6   cetirizine (ZYRTEC ALLERGY) 10 MG tablet Take 1 tablet (10 mg total) by mouth daily. 30 tablet 2   escitalopram (LEXAPRO) 20 MG tablet Take 1 tablet (20 mg total) by mouth daily. 90 tablet 0   ezetimibe (ZETIA) 10 MG tablet  Take 1 tablet (10 mg total) by mouth daily. 90 tablet 0   fluticasone (FLONASE) 50 MCG/ACT nasal spray Place 1 spray into both nostrils daily. 16 g 2   fluticasone (FLOVENT HFA) 110 MCG/ACT inhaler Inhale 2 puffs into the lungs 2 (two) times daily. 1 each 5   ibuprofen (ADVIL) 200 MG tablet Take 200 mg by mouth every 6 (six) hours as needed.     losartan (COZAAR) 50 MG tablet Take 1 tablet (50 mg total) by mouth daily. 90 tablet 0   montelukast (SINGULAIR) 10 MG tablet Take 1 tablet (10 mg total) by mouth at bedtime. 30 tablet 5   Multiple Vitamins-Minerals (CENTRUM SILVER 50+MEN) TABS Take by mouth. Three times per week     Olopatadine HCl 0.6 % SOLN Place 2 sprays into the nose daily. 30.5 g 5   tadalafil (CIALIS) 10 MG tablet Take 0.5 tablets (5 mg total) by mouth daily as needed for erectile dysfunction. 20 tablet 0   Vitamin D, Ergocalciferol, (DRISDOL) 1.25 MG (50000 UNIT) CAPS capsule Take 1 capsule (50,000 Units total) by mouth every 7 (seven) days. 12 capsule 2   No facility-administered medications prior to visit.     Past Medical History:  Diagnosis Date   Allergy    Anxiety    Arthritis    Cataract    Bilateral eyes   CMV  colitis ([place])    Depression    Diverticulosis    Dyslipidemia    ED (erectile dysfunction)    GERD (gastroesophageal reflux disease)    History of colon polyps    HIV infection ([place])    Hyperlipidemia    Hypertension    Internal hemorrhoids    Osteopenia    Shingles    Sleep apnea    does not wear a c-pap   Ulcerative colitis ([place])    Vitamin D deficiency      Past Surgical History:  Procedure Laterality Date   CARPAL TUNNEL RELEASE Right    x 2   CARPAL TUNNEL RELEASE Left    x 2   CATARACT EXTRACTION, BILATERAL Bilateral 10/2017   COLONOSCOPY     HEMORRHOID SURGERY     MOUTH SURGERY     TONSILLECTOMY AND ADENOIDECTOMY     age 55   UPPER GASTROINTESTINAL ENDOSCOPY     VASECTOMY        Review of Systems  Constitutional:   Negative for appetite change, chills, fatigue, fever and unexpected weight change.  Eyes:  Negative for visual disturbance.  Respiratory:  Negative for cough, chest tightness, shortness of breath and wheezing.   Cardiovascular:  Negative for chest pain and leg swelling.  Gastrointestinal:  Negative for abdominal pain, constipation, diarrhea, nausea and vomiting.  Genitourinary:  Negative for dysuria, flank pain, frequency, genital sores, hematuria and urgency.  Skin:  Negative for rash.  Allergic/Immunologic: Negative for immunocompromised state.  Neurological:  Negative for dizziness and headaches.     Objective:    BP (!) 141/90   Pulse 72   Temp 98.7 F (37.1 C) (Oral)   Wt 227 lb (103 kg)   BMI 35.55 kg/m  Nursing note and vital signs reviewed.  Physical Exam Constitutional:      General: He is not in acute distress.    Appearance: He is well-developed.  Eyes:     Conjunctiva/sclera: Conjunctivae normal.  Cardiovascular:     Rate and Rhythm: Normal rate and regular rhythm.     Heart sounds: Normal heart sounds. No murmur heard.   No friction rub. No gallop.  Pulmonary:     Effort: Pulmonary effort is normal. No respiratory distress.     Breath sounds: Normal breath sounds. No wheezing or rales.  Chest:     Chest wall: No tenderness.  Abdominal:     General: Bowel sounds are normal.     Palpations: Abdomen is soft.     Tenderness: no abdominal tenderness  Musculoskeletal:     Cervical back: Neck supple.  Lymphadenopathy:     Cervical: No cervical adenopathy.  Skin:    General: Skin is warm and dry.     Findings: No rash.  Neurological:     Mental Status: He is alert and oriented to person, place, and time.  Psychiatric:        Behavior: Behavior normal.        Thought Content: Thought content normal.        Judgment: Judgment normal.     Depression screen [facility] 2/9 09/17/2020 06/25/2020 05/16/2020 05/10/2020 04/09/2020  Decreased Interest 0 0 0 0 0  Down,  Depressed, Hopeless 0 0 0 0 0  PHQ - 2 Score 0 0 0 0 0  Altered sleeping - 1 - - -  Tired, decreased energy - 1 - - -  Change in appetite - 0 - - -  Feeling bad or failure about  yourself  - 0 - - -  Trouble concentrating - 0 - - -  Moving slowly or fidgety/restless - 0 - - -  Suicidal thoughts - 0 - - -  PHQ-9 Score - 2 - - -  Difficult doing work/chores - Not difficult at all - - -       Assessment & Plan:    Patient Active Problem List   Diagnosis Date Noted   Penile discharge 08/26/2020   History of syphilis 07/16/2020   Mild persistent asthma, uncomplicated 14/44/5848   Seasonal and perennial allergic rhinitis 07/02/2020   Upper airway cough syndrome 06/25/2020   Allergic sinusitis 02/05/2020   Hypertension 08/07/2019   Hyperglycemia 08/07/2019   Hyperlipidemia 08/07/2019   GAD (generalized anxiety disorder) 08/07/2019   Localized osteoarthritis of left hand 08/07/2019   Colon polyps 06/06/2019   Human immunodeficiency virus (HIV) disease ([place]) 02/16/2019   Screening for STDs (sexually transmitted diseases) 02/16/2019   Osteopenia 02/16/2019   Healthcare maintenance 02/16/2019   Ulcerative colitis without complications ([place]) 35/07/5730     Problem List Items Addressed This Visit       Other   Human immunodeficiency virus (HIV) disease ([place]) - Primary (Chronic)    Mr. Nazir continues to have well-controlled virus with good tolerance to his ART regimen of q 2 month Cabenuva. No signs/symptoms of opportunistic infection. Reviewed previous lab work and discussed plan of care. Check viral load. Q 2 month injection of Cabenuva provided without complication. Plan for follow up in 2 months or sooner if needed.       Relevant Orders   HIV 1 RNA quant-no reflex-bld   T-helper cell (CD4)- ([facility] clinic only)     I am having Hoy Register "Bill" maintain his Humira, Centrum Silver 50+Men, fluticasone, tadalafil, ibuprofen, cabotegravir & rilpivirine ER, cetirizine,  albuterol, montelukast, Olopatadine HCl, fluticasone, escitalopram, ezetimibe, losartan, and Vitamin D (Ergocalciferol). We administered cabotegravir & rilpivirine ER.   Meds ordered this encounter  Medications   Vitamin D, Ergocalciferol, (DRISDOL) 1.25 MG (50000 UNIT) CAPS capsule    Sig: Take 1 capsule (50,000 Units total) by mouth every 7 (seven) days.    Dispense:  12 capsule    Refill:  2    **Patient requests 90 days supply**    Order Specific Question:   Supervising Provider    Answer:   Baxter Flattery, Caren Griffins [[id number]]   cabotegravir & rilpivirine ER (CABENUVA) 600 & 900 MG/3ML injection 1 kit     Follow-up: Return in about 2 months (around 11/17/2020), or if symptoms worsen or fail to improve.   Terri Piedra, MSN, FNP-C Nurse Practitioner [facility] [place] number: [phone]

## 2020-09-18 LAB — T-HELPER CELL (CD4) - (RCID CLINIC ONLY)
CD4 % Helper T Cell: 37 % (ref 33–65)
CD4 T Cell Abs: 962 /uL (ref 400–1790)

## 2020-09-19 LAB — HIV-1 RNA QUANT-NO REFLEX-BLD
HIV 1 RNA Quant: NOT DETECTED Copies/mL
HIV-1 RNA Quant, Log: NOT DETECTED Log cps/mL

## 2020-10-16 ENCOUNTER — Telehealth: Payer: Self-pay

## 2020-10-16 NOTE — Telephone Encounter (Signed)
Provider page for Humira application has been completed and faxed back to [place] assistance program P: [phone] F: [phone]

## 2020-10-16 NOTE — Telephone Encounter (Signed)
Received a fax from [employer] assist requesting that we complete application for Humira assistance. I spoke with patient, he states that he had a change in income and needs assistance again. Advised that I will complete the provider page and get that faxed back in. Pt will fax his portion once he gathers all of his documents. Pt is also overdue for routine follow up for colitis. Patient has been scheduled for a follow up with Dr. Loletha Carrow on Tuesday, 11/26/20 at 11 am. Pt verbalized understanding and had no concerns at the end of the call.

## 2020-10-21 ENCOUNTER — Encounter: Payer: Self-pay | Admitting: Student in an Organized Health Care Education/Training Program

## 2020-10-21 ENCOUNTER — Ambulatory Visit (INDEPENDENT_AMBULATORY_CARE_PROVIDER_SITE_OTHER): Payer: Medicare Other | Admitting: Student in an Organized Health Care Education/Training Program

## 2020-10-21 ENCOUNTER — Other Ambulatory Visit: Payer: Self-pay

## 2020-10-21 ENCOUNTER — Telehealth: Payer: Self-pay

## 2020-10-21 VITALS — BP 132/91 | HR 82 | Temp 99.1°F | Ht 67.0 in | Wt 229.8 lb

## 2020-10-21 DIAGNOSIS — R7303 Prediabetes: Secondary | ICD-10-CM | POA: Diagnosis not present

## 2020-10-21 DIAGNOSIS — I10 Essential (primary) hypertension: Secondary | ICD-10-CM

## 2020-10-21 DIAGNOSIS — J309 Allergic rhinitis, unspecified: Secondary | ICD-10-CM

## 2020-10-21 DIAGNOSIS — E782 Mixed hyperlipidemia: Secondary | ICD-10-CM | POA: Diagnosis not present

## 2020-10-21 DIAGNOSIS — G4730 Sleep apnea, unspecified: Secondary | ICD-10-CM

## 2020-10-21 DIAGNOSIS — R059 Cough, unspecified: Secondary | ICD-10-CM

## 2020-10-21 DIAGNOSIS — J45909 Unspecified asthma, uncomplicated: Secondary | ICD-10-CM | POA: Diagnosis not present

## 2020-10-21 DIAGNOSIS — Z0000 Encounter for general adult medical examination without abnormal findings: Secondary | ICD-10-CM

## 2020-10-21 DIAGNOSIS — E669 Obesity, unspecified: Secondary | ICD-10-CM

## 2020-10-21 MED ORDER — FLUTICASONE PROPIONATE HFA 110 MCG/ACT IN AERO: 2.0000 | INHALATION_SPRAY | Freq: Two times a day (BID) | RESPIRATORY_TRACT | 5 refills | Status: DC

## 2020-10-21 MED ORDER — ALBUTEROL SULFATE HFA 108 (90 BASE) MCG/ACT IN AERS: 1.0000 | INHALATION_SPRAY | Freq: Four times a day (QID) | RESPIRATORY_TRACT | 0 refills | Status: DC | PRN

## 2020-10-21 MED ORDER — CETIRIZINE HCL 10 MG PO TABS: 10.0000 mg | ORAL_TABLET | Freq: Every day | ORAL | 2 refills | Status: DC

## 2020-10-21 MED ORDER — MONTELUKAST SODIUM 10 MG PO TABS: 10.0000 mg | ORAL_TABLET | Freq: Every day | ORAL | 5 refills | Status: DC

## 2020-10-21 MED ORDER — OLOPATADINE HCL 0.6 % NA SOLN: 2.0000 | Freq: Every day | NASAL | 5 refills | Status: DC

## 2020-10-21 NOTE — Assessment & Plan Note (Signed)
Up-to-date on age-appropriate cancer screening and vaccine schedule.

## 2020-10-21 NOTE — Telephone Encounter (Signed)
[employer] is reviewing your PA request. Typically an electronic response will be received within 24-72 hours.

## 2020-10-21 NOTE — Assessment & Plan Note (Addendum)
Hemoglobin A1c increased a little over the last 1 year from 6.1% to 6.7%.  This is consistent with metabolic syndrome given his underlying hyperlipidemia, hypertension, and obesity.  Anticipate he will require medications in the next few years.  We talked about the natural progression of diabetes today, he told me about his nutrition and exercise which seem excellent.  We will continue checking the A1c annually.  Recommended against starting medications today, but will follow up with her closely in 1 year.

## 2020-10-21 NOTE — Assessment & Plan Note (Addendum)
Recent labs at [facility] showed hyperlipidemia: LDL 159, HDL 56, Total 253.  Patient is at elevated risk of ischemic events over the next 10 years.  Not tolerant of statin medications due to side effects.  We are continuing with second line medications for primary prevention with ezetimibe 10 mg daily.

## 2020-10-21 NOTE — Assessment & Plan Note (Signed)
Blood pressure under adequate control at today's visit.  Labs recently checked at [facility], creatinine normal at 0.9.  Plan to continue with losartan 50 mg daily.

## 2020-10-21 NOTE — Telephone Encounter (Signed)
PA  for pt ( olopatadine 0.6% nasal spray )  was done on cover my meds  and sent with office notes  from 06/25/20.Marland Kitchen awaiting approval or denial

## 2020-10-21 NOTE — Progress Notes (Signed)
   Assessment and Plan:  See Encounters tab for problem-based medical decision making.   __________________________________________________________  HPI:   70 year old person living with HIV and ulcerative colitis here for follow-up of hypertension.  Patient is doing well with no acute complaints today.  He has a number of subspecialists managing some of his chronic problems including:  [facility] - HIV Dr. Loletha Carrow - Ulcerative Colitis Dr. Ernst Bowler - Asthma and sinusitis ENT - Hearing loss and upper airway cough syndrome  He works at [employer], they offered him a full set of labs as part of his employment benefits this year.  I reviewed these labs with him, most notable was persistent hyperlipidemia, worsening hyperglycemia with an A1c of 6.7%, normal renal function, normal CBC, normal vitamin D level.  Denies any recent acute illness, no hospitalizations.  Did have a significant asthma exacerbation last June, doing much better with Flovent inhaler and after allergy testing.  Denies any shortness of breath with exertion, no chest pain, no fevers or chills.  He is up-to-date on all of his vaccines schedules.  He is very excited about the trip to [place] for 17 days, describes it as a Act[place].  He does have a history of sleep apnea, not currently using a mask because of difficulties tolerating.  Has a sleep study scheduled, but needs to reschedule because of his upcoming vacation.  We will follow-up with this.  __________________________________________________________  Problem List: Patient Active Problem List   Diagnosis Date Noted   Hypertension 08/07/2019    Priority: High   Prediabetes 08/07/2019    Priority: High   Hyperlipidemia 08/07/2019    Priority: High   Human immunodeficiency virus (HIV) disease ([place]) 02/16/2019    Priority: High   Ulcerative colitis without complications ([place]) 07/86/7544    Priority: High   Mild persistent asthma,  uncomplicated 92/01/69    Priority: Medium    Seasonal and perennial allergic rhinitis 07/02/2020    Priority: Medium    Upper airway cough syndrome 06/25/2020    Priority: Medium    Allergic sinusitis 02/05/2020    Priority: Medium    GAD (generalized anxiety disorder) 08/07/2019    Priority: Medium    Localized osteoarthritis of left hand 08/07/2019    Priority: Low   Colon polyps 06/06/2019    Priority: Low   Screening for STDs (sexually transmitted diseases) 02/16/2019    Priority: Low   Osteopenia 02/16/2019    Priority: [place] maintenance 02/16/2019    Priority: Low    Medications: Reconciled today in Epic __________________________________________________________  Physical Exam:  Vital Signs: Vitals:   10/21/20 0912  BP: (!) 132/91  Pulse: 82  Temp: 99.1 F (37.3 C)  TempSrc: Oral  SpO2: 97%  Weight: 229 lb 12.8 oz (104.2 kg)  Height: 5' 7"  (1.702 m)    Gen: Well appearing, NAD ENT: OP clear without erythema or exudate.  CV: RRR, no murmurs Pulm: Normal effort, CTA throughout, no wheezing Abd: Soft, NT, ND Ext: Warm, no edema, normal joints Skin: No atypical appearing moles. No rashes

## 2020-10-22 NOTE — Telephone Encounter (Signed)
DECISION :    Approved on October 10   Request Reference Number: [id number]. OLOPATADINE SPR 0.6% is approved through 01/11/2022. Your patient may now fill this prescription and it will be covered. Drug Olopatadine HCl 0.6% solution Form [employer] Medicare Part D Electronic Prior Authorization Form (2017 NCPDP)    COPY SENT TO PHARMACY

## 2020-11-08 ENCOUNTER — Other Ambulatory Visit (HOSPITAL_COMMUNITY): Payer: Self-pay

## 2020-11-08 MED FILL — Cabotegravir 600 MG/3ML & Rilpivirine 900 MG/3ML IM Susp ER: INTRAMUSCULAR | 30 days supply | Qty: 6 | Fill #3 | Status: AC

## 2020-11-11 ENCOUNTER — Telehealth: Payer: Self-pay

## 2020-11-11 NOTE — Telephone Encounter (Signed)
[facility] Patient Advocate Encounter  Patient's medication Robert Park) have been couriered to [facility] from [facility] and will be administered on patients next office visit on 11/18/20.  Robert Park , [facility] Patient [facility] Phone: [phone] Fax:  [phone]

## 2020-11-12 ENCOUNTER — Other Ambulatory Visit: Payer: Self-pay

## 2020-11-12 DIAGNOSIS — E785 Hyperlipidemia, unspecified: Secondary | ICD-10-CM

## 2020-11-12 DIAGNOSIS — F32A Depression, unspecified: Secondary | ICD-10-CM

## 2020-11-12 MED ORDER — ESCITALOPRAM OXALATE 20 MG PO TABS: 20.0000 mg | ORAL_TABLET | Freq: Every day | ORAL | 3 refills | Status: DC

## 2020-11-12 MED ORDER — EZETIMIBE 10 MG PO TABS: 10.0000 mg | ORAL_TABLET | Freq: Every day | ORAL | 3 refills | Status: DC

## 2020-11-12 MED ORDER — LOSARTAN POTASSIUM 50 MG PO TABS: 50.0000 mg | ORAL_TABLET | Freq: Every day | ORAL | 3 refills | Status: DC

## 2020-11-18 ENCOUNTER — Encounter: Payer: Medicare Other | Admitting: Family

## 2020-11-18 ENCOUNTER — Telehealth: Payer: Self-pay

## 2020-11-18 NOTE — Telephone Encounter (Signed)
Patient called wanting to know if he was a candidate for Paxlovid as he tested positive for COVID. Today is day 6, relayed to him that treatment must be started within 5 days. He says he is still having mild symptoms like aches, fatigue, and sinus symptoms. Denies difficulty breathing or shortness of breath.   Rescheduled [place] appointment for this Thursday 11/10 per Terri Piedra, NP.   Beryle Flock, RN

## 2020-11-21 ENCOUNTER — Other Ambulatory Visit: Payer: Self-pay

## 2020-11-21 ENCOUNTER — Ambulatory Visit (INDEPENDENT_AMBULATORY_CARE_PROVIDER_SITE_OTHER): Payer: Medicare Other | Admitting: Family

## 2020-11-21 ENCOUNTER — Encounter: Payer: Self-pay | Admitting: Family

## 2020-11-21 VITALS — BP 146/97 | HR 77 | Temp 98.8°F

## 2020-11-21 DIAGNOSIS — U071 COVID-19: Secondary | ICD-10-CM | POA: Insufficient documentation

## 2020-11-21 DIAGNOSIS — I10 Essential (primary) hypertension: Secondary | ICD-10-CM | POA: Diagnosis not present

## 2020-11-21 DIAGNOSIS — Z20822 Contact with and (suspected) exposure to covid-19: Secondary | ICD-10-CM | POA: Diagnosis not present

## 2020-11-21 DIAGNOSIS — B20 Human immunodeficiency virus [HIV] disease: Secondary | ICD-10-CM | POA: Diagnosis not present

## 2020-11-21 MED ORDER — CABOTEGRAVIR & RILPIVIRINE ER 600 & 900 MG/3ML IM SUER
1.0000 | Freq: Once | INTRAMUSCULAR | Status: AC
  Administered 2020-11-21: 1 via INTRAMUSCULAR

## 2020-11-21 NOTE — Progress Notes (Signed)
Brief Narrative   Patient ID: Wendell Nicoson, male    DOB: 12/12/1950, 70 y.o.   MRN: [id number]  Mr. Gironda is a 70 year old Caucasian gentleman diagnosed with HIV disease in the 2010 with risk factor of MSM.  Previous history of CMV colitis in 2010-2011.  Initial CD4 count of 450 with viral load of 21,000.  Entered care and CDC stage II.  [id number] negative. Previous medications include Atripla and Genovya.  Currently on Cabenuva.   Subjective:    Chief Complaint  Patient presents with   Follow-up    B20     HPI:  Mayjor Ager is a 70 y.o. male with HIV disease last seen on 09/17/2020 for routine every 2 monthly injection of Cabenuva.  Viral load at the time was undetectable with CD4 count of 962.  Here today for Cabenuva injection.  Mr. Brier has been doing well since his last injection with no adverse side effects.  In the interim he has traveled to [place] and was recently diagnosed with COVID from which he continues to recover slowly.  Otherwise feeling well today.  Allergies  Allergen Reactions   Elemental Sulfur    Prednisone    Statins     Muscle pains, GI symptoms   Sulfamethoxazole-Trimethoprim       Outpatient Medications Prior to Visit  Medication Sig Dispense Refill   Adalimumab (HUMIRA) 40 MG/0.4ML PSKT Inject into the skin.     albuterol (VENTOLIN HFA) 108 (90 Base) MCG/ACT inhaler Inhale 1 puff into the lungs every 6 (six) hours as needed for wheezing or shortness of breath. 8 g 0   cabotegravir & rilpivirine ER (CABENUVA) 600 & 900 MG/3ML injection INJECT 1 KIT INTO THE MUSCLE ONCE FOR 1 DOSE. 6 mL 6   cetirizine (ZYRTEC ALLERGY) 10 MG tablet Take 1 tablet (10 mg total) by mouth daily. 30 tablet 2   escitalopram (LEXAPRO) 20 MG tablet Take 1 tablet (20 mg total) by mouth daily. 90 tablet 3   ezetimibe (ZETIA) 10 MG tablet Take 1 tablet (10 mg total) by mouth daily. 90 tablet 3   fluticasone (FLONASE) 50 MCG/ACT nasal spray Place 1 spray into both nostrils daily.  16 g 2   fluticasone (FLOVENT HFA) 110 MCG/ACT inhaler Inhale 2 puffs into the lungs 2 (two) times daily. 1 each 5   ibuprofen (ADVIL) 200 MG tablet Take 200 mg by mouth every 6 (six) hours as needed.     losartan (COZAAR) 50 MG tablet Take 1 tablet (50 mg total) by mouth daily. 90 tablet 3   montelukast (SINGULAIR) 10 MG tablet Take 1 tablet (10 mg total) by mouth at bedtime. 30 tablet 5   Multiple Vitamins-Minerals (CENTRUM SILVER 50+MEN) TABS Take by mouth. Three times per week     Olopatadine HCl 0.6 % SOLN Place 2 sprays into the nose daily. 30.5 g 5   tadalafil (CIALIS) 10 MG tablet Take 0.5 tablets (5 mg total) by mouth daily as needed for erectile dysfunction. 20 tablet 0   No facility-administered medications prior to visit.     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[place])    Depression    Diverticulosis    Dyslipidemia    ED (erectile dysfunction)    GERD (gastroesophageal reflux disease)    History of colon polyps    HIV infection ([place])  Hyperlipidemia    Hypertension    Internal hemorrhoids    Osteopenia    Shingles    Sleep apnea    does not wear a c-pap   Ulcerative colitis ([place])    Vitamin D deficiency      Past Surgical History:  Procedure Laterality Date   CARPAL TUNNEL RELEASE Right    x 2   CARPAL TUNNEL RELEASE Left    x 2   CATARACT EXTRACTION, BILATERAL Bilateral 10/2017   COLONOSCOPY     HEMORRHOID SURGERY     MOUTH SURGERY     TONSILLECTOMY AND ADENOIDECTOMY     age 62   UPPER GASTROINTESTINAL ENDOSCOPY     VASECTOMY        Review of Systems  Constitutional:  Negative for appetite change, chills, fatigue, fever and unexpected weight change.  Eyes:  Negative for visual disturbance.  Respiratory:  Positive for cough. Negative for chest tightness, shortness of breath and wheezing.   Cardiovascular:  Negative for chest pain and leg swelling.   Gastrointestinal:  Negative for abdominal pain, constipation, diarrhea, nausea and vomiting.  Genitourinary:  Negative for dysuria, flank pain, frequency, genital sores, hematuria and urgency.  Skin:  Negative for rash.  Allergic/Immunologic: Negative for immunocompromised state.  Neurological:  Negative for dizziness and headaches.     Objective:    BP (!) 146/97   Pulse 77   Temp 98.8 F (37.1 C) (Oral)   SpO2 97%  Nursing note and vital signs reviewed.  Physical Exam Constitutional:      General: He is not in acute distress.    Appearance: He is well-developed.  Cardiovascular:     Rate and Rhythm: Normal rate and regular rhythm.     Heart sounds: Normal heart sounds.  Pulmonary:     Effort: Pulmonary effort is normal.     Breath sounds: Normal breath sounds.  Skin:    General: Skin is warm and dry.  Neurological:     Mental Status: He is alert and oriented to person, place, and time.  Psychiatric:        Behavior: Behavior normal.        Thought Content: Thought content normal.        Judgment: Judgment normal.     Depression screen [facility] 2/9 10/21/2020 09/17/2020 06/25/2020 05/16/2020 05/10/2020  Decreased Interest 0 0 0 0 0  Down, Depressed, Hopeless 0 0 0 0 0  PHQ - 2 Score 0 0 0 0 0  Altered sleeping - - 1 - -  Tired, decreased energy - - 1 - -  Change in appetite - - 0 - -  Feeling bad or failure about yourself  - - 0 - -  Trouble concentrating - - 0 - -  Moving slowly or fidgety/restless - - 0 - -  Suicidal thoughts - - 0 - -  PHQ-9 Score - - 2 - -  Difficult doing work/chores - - Not difficult at all - -       Assessment & Plan:    Patient Active Problem List   Diagnosis Date Noted   COVID-19 virus infection 11/21/2020   Mild persistent asthma, uncomplicated 60/63/0160   Seasonal and perennial allergic rhinitis 07/02/2020   Upper airway cough syndrome 06/25/2020   Allergic sinusitis 02/05/2020   Hypertension 08/07/2019   Prediabetes 08/07/2019    Hyperlipidemia 08/07/2019   GAD (generalized anxiety disorder) 08/07/2019   Localized osteoarthritis of left hand 08/07/2019   Colon polyps 06/06/2019  Human immunodeficiency virus (HIV) disease ([place]) 02/16/2019   Screening for STDs (sexually transmitted diseases) 02/16/2019   Osteopenia 02/16/2019   Healthcare maintenance 02/16/2019   Ulcerative colitis without complications ([place]) 34/62/1947     Problem List Items Addressed This Visit       Other   Human immunodeficiency virus (HIV) disease ([place]) - Primary (Chronic)    Mr. Heupel continues to have well-controlled virus with good adherence and tolerance to [place].  No signs/symptoms of opportunistic infection.  We reviewed lab work and discussed plan of care.  Every 2 monthly dose of Cabenuva provided with no complications.  Plan for follow-up in 2 months or sooner if needed.      COVID-19 virus infection    Mr. Winterhalter has COVID-19 infection following his recent trip to [place].  He is outside of the window for antivirals we will continue symptomatic management as needed.  He will need to stay out of work until he is fever free for 24 hours off of antipyretic medication and his symptoms continue to improve.  Follow-up as needed.        I am having Hoy Register "[name]" maintain his Humira, Centrum Silver 50+Men, fluticasone, tadalafil, ibuprofen, cabotegravir & rilpivirine ER, fluticasone, albuterol, cetirizine, montelukast, Olopatadine HCl, escitalopram, ezetimibe, and losartan. We administered cabotegravir & rilpivirine ER.   Meds ordered this encounter  Medications   cabotegravir & rilpivirine ER (CABENUVA) 600 & 900 MG/3ML injection 1 kit     Follow-up: Return in about 2 months (around 01/21/2021), or if symptoms worsen or fail to improve.   Terri Piedra, MSN, FNP-C Nurse Practitioner [facility] [place] number: [phone]

## 2020-11-21 NOTE — Assessment & Plan Note (Signed)
Robert Park has COVID-19 infection following his recent trip to [place].  He is outside of the window for antivirals we will continue symptomatic management as needed.  He will need to stay out of work until he is fever free for 24 hours off of antipyretic medication and his symptoms continue to improve.  Follow-up as needed.

## 2020-11-21 NOTE — Assessment & Plan Note (Signed)
Robert Park continues to have well-controlled virus with good adherence and tolerance to [place].  No signs/symptoms of opportunistic infection.  We reviewed lab work and discussed plan of care.  Every 2 monthly dose of Cabenuva provided with no complications.  Plan for follow-up in 2 months or sooner if needed.

## 2020-11-21 NOTE — Patient Instructions (Signed)
Nice to see you.  Plan for follow up in 2 months or sooner if needed.   Have a great day and stay safe!

## 2020-11-26 ENCOUNTER — Ambulatory Visit: Payer: Medicare Other | Admitting: Gastroenterology

## 2020-11-26 ENCOUNTER — Encounter: Payer: Self-pay | Admitting: Gastroenterology

## 2020-11-26 VITALS — BP 148/98 | HR 80 | Ht 67.0 in | Wt 231.0 lb

## 2020-11-26 DIAGNOSIS — Z79899 Other long term (current) drug therapy: Secondary | ICD-10-CM

## 2020-11-26 DIAGNOSIS — K5130 Ulcerative (chronic) rectosigmoiditis without complications: Secondary | ICD-10-CM

## 2020-11-26 MED ORDER — PLENVU 140 G PO SOLR: 140.0000 g | ORAL | 0 refills | Status: DC

## 2020-11-26 NOTE — Progress Notes (Signed)
[facility] Progress Note  Chief Complaint: Ulcerative colitis  Subjective  History: I saw Bill in follow-up today for his longstanding ulcerative colitis.  Extensive clinical details outlined in that May 2021 office consult note.  He had documented proctosigmoiditis, but with incomplete records I wondered if he may have had pancolitis at some point.  Durable remission on Humira.  He had a colonoscopy with me on 06/21/2019 with no active colitis seen, extensive surveillance biopsies showed no dysplasia, and a small sigmoid colon tubular adenoma was removed.  2-year surveillance interval recommended. _________________  [name] is glad to report that he is feeling well from a digestive standpoint.  He and his partner Osvaldo Angst recently traveled to [place], and on the way home at a stop over in [place], [place] contracted [place].  He is recovering and has some residual dry cough. He denies chronic abdominal pain, diarrhea or bleeding.  He feels as if the colitis is under excellent control, and he still takes Humira every other week without difficulty.  He also is up-to-date on pneumococcal and shingles vaccines since his last visit with me.  ROS: Cardiovascular:  no chest pain Respiratory: Postinfectious cough as noted above.  No dyspnea or wheezing. Remainder of systems negative except as above The patient's Past Medical, Family and Social History were reviewed and are on file in the EMR. 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[place])    Depression    Diverticulosis    Dyslipidemia    ED (erectile dysfunction)    GERD (gastroesophageal reflux disease)    History of colon polyps    HIV infection ([place])    Hyperlipidemia    Hypertension    Internal hemorrhoids    Osteopenia    Shingles    Sleep apnea    does not wear a c-pap   Ulcerative colitis ([place])    Vitamin D deficiency    11/21/2020  infectious disease clinic was reviewed (regarding patient's HIV management)  Patient reportedly had a recent COVID infection Objective:  Med list reviewed  Current Outpatient Medications:    Adalimumab (HUMIRA) 40 MG/0.4ML PSKT, Inject into the skin., Disp: , Rfl:    albuterol (VENTOLIN HFA) 108 (90 Base) MCG/ACT inhaler, Inhale 1 puff into the lungs every 6 (six) hours as needed for wheezing or shortness of breath., Disp: 8 g, Rfl: 0   cabotegravir & rilpivirine ER (CABENUVA) 600 & 900 MG/3ML injection, INJECT 1 KIT INTO THE MUSCLE ONCE FOR 1 DOSE., Disp: 6 mL, Rfl: 6   cetirizine (ZYRTEC ALLERGY) 10 MG tablet, Take 1 tablet (10 mg total) by mouth daily., Disp: 30 tablet, Rfl: 2   escitalopram (LEXAPRO) 20 MG tablet, Take 1 tablet (20 mg total) by mouth daily., Disp: 90 tablet, Rfl: 3   ezetimibe (ZETIA) 10 MG tablet, Take 1 tablet (10 mg total) by mouth daily., Disp: 90 tablet, Rfl: 3   fluticasone (FLONASE) 50 MCG/ACT nasal spray, Place 1 spray into both nostrils daily., Disp: 16 g, Rfl: 2   fluticasone (FLOVENT HFA) 110 MCG/ACT inhaler, Inhale 2 puffs into the lungs 2 (two) times daily., Disp: 1 each, Rfl: 5   ibuprofen (ADVIL) 200 MG tablet, Take 200 mg by mouth every 6 (six) hours as needed., Disp: , Rfl:    losartan (COZAAR) 50 MG tablet, Take 1 tablet (50 mg total) by  mouth daily., Disp: 90 tablet, Rfl: 3   montelukast (SINGULAIR) 10 MG tablet, Take 1 tablet (10 mg total) by mouth at bedtime., Disp: 30 tablet, Rfl: 5   Multiple Vitamins-Minerals (CENTRUM SILVER 50+MEN) TABS, Take by mouth. Three times per week, Disp: , Rfl:    Olopatadine HCl 0.6 % SOLN, Place 2 sprays into the nose daily., Disp: 30.5 g, Rfl: 5   tadalafil (CIALIS) 10 MG tablet, Take 0.5 tablets (5 mg total) by mouth daily as needed for erectile dysfunction., Disp: 20 tablet, Rfl: 0   Vital signs in last 24 hrs: Vitals:   11/26/20 1115  BP: (!) 148/98  Pulse: 80  SpO2: 96%   Wt Readings from Last 3 Encounters:   11/26/20 231 lb (104.8 kg)  10/21/20 229 lb 12.8 oz (104.2 kg)  09/17/20 227 lb (103 kg)    Physical Exam  Well-appearing HEENT: sclera anicteric, oral mucosa moist without lesions Neck: supple, no thyromegaly, JVD or lymphadenopathy Cardiac: RRR without murmurs, S1S2 heard, no peripheral edema Pulm: clear to auscultation bilaterally, normal RR and effort noted Abdomen: soft, no tenderness, with active bowel sounds. No guarding or palpable hepatosplenomegaly. Skin; warm and dry, no jaundice or rash  Labs:  Undetected HIV viral load on 09/17/2020 ___________________________________________ Radiologic studies:   ____________________________________________ Other:   _____________________________________________ Assessment & Plan  Assessment: Encounter Diagnoses  Name Primary?   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  Tolerating treatment well, up-to-date on vaccinations and he had an adenomatous sigmoid polyp removed on last colonoscopy 17 months ago.  He was concerned about the cancer risk with his family history and his personal history of colitis.  Current guidelines recommend follow-up exam in 18 to 24 months, and he is about in that range.  Therefore, we decided to schedule his colonoscopy in the near future.  He was agreeable after discussion of procedure and risks.  The benefits and risks of the planned procedure were described in detail with the patient or (when appropriate) their health care proxy.  Risks were outlined as including, but not limited to, bleeding, infection, perforation, adverse medication reaction leading to cardiac or pulmonary decompensation, pancreatitis (if ERCP).  The limitation of incomplete mucosal visualization was also discussed.  No guarantees or warranties were given.   Continue current Humira  therapy   32 minutes were spent on this encounter (including chart review, history/exam, counseling/coordination of care, and documentation) > 50% of that time was spent on counseling and coordination of care.   Nelida Meuse III

## 2020-11-26 NOTE — Patient Instructions (Signed)
If you are age 69 or older, your body mass index should be between 23-30. Your Body mass index is 36.18 kg/m. If this is out of the aforementioned range listed, please consider follow up with your Primary Care Provider.  If you are age 62 or younger, your body mass index should be between 19-25. Your Body mass index is 36.18 kg/m. If this is out of the aformentioned range listed, please consider follow up with your Primary Care Provider.   ________________________________________________________  The [facility] providers would like to encourage you to use [facility] to communicate with providers for non-urgent requests or questions.  Due to long hold times on the telephone, sending your provider a message by [facility] may be a faster and more efficient way to get a response.  Please allow 48 business hours for a response.  Please remember that this is for non-urgent requests.  _______________________________________________________  Robert Park have been scheduled for a colonoscopy. Please follow written instructions given to you at your visit today.  Please pick up your prep supplies at the pharmacy within the next 1-3 days. If you use inhalers (even only as needed), please bring them with you on the day of your procedure.  It was a pleasure to see you today!  Thank you for trusting me with your gastrointestinal care!

## 2020-12-16 ENCOUNTER — Other Ambulatory Visit: Payer: Self-pay

## 2020-12-16 DIAGNOSIS — R059 Cough, unspecified: Secondary | ICD-10-CM

## 2020-12-17 MED ORDER — ALBUTEROL SULFATE HFA 108 (90 BASE) MCG/ACT IN AERS: 1.0000 | INHALATION_SPRAY | Freq: Four times a day (QID) | RESPIRATORY_TRACT | 0 refills | Status: DC | PRN

## 2020-12-30 ENCOUNTER — Encounter: Payer: Self-pay | Admitting: Infectious Diseases

## 2020-12-31 ENCOUNTER — Other Ambulatory Visit (HOSPITAL_COMMUNITY): Payer: Self-pay

## 2020-12-31 MED FILL — Cabotegravir 600 MG/3ML & Rilpivirine 900 MG/3ML IM Susp ER: INTRAMUSCULAR | 30 days supply | Qty: 6 | Fill #4 | Status: AC

## 2021-01-01 ENCOUNTER — Other Ambulatory Visit (HOSPITAL_COMMUNITY): Payer: Self-pay

## 2021-01-02 ENCOUNTER — Telehealth: Payer: Self-pay

## 2021-01-02 NOTE — Telephone Encounter (Signed)
[facility] Patient Advocate Encounter  Patient's medication Kern Reap) have been couriered to [facility] from [facility] and will be administered on the patient next office visit on 01/21/21.  [name] , [facility] Patient [facility] Phone: [phone] Fax:  [phone]

## 2021-01-17 ENCOUNTER — Ambulatory Visit: Payer: Medicare Other | Admitting: Family

## 2021-01-17 ENCOUNTER — Other Ambulatory Visit: Payer: Self-pay | Admitting: Student in an Organized Health Care Education/Training Program

## 2021-01-17 MED ORDER — TADALAFIL 10 MG PO TABS: 5.0000 mg | ORAL_TABLET | Freq: Every day | ORAL | 0 refills | Status: DC | PRN

## 2021-01-21 ENCOUNTER — Other Ambulatory Visit: Payer: Self-pay

## 2021-01-21 ENCOUNTER — Other Ambulatory Visit: Payer: Self-pay | Admitting: Family

## 2021-01-21 ENCOUNTER — Ambulatory Visit (INDEPENDENT_AMBULATORY_CARE_PROVIDER_SITE_OTHER): Payer: Medicare Other

## 2021-01-21 ENCOUNTER — Ambulatory Visit (INDEPENDENT_AMBULATORY_CARE_PROVIDER_SITE_OTHER): Payer: Medicare Other | Admitting: Pharmacist

## 2021-01-21 DIAGNOSIS — Z23 Encounter for immunization: Secondary | ICD-10-CM

## 2021-01-21 DIAGNOSIS — B20 Human immunodeficiency virus [HIV] disease: Secondary | ICD-10-CM | POA: Diagnosis not present

## 2021-01-21 MED ORDER — CABOTEGRAVIR & RILPIVIRINE ER 600 & 900 MG/3ML IM SUER
1.0000 | Freq: Once | INTRAMUSCULAR | Status: AC
  Administered 2021-01-21: 1 via INTRAMUSCULAR

## 2021-01-21 NOTE — Progress Notes (Signed)
HPI: Traevon Meiring is a 71 y.o. male who presents to the [facility] for [place] administration.  Patient Active Problem List   Diagnosis Date Noted   COVID-19 virus infection 11/21/2020   Mild persistent asthma, uncomplicated 31/59/4585   Seasonal and perennial allergic rhinitis 07/02/2020   Upper airway cough syndrome 06/25/2020   Allergic sinusitis 02/05/2020   Hypertension 08/07/2019   Prediabetes 08/07/2019   Hyperlipidemia 08/07/2019   GAD (generalized anxiety disorder) 08/07/2019   Localized osteoarthritis of left hand 08/07/2019   Colon polyps 06/06/2019   Human immunodeficiency virus (HIV) disease ([place]) 02/16/2019   Screening for STDs (sexually transmitted diseases) 02/16/2019   Osteopenia 02/16/2019   Healthcare maintenance 02/16/2019   Ulcerative colitis without complications ([place]) 92/92/4462    Patient's Medications  New Prescriptions   No medications on file  Previous Medications   ADALIMUMAB (HUMIRA) 40 MG/0.4ML PSKT    Inject into the skin.   ALBUTEROL (VENTOLIN HFA) 108 (90 BASE) MCG/ACT INHALER    Inhale 1 puff into the lungs every 6 (six) hours as needed for wheezing or shortness of breath.   CABOTEGRAVIR & RILPIVIRINE ER (CABENUVA) 600 & 900 MG/3ML INJECTION    INJECT 1 KIT INTO THE MUSCLE ONCE FOR 1 DOSE.   CETIRIZINE (ZYRTEC ALLERGY) 10 MG TABLET    Take 1 tablet (10 mg total) by mouth daily.   ESCITALOPRAM (LEXAPRO) 20 MG TABLET    Take 1 tablet (20 mg total) by mouth daily.   EZETIMIBE (ZETIA) 10 MG TABLET    Take 1 tablet (10 mg total) by mouth daily.   FLUTICASONE (FLONASE) 50 MCG/ACT NASAL SPRAY    Place 1 spray into both nostrils daily.   FLUTICASONE (FLOVENT HFA) 110 MCG/ACT INHALER    Inhale 2 puffs into the lungs 2 (two) times daily.   IBUPROFEN (ADVIL) 200 MG TABLET    Take 200 mg by mouth every 6 (six) hours as needed.   LOSARTAN (COZAAR) 50 MG TABLET    Take 1 tablet (50 mg total) by mouth daily.   MONTELUKAST (SINGULAIR) 10 MG TABLET     Take 1 tablet (10 mg total) by mouth at bedtime.   MULTIPLE VITAMINS-MINERALS (CENTRUM SILVER 50+MEN) TABS    Take by mouth. Three times per week   OLOPATADINE HCL 0.6 % SOLN    Place 2 sprays into the nose daily.   PEG-KCL-NACL-NASULF-NA ASC-C (PLENVU) 140 G SOLR    Take 140 g by mouth as directed. Manufacturer's coupon Universal coupon code:BIN: P2366821; GROUP: [id number]; PCN: [id number]; ID: [id number]; PAY NO MORE $50   TADALAFIL (CIALIS) 10 MG TABLET    Take 0.5 tablets (5 mg total) by mouth daily as needed for erectile dysfunction.  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[place])    Depression    Diverticulosis    Dyslipidemia    ED (erectile dysfunction)    GERD (gastroesophageal reflux disease)    History of colon polyps    HIV infection ([place])    Hyperlipidemia    Hypertension  Internal hemorrhoids    Osteopenia    Shingles    Sleep apnea    does not wear a c-pap   Ulcerative colitis ([facility])    Vitamin D deficiency     Social History: Social History   Socioeconomic History   Marital status: Divorced    Spouse name: Not on file   Number of children: 2   Years of education: Not on file   Highest education level: Not on file  Occupational History   Occupation: Financial planner    Comment: Disabled   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  Tobacco Use   Smoking status: Former    Types: Cigarettes    Quit date: 06/06/1987    Years since quitting: 33.6   Smokeless tobacco: Never   Tobacco comments:    quit 32 years ago  Vaping Use   Vaping Use: Never used  Substance and Sexual Activity    Alcohol use: Yes    Comment: every other day -social   Drug use: Yes    Types: Marijuana    Comment: occassional usage - last smoked 3 months ago   Sexual activity: Yes    Partners: Male    Comment: declined condoms  Other Topics Concern   Not on file  Social History Narrative   Current Social History 05/16/2020        Patient lives with house mate in a two level townhome with handrails inside and 1-2 outside steps without handrails       Patient's method of transportation is personal car.      The highest level of education was 2 years college.      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 Also, disabled from [place]       Identified important relationships are house mate, Cecile Hearing       Pets : 2 rescue cats from [place]       Interests / Fun: Going to gym, day trips ([place]), different restaurants       Current Stressors: Worry about son with PTSD (15 years service in [place] and [place])       Religious / Personal Beliefs: "A bit Spiritual."       L. [place], BSN, RN-BC       Social Determinants of Health   Financial Resource Strain: Not on file  Food Insecurity: Not on file  Transportation Needs: Not on file  Physical Activity: Not on file  Stress: Not on file  Social Connections: Not on file    Labs: Lab Results  Component Value Date   HIV1RNAQUANT Not Detected 09/17/2020   HIV1RNAQUANT Not Detected 07/16/2020   HIV1RNAQUANT Not Detected 05/10/2020   CD4TABS 962 09/17/2020   CD4TABS 1,261 04/09/2020   CD4TABS 1,097 08/18/2019    RPR and STI Lab Results  Component Value Date   LABRPR REACTIVE (A) 08/26/2020   LABRPR REACTIVE (A) 07/16/2020   LABRPR REACTIVE (A) 02/23/2020   LABRPR NON-REACTIVE 05/16/2019   LABRPR REACTIVE (A) 02/01/2019   RPRTITER 1:4 (H) 08/26/2020   RPRTITER 1:4 (H) 07/16/2020   RPRTITER 1:32 (H) 02/23/2020   RPRTITER 1:1 (H) 02/01/2019    STI Results GC CT  08/26/2020 Positive(A)  Negative  08/26/2020 Positive(A) Negative  08/26/2020 Negative Negative  02/01/2019 Negative Negative    Hepatitis B Lab Results  Component Value Date   HEPBSAB REACTIVE (A) 02/01/2019   HEPBSAG NON-REACTIVE 02/01/2019  HEPBCAB NON-REACTIVE 02/01/2019   Hepatitis C Lab Results  Component Value Date   HEPCAB NON-REACTIVE 02/01/2019   Hepatitis A Lab Results  Component Value Date   HAV REACTIVE (A) 02/01/2019   Lipids: Lab Results  Component Value Date   CHOL 222 (H) 02/23/2020   TRIG 358 (H) 02/23/2020   HDL 40 02/23/2020   CHOLHDL 5.6 (H) 02/23/2020   LDLCALC 131 (H) 02/23/2020    TARGET DATE:  The 29th of the month  Current HIV Regimen: Cabenuva   Assessment: [name] presents today for their maintenance Cabenuva injections. Initial/past injections were tolerated well without issues. No problems with systemic effects of injections. Will defer labs until his next visit with Marya Amsler in 2 months.  Administered cabotegravir 673m/3mL in left upper outer quadrant of the gluteal muscle. Administered rilpivirine 900 mg/33min the right upper outer quadrant of the gluteal muscle. Monitored patient for 10 minutes after injection. Injections were tolerated well without issue. Patient will follow up in 2 months for next injection.  Patient due for his COVID booster today which was administered.  Plan: - Cabenuva injections administered - Next injections scheduled for 2/28 with [place]  - Call with any issues or questions  AmAlfonse SprucePharmD, CPP Clinical Pharmacist Practitioner [facility]ious [facility]

## 2021-01-21 NOTE — Progress Notes (Signed)
° °  Covid-19 Vaccination Clinic  Name:  Robert Park    MRN: [id number] DOB: 1950-10-19  01/21/2021  [name] was observed post Covid-19 immunization for 15 minutes without incident. He was provided with Vaccine Information Sheet and instruction to access the V-Safe system.   [name] was instructed to call 911 with any severe reactions post vaccine: Difficulty breathing  Swelling of face and throat  A fast heartbeat  A bad rash all over body  Dizziness and weakness   Immunizations Administered     Name Date Dose VIS Date Route   Pfizer Covid-19 Vaccine Bivalent Booster 01/21/2021 11:03 AM 0.3 mL 09/11/2020 Intramuscular   Manufacturer: [place]   Lot: [id number]   [place]: [id number]       Donald Pore, PharmD Pharmacy Resident 01/21/2021, 11:05 AM

## 2021-01-22 ENCOUNTER — Ambulatory Visit (AMBULATORY_SURGERY_CENTER): Payer: Medicare Other | Admitting: Gastroenterology

## 2021-01-22 ENCOUNTER — Encounter: Payer: Self-pay | Admitting: Gastroenterology

## 2021-01-22 VITALS — BP 124/79 | HR 70 | Temp 97.7°F | Resp 22 | Ht 67.0 in | Wt 231.0 lb

## 2021-01-22 DIAGNOSIS — K5130 Ulcerative (chronic) rectosigmoiditis without complications: Secondary | ICD-10-CM | POA: Diagnosis not present

## 2021-01-22 MED ORDER — SODIUM CHLORIDE 0.9 % IV SOLN: 500.0000 mL | Freq: Once | INTRAVENOUS | Status: DC

## 2021-01-22 NOTE — Progress Notes (Signed)
History and Physical:  This patient presents for endoscopic testing for: Encounter Diagnosis  Name Primary?   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  Stable UC on Humira.  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[place])    Depression    Diverticulosis    Dyslipidemia    ED (erectile dysfunction)    GERD (gastroesophageal reflux disease)    History of colon polyps    HIV infection ([place])    Hyperlipidemia    Hypertension    Internal hemorrhoids    Osteopenia    Shingles    Sleep apnea    does not wear a c-pap   Ulcerative colitis ([place])    Vitamin D deficiency      Past Surgical History: Past Surgical History:  Procedure Laterality Date   CARPAL TUNNEL RELEASE Right    x 2   CARPAL TUNNEL RELEASE Left    x 2   CATARACT EXTRACTION, BILATERAL Bilateral 10/2017   COLONOSCOPY     HEMORRHOID SURGERY     MOUTH SURGERY     TONSILLECTOMY AND ADENOIDECTOMY     age 26   UPPER GASTROINTESTINAL ENDOSCOPY     VASECTOMY      Allergies: Allergies  Allergen Reactions   Elemental Sulfur    Prednisone    Statins     Muscle pains, GI symptoms   Sulfamethoxazole-Trimethoprim     Outpatient Meds: Current Outpatient Medications  Medication Sig Dispense Refill   Adalimumab (HUMIRA) 40 MG/0.4ML PSKT Inject into the skin.     albuterol (VENTOLIN HFA) 108 (90 Base) MCG/ACT inhaler Inhale 1 puff into the lungs every 6 (six) hours as needed for wheezing or shortness of breath. 8 g 0   cabotegravir & rilpivirine ER (CABENUVA) 600 & 900 MG/3ML injection INJECT 1 KIT INTO THE MUSCLE ONCE FOR 1 DOSE. 6 mL 6   cetirizine (ZYRTEC ALLERGY) 10 MG tablet Take 1 tablet (10 mg total) by mouth daily. 30 tablet 2   escitalopram  (LEXAPRO) 20 MG tablet Take 1 tablet (20 mg total) by mouth daily. 90 tablet 3   ezetimibe (ZETIA) 10 MG tablet Take 1 tablet (10 mg total) by mouth daily. 90 tablet 3   fluticasone (FLONASE) 50 MCG/ACT nasal spray Place 1 spray into both nostrils daily. 16 g 2   fluticasone (FLOVENT HFA) 110 MCG/ACT inhaler Inhale 2 puffs into the lungs 2 (two) times daily. 1 each 5   ibuprofen (ADVIL) 200 MG tablet Take 200 mg by mouth every 6 (six) hours as needed.     losartan (COZAAR) 50 MG tablet Take 1 tablet (50 mg total) by mouth daily. 90 tablet 3   montelukast (SINGULAIR) 10 MG tablet Take 1 tablet (10 mg total) by mouth at bedtime. 30 tablet 5   Multiple Vitamins-Minerals (CENTRUM SILVER 50+MEN) TABS Take by mouth. Three times per week     Olopatadine HCl 0.6 % SOLN Place 2 sprays into the nose daily. 30.5 g 5   PEG-KCl-NaCl-NaSulf-Na Asc-C (PLENVU) 140 g SOLR Take 140 g by mouth as directed. Manufacturer's coupon Universal coupon code:BIN: P2366821; GROUP: [id number]; PCN: [id number]; ID: [id number]; PAY NO MORE $50 1 each 0  tadalafil (CIALIS) 10 MG tablet Take 0.5 tablets (5 mg total) by mouth daily as needed for erectile dysfunction. 20 tablet 0   Current Facility-Administered Medications  Medication Dose Route Frequency Provider Last Rate Last Admin   0.9 %  sodium chloride infusion  500 mL Intravenous Once Doran Stabler, MD          ___________________________________________________________________ Objective   Exam:  BP 135/83    Pulse 90    Temp 97.7 F (36.5 C)    Ht 5' 7"  (1.702 m)    Wt 231 lb (104.8 kg)    SpO2 96%    BMI 36.18 kg/m   CV: RRR without murmur, S1/S2 Resp: clear to auscultation bilaterally, normal RR and effort noted GI: soft, no tenderness, with active bowel sounds.   Assessment: Encounter Diagnosis  Name Primary?   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  Risks were outlined as including, but not limited to, bleeding, infection, perforation, adverse medication reaction leading to cardiac or pulmonary decompensation, pancreatitis (if ERCP).  The limitation of incomplete mucosal visualization was also discussed.  No guarantees or warranties were given.    The patient is appropriate for an endoscopic procedure in the ambulatory setting.   - Wilfrid Lund, MD

## 2021-01-22 NOTE — Progress Notes (Signed)
Pt's states no medical or surgical changes since previsit or office visit. 

## 2021-01-22 NOTE — Progress Notes (Signed)
Called to room to assist during endoscopic procedure.  Patient ID and intended procedure confirmed with present staff. Received instructions for my participation in the procedure from the performing physician.  

## 2021-01-22 NOTE — Op Note (Signed)
[name] Procedure Date: 01/22/2021 9:15 AM MRN: [id number] Endoscopist: Mallie Mussel L. Loletha Carrow , MD Age: 71 Referring MD:  Date of Birth: 10/08/1950 Gender: Male Account #: [id number] Procedure:                Colonoscopy Indications:              High risk colon cancer surveillance: Ulcerative                            pancolitis of 8 (or more) years duration                           Excellent long term control on Humira. Medicines:                Monitored Anesthesia Care Procedure:                Pre-Anesthesia Assessment:                           - Prior to the procedure, a History and Physical                            was performed, and patient medications and                            allergies were reviewed. The patient's tolerance of                            previous anesthesia was also reviewed. The risks                            and benefits of the procedure and the sedation                            options and risks were discussed with the patient.                            All questions were answered, and informed consent                            was obtained. Prior Anticoagulants: The patient has                            taken no previous anticoagulant or antiplatelet                            agents. ASA Grade Assessment: II - A patient with                            mild systemic disease. After reviewing the risks                            and benefits, the patient was deemed in  satisfactory condition to undergo the procedure.                           After obtaining informed consent, the colonoscope                            was passed under direct vision. Throughout the                            procedure, the patient's blood pressure, pulse, and                            oxygen saturations were monitored continuously. The                            CF [id number] #[id number] was introduced  through the anus                            and advanced to the the cecum, identified by                            appendiceal orifice and ileocecal valve. The                            colonoscopy was extremely difficult due to a                            redundant colon, significant looping and the                            patient's body habitus. Successful completion of                            the procedure was aided by changing the patient's                            position, using manual pressure and straightening                            and shortening the scope to obtain bowel loop                            reduction. The patient tolerated the procedure                            well. The quality of the bowel preparation was good                            after lavage. The ileocecal valve, appendiceal                            orifice, and rectum were photographed. The bowel  preparation used was Plenvu. Scope In: 9:24:12 AM Scope Out: 9:59:57 AM Scope Withdrawal Time: 0 hours 17 minutes 21 seconds  Total Procedure Duration: 0 hours 35 minutes 45 seconds  Findings:                 The digital rectal exam findings include decreased                            sphincter tone.                           Normal mucosa was found in the entire colon. Four                            biopsies were taken every 10 cm with a cold forceps                            from the entire colon for dysplasia surveillance.                            These biopsy specimens were sent to Pathology. (8                            bottles). No raised or suspicious areas under WL or                            NBI inspection.                           The colon (entire examined portion) was redundant.                           Multiple diverticula were found in the left colon.                           Retroflexion in the rectum was not performed due to                             narrow anatomy and difficulty retaining air.                           The exam was otherwise without abnormality. Complications:            No immediate complications. Estimated Blood Loss:     Estimated blood loss was minimal. Impression:               - Decreased sphincter tone found on digital rectal                            exam.                           - Normal mucosa in the entire examined colon.                            Biopsied.                           -  Redundant colon.                           - Diverticulosis in the left colon.                           - The examination was otherwise normal. Recommendation:           - Patient has a contact number available for                            emergencies. The signs and symptoms of potential                            delayed complications were discussed with the                            patient. Return to normal activities tomorrow.                            Written discharge instructions were provided to the                            patient.                           - Resume previous diet.                           - Continue present medications.                           - Await pathology results.                           - Repeat colonoscopy in 2 years for surveillance                            (unless Bx results require otherwise). Britt Petroni L. Loletha Carrow, MD 01/22/2021 10:09:42 AM This report has been signed electronically.

## 2021-01-22 NOTE — Progress Notes (Signed)
A and O x3. Report to RN. Tolerated MAC anesthesia well.

## 2021-01-22 NOTE — Patient Instructions (Signed)
Thank you for letting us take care of your healthcare needs today. Please see handouts given to you on Diverticulosis.    YOU HAD AN ENDOSCOPIC PROCEDURE TODAY AT [facility]:   Refer to the procedure report that was given to you for any specific questions about what was found during the examination.  If the procedure report does not answer your questions, please call your gastroenterologist to clarify.  If you requested that your care partner not be given the details of your procedure findings, then the procedure report has been included in a sealed envelope for you to review at your convenience later.  YOU SHOULD EXPECT: Some feelings of bloating in the abdomen. Passage of more gas than usual.  Walking can help get rid of the air that was put into your GI tract during the procedure and reduce the bloating. If you had a lower endoscopy (such as a colonoscopy or flexible sigmoidoscopy) you may notice spotting of blood in your stool or on the toilet paper. If you underwent a bowel prep for your procedure, you may not have a normal bowel movement for a few days.  Please Note:  You might notice some irritation and congestion in your nose or some drainage.  This is from the oxygen used during your procedure.  There is no need for concern and it should clear up in a day or so.  SYMPTOMS TO REPORT IMMEDIATELY:  Following lower endoscopy (colonoscopy or flexible sigmoidoscopy):  Excessive amounts of blood in the stool  Significant tenderness or worsening of abdominal pains  Swelling of the abdomen that is new, acute  Fever of 100F or higher  For urgent or emergent issues, a gastroenterologist can be reached at any hour by calling [phone]. Do not use MyChart messaging for urgent concerns.    DIET:  We do recommend a small meal at first, but then you may proceed to your regular diet.  Drink plenty of fluids but you should avoid alcoholic beverages for 24 hours.  ACTIVITY:   You should plan to take it easy for the rest of today and you should NOT DRIVE or use heavy machinery until tomorrow (because of the sedation medicines used during the test).    FOLLOW UP: Our staff will call the number listed on your records 48-72 hours following your procedure to check on you and address any questions or concerns that you may have regarding the information given to you following your procedure. If we do not reach you, we will leave a message.  We will attempt to reach you two times.  During this call, we will ask if you have developed any symptoms of COVID 19. If you develop any symptoms (ie: fever, flu-like symptoms, shortness of breath, cough etc.) before then, please call [phone].  If you test positive for Covid 19 in the 2 weeks post procedure, please call and report this information to [place].    If any biopsies were taken you will be contacted by phone or by letter within the next 1-3 weeks.  Please call us at [phone] if you have not heard about the biopsies in 3 weeks.    SIGNATURES/CONFIDENTIALITY: You and/or your care partner have signed paperwork which will be entered into your electronic medical record.  These signatures attest to the fact that that the information above on your After Visit Summary has been reviewed and is understood.  Full responsibility of the confidentiality of this discharge information lies with you  and/or your care-partner.

## 2021-01-24 ENCOUNTER — Telehealth: Payer: Self-pay

## 2021-01-24 NOTE — Telephone Encounter (Signed)
°  Follow up Call-  Call back number 01/22/2021 06/21/2019  Post procedure Call Back phone  # [phone] ce;;  Permission to leave phone message Yes Yes  Some recent data might be hidden     Patient questions:  Do you have a fever, pain , or abdominal swelling? No. Pain Score  0 *  Have you tolerated food without any problems? Yes.    Have you been able to return to your normal activities? Yes.    Do you have any questions about your discharge instructions: Diet   No. Medications  No. Follow up visit  No.  Do you have questions or concerns about your Care? No.  Actions: * If pain score is 4 or above: No action needed, pain <4.  Have you developed a fever since your procedure? No  2.   Have you had an respiratory symptoms (SOB or cough) since your procedure? No  3.   Have you tested positive for COVID 19 since your procedure No  4.   Have you had any family members/close contacts diagnosed with the COVID 19 since your procedure?  No   If yes to any of these questions please route to Joylene John, RN and Joella Prince, RN

## 2021-01-24 NOTE — Telephone Encounter (Signed)
Attempted f/u call back. No answer, left VM.

## 2021-01-27 ENCOUNTER — Encounter: Payer: Self-pay | Admitting: Gastroenterology

## 2021-01-28 ENCOUNTER — Other Ambulatory Visit: Payer: Self-pay

## 2021-01-28 ENCOUNTER — Encounter: Payer: Self-pay | Admitting: Allergy & Immunology

## 2021-01-28 ENCOUNTER — Ambulatory Visit: Payer: Medicare Other | Admitting: Allergy & Immunology

## 2021-01-28 VITALS — BP 128/88 | HR 73 | Temp 98.4°F | Resp 18 | Ht 67.0 in | Wt 227.6 lb

## 2021-01-28 DIAGNOSIS — J302 Other seasonal allergic rhinitis: Secondary | ICD-10-CM

## 2021-01-28 DIAGNOSIS — J4530 Mild persistent asthma, uncomplicated: Secondary | ICD-10-CM

## 2021-01-28 DIAGNOSIS — J3089 Other allergic rhinitis: Secondary | ICD-10-CM | POA: Diagnosis not present

## 2021-01-28 MED ORDER — MONTELUKAST SODIUM 10 MG PO TABS: 10.0000 mg | ORAL_TABLET | Freq: Every day | ORAL | 5 refills | Status: DC

## 2021-01-28 MED ORDER — OLOPATADINE HCL 0.6 % NA SOLN
2.0000 | Freq: Every day | NASAL | 5 refills | Status: DC
Start: 2021-01-28 — End: 2022-01-22

## 2021-01-28 MED ORDER — FLUTICASONE PROPIONATE HFA 110 MCG/ACT IN AERO: 2.0000 | INHALATION_SPRAY | Freq: Two times a day (BID) | RESPIRATORY_TRACT | 5 refills | Status: DC

## 2021-01-28 NOTE — Progress Notes (Signed)
FOLLOW UP  Date of Service/Encounter:  01/28/21   Assessment:   Mild persistent asthma, uncomplicated - with much better control since starting the Flovent    Seasonal and perennial allergic rhinitis (grasses, ragweed, weeds, trees, cat, and dog)   Complicated past medical history including well-controlled HIV as well as ulcerative colitis  Plan/Recommendations:   1. Mild persistent asthma, uncomplicated - Lung testing looks amazing today!  - I think we are on the right track with the [place].  - Spacer use reviewed. - Daily controller medication(s): Flovent 113mg 2 puffs twice daily with spacer - Prior to physical activity: albuterol 2 puffs 10-15 minutes before physical activity. - Rescue medications: albuterol 4 puffs every 4-6 hours as needed - Changes during respiratory infections or worsening symptoms: Increase Flovent 1131m to 4 puffs twice daily for TWO WEEKS. - Asthma control goals:  * Full participation in all desired activities (may need albuterol before activity) * Albuterol use two time or less a week on average (not counting use with activity) * Cough interfering with sleep two time or less a month * Oral steroids no more than once a year * No hospitalizations  2. Chronic rhinitis (grasses, ragweed, weeds, trees, cat, and dog) - It seems that everything is well controlled. - Continue with: Zyrtec (cetirizine) 1064mablet 1-2 times daily and Flonase (fluticasone) two sprays per nostril daily - Continue with: Singulair (montelukast) 3m85mily and Patanase (olopatadine) two sprays per nostril 1-2 times daily as needed - Singulair can cause irritability and depression, so be aware of that and stop it if it happens, however, most people tolerate it without a problem - You can use an extra dose of the antihistamine, if needed, for breakthrough symptoms.  - Consider nasal saline rinses 1-2 times daily to remove allergens from the nasal cavities as well as help with  mucous clearance (this is especially helpful to do before the nasal sprays are given) - Consider allergy shots as a means of long-term control.  3. Return in about 6 months (around 07/28/2021).     Subjective:   Robert Park 70 y22. male presenting today for follow up of  Chief Complaint  Patient presents with   Asthma    No issues    Cough    Constant cough - takes singular and zyrtec     Robert Park a history of the following: Patient Active Problem List   Diagnosis Date Noted   COVID-19 virus infection 11/21/2020   Mild persistent asthma, uncomplicated 06/256/81/2751easonal and perennial allergic rhinitis 07/02/2020   Upper airway cough syndrome 06/25/2020   Allergic sinusitis 02/05/2020   Hypertension 08/07/2019   Prediabetes 08/07/2019   Hyperlipidemia 08/07/2019   GAD (generalized anxiety disorder) 08/07/2019   Localized osteoarthritis of left hand 08/07/2019   Colon polyps 06/06/2019   Human immunodeficiency virus (HIV) disease (HCC)[place]/04/2019   Screening for STDs (sexually transmitted diseases) 02/16/2019   Osteopenia 02/16/2019   Healthcare maintenance 02/16/2019   Ulcerative colitis without complications (HCC)[place]/070/01/7494History obtained from: chart review and patient.  Robert Park 70 y39. male presenting for a follow up visit.  I last talked to him in August 2022 for a televisit.  At this time, [place] worried about how his breathing was going to do over there.  We continued him on Flovent 2 puffs twice daily.  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  For his allergic rhinitis, he was doing well on Zyrtec 10 mg daily.  He remains on the montelukast as well.  Since last visit, he has done very well. He had an excellent trip to [place] and [place] last fall. He did have COVID19 in October 2022 when he was in [place]. He had body aches for 4-5 days and a little nausea and fatigue. Then he  started feeling better and the fever continued. Around the second weekend back, he woke up and he felt fine. He ended up spending an extra five days in [place] because of the diagnosis. He did not tell anyone that he was positive; instead they just cancelled the part of the trip where they were planning to go to [place]. He is sad to have missed it, but he is grateful that he was abel to stay in the hotel without any problems.   Asthma/Respiratory Symptom History: He remains on the Flovent two puffs BID. Robert Park's asthma has been well controlled. He has not required rescue medication, experienced nocturnal awakenings due to lower respiratory symptoms, nor have activities of daily living been limited. He has required no Emergency Department or Urgent Care visits for his asthma. He has required zero courses of systemic steroids for asthma exacerbations since the last visit. ACT score today is 25, indicating excellent asthma symptom control. He did not even get prednisone when he was diagnosed with COVID19.   Allergic Rhinitis Symptom History: He remains on the cetirizine as well as the montelukast. This is controlling his rhinitis symptoms very well. He has not required any antibiotics at all for any symptoms.  He feels generally quite good and does not feel that he Robert Park need any allergy shots at this point in time.   He has HIV and is on Cabenuva every two months. He has ulcerative colitis and used Humira every 2 weeks. He has been on this for 3 years and has not been in the hospital at all since then.   Otherwise, there have been no changes to his past medical history, surgical history, family history, or social history.    Review of Systems  Constitutional: Negative.  Negative for chills, fever, malaise/fatigue and weight loss.  HENT:  Negative for congestion, ear discharge, ear pain and sinus pain.   Eyes:  Negative for pain, discharge and redness.  Respiratory:  Negative for cough, sputum  production, shortness of breath and wheezing.   Cardiovascular: Negative.  Negative for chest pain and palpitations.  Gastrointestinal:  Negative for abdominal pain, constipation, diarrhea, heartburn, nausea and vomiting.  Skin: Negative.  Negative for itching and rash.  Neurological:  Negative for dizziness and headaches.  Endo/Heme/Allergies:  Positive for environmental allergies. Does not bruise/bleed easily.      Objective:   Blood pressure 128/88, pulse 73, temperature 98.4 F (36.9 C), resp. rate 18, height 5' 7"  (1.702 m), weight 227 lb 9.6 oz (103.2 kg), SpO2 95 %. Body mass index is 35.65 kg/m.   Physical Exam:  Physical Exam Vitals reviewed.  Constitutional:      Appearance: He is well-developed.     Comments: Very talkative today.  HENT:     Head: Normocephalic and atraumatic.     Right Ear: Tympanic membrane, ear canal and external ear normal. No drainage, swelling or tenderness. Tympanic membrane is not injected, scarred, erythematous, retracted or bulging.     Left Ear: Tympanic membrane, ear canal and external ear normal. No drainage, swelling or tenderness. Tympanic membrane  is not injected, scarred, erythematous, retracted or bulging.     Nose: No nasal deformity, septal deviation, mucosal edema or rhinorrhea.     Right Turbinates: Enlarged, swollen and pale.     Left Turbinates: Enlarged, swollen and pale.     Right Sinus: No maxillary sinus tenderness or frontal sinus tenderness.     Left Sinus: No maxillary sinus tenderness or frontal sinus tenderness.     Comments: No nasal polyps.    Mouth/Throat:     Mouth: Mucous membranes are not pale and not dry.     Pharynx: Uvula midline.  Eyes:     General: Lids are normal. Allergic shiner present.        Right eye: No discharge.        Left eye: No discharge.     Conjunctiva/sclera: Conjunctivae normal.     Right eye: Right conjunctiva is not injected. No chemosis.    Left eye: Left conjunctiva is not injected.  No chemosis.    Pupils: Pupils are equal, round, and reactive to light.  Cardiovascular:     Rate and Rhythm: Normal rate and regular rhythm.     Heart sounds: Normal heart sounds.  Pulmonary:     Effort: Pulmonary effort is normal. No tachypnea, accessory muscle usage or respiratory distress.     Breath sounds: Normal breath sounds. No wheezing, rhonchi or rales.     Comments: Moving air well in all lung fields.  No increased work of breathing. Chest:     Chest wall: No tenderness.  Lymphadenopathy:     Head:     Right side of head: No submandibular, tonsillar or occipital adenopathy.     Left side of head: No submandibular, tonsillar or occipital adenopathy.     Cervical: No cervical adenopathy.  Skin:    General: Skin is warm.     Capillary Refill: Capillary refill takes less than 2 seconds.     Coloration: Skin is not pale.     Findings: No abrasion, erythema, petechiae or rash. Rash is not papular, urticarial or vesicular.     Comments: No eczematous or urticarial lesions noted.  Neurological:     Mental Status: He is alert.  Psychiatric:        Behavior: Behavior is cooperative.     Diagnostic studies:   Spirometry: Normal FEV1, FVC, and FEV1/FVC ratio. There is no scooping suggestive of obstructive disease.   Allergy Studies: none        Salvatore Marvel, MD  [facility]

## 2021-01-28 NOTE — Patient Instructions (Signed)
1. Mild persistent asthma, uncomplicated - Lung testing looks amazing today!  - I think we are on the right track with the [place].  - Spacer use reviewed. - Daily controller medication(s): Flovent 159mg 2 puffs twice daily with spacer - Prior to physical activity: albuterol 2 puffs 10-15 minutes before physical activity. - Rescue medications: albuterol 4 puffs every 4-6 hours as needed - Changes during respiratory infections or worsening symptoms: Increase Flovent 111m to 4 puffs twice daily for TWO WEEKS. - Asthma control goals:  * Full participation in all desired activities (may need albuterol before activity) * Albuterol use two time or less a week on average (not counting use with activity) * Cough interfering with sleep two time or less a month * Oral steroids no more than once a year * No hospitalizations  2. Chronic rhinitis (grasses, ragweed, weeds, trees, cat, and dog) - It seems that everything is well controlled. - Continue with: Zyrtec (cetirizine) 1055mablet 1-2 times daily and Flonase (fluticasone) two sprays per nostril daily - Continue with: Singulair (montelukast) 24m68mily and Patanase (olopatadine) two sprays per nostril 1-2 times daily as needed - Singulair can cause irritability and depression, so be aware of that and stop it if it happens, however, most people tolerate it without a problem - You can use an extra dose of the antihistamine, if needed, for breakthrough symptoms.  - Consider nasal saline rinses 1-2 times daily to remove allergens from the nasal cavities as well as help with mucous clearance (this is especially helpful to do before the nasal sprays are given) - Consider allergy shots as a means of long-term control.  3. Return in about 6 months (around 07/28/2021).    Please inform us o[place]any Emergency Department visits, hospitalizations, or changes in symptoms. Call us bKoreaore going to the ED for breathing or allergy symptoms since we might be able to  fit you in for a sick visit. Feel free to contact us aKoreatime with any questions, problems, or concerns.  It was a pleasure to see you again today!  Websites that have reliable patient information: 1. American Academy of Asthma, Allergy, and Immunology: www.aaaai.org 2. Food Allergy Research and Education (FARE): foodallergy.org 3. Mothers of Asthmatics: http://www.asthmacommunitynetwork.org 4. American College of Allergy, Asthma, and Immunology: www.acaai.org   COVID-19 Vaccine Information can be found at: [email or web address] questions related to vaccine distribution or appointments, please email [email or web address] or call [phone] realize that you might be concerned about having an allergic reaction to the COVID19 vaccines. To help with that concern, WE ARE OFFERING THE COVID19 VACCINES IN OUR OFFICE! Ask the front desk for dates!     Like us [facility] Instagram for our latest updates!      A healthy democracy works best when ALL [employer] [place] [employer]ur[employer]ate! Make sure you are registered to vote! If you have moved or changed any of your contact information, you will need to get this updated before voting!  In some cases, you MAY be able to register to vote online: [email or web address]

## 2021-02-05 ENCOUNTER — Other Ambulatory Visit: Payer: Self-pay

## 2021-02-05 DIAGNOSIS — R059 Cough, unspecified: Secondary | ICD-10-CM

## 2021-02-06 MED ORDER — ALBUTEROL SULFATE HFA 108 (90 BASE) MCG/ACT IN AERS: 1.0000 | INHALATION_SPRAY | Freq: Four times a day (QID) | RESPIRATORY_TRACT | 2 refills | Status: DC | PRN

## 2021-02-24 ENCOUNTER — Other Ambulatory Visit (HOSPITAL_COMMUNITY): Payer: Self-pay

## 2021-02-25 ENCOUNTER — Other Ambulatory Visit (HOSPITAL_COMMUNITY): Payer: Self-pay

## 2021-02-25 MED FILL — Cabotegravir 600 MG/3ML & Rilpivirine 900 MG/3ML IM Susp ER: INTRAMUSCULAR | 30 days supply | Qty: 6 | Fill #5 | Status: AC

## 2021-02-26 ENCOUNTER — Telehealth: Payer: Self-pay

## 2021-02-26 NOTE — Telephone Encounter (Signed)
[facility] Patient Advocate Encounter  Patient's medication Kern Reap) have been couriered to [facility] from [facility] and will be administered on patient next office visit on 03/11/21.  [name] , [facility] Patient [facility] Phone: [phone] Fax:  [phone]

## 2021-03-11 ENCOUNTER — Other Ambulatory Visit: Payer: Self-pay

## 2021-03-11 ENCOUNTER — Encounter: Payer: Self-pay | Admitting: Family

## 2021-03-11 ENCOUNTER — Ambulatory Visit (INDEPENDENT_AMBULATORY_CARE_PROVIDER_SITE_OTHER): Payer: Medicare Other | Admitting: Family

## 2021-03-11 ENCOUNTER — Encounter: Payer: Medicare Other | Admitting: Family

## 2021-03-11 VITALS — BP 152/88 | HR 85 | Temp 97.9°F | Resp 16 | Wt 228.0 lb

## 2021-03-11 DIAGNOSIS — Z79899 Other long term (current) drug therapy: Secondary | ICD-10-CM | POA: Diagnosis not present

## 2021-03-11 DIAGNOSIS — B20 Human immunodeficiency virus [HIV] disease: Secondary | ICD-10-CM

## 2021-03-11 DIAGNOSIS — Z0000 Encounter for general adult medical examination without abnormal findings: Secondary | ICD-10-CM

## 2021-03-11 DIAGNOSIS — Z113 Encounter for screening for infections with a predominantly sexual mode of transmission: Secondary | ICD-10-CM | POA: Diagnosis not present

## 2021-03-11 MED ORDER — CABOTEGRAVIR & RILPIVIRINE ER 600 & 900 MG/3ML IM SUER
1.0000 | Freq: Once | INTRAMUSCULAR | Status: AC
  Administered 2021-03-11: 1 via INTRAMUSCULAR

## 2021-03-11 NOTE — Addendum Note (Signed)
Addended by: Tomi Bamberger on: 03/11/2021 11:13 AM   Modules accepted: Orders

## 2021-03-11 NOTE — Progress Notes (Signed)
Brief Narrative   Patient ID: Jarris Kortz, male    DOB: August 26, 1950, 71 y.o.   MRN: [id number]  Mr. Buonocore is a 71 year old Caucasian gentleman diagnosed with HIV disease in the 2010 with risk factor of MSM.  Previous history of CMV colitis in 2010-2011.  Initial CD4 count of 450 with viral load of 21,000.  Entered care and CDC stage II.  [id number] negative. Previous medications include Atripla and Genovya.  Currently on Cabenuva.   Subjective:    Chief Complaint  Patient presents with   Follow-up    B20 - pt requesting refill on cialis.     HPI:  Hayward Rylander is a 71 y.o. male with HIV disease last seen by Alfonse Spruce, PharmD, CPP on 01/21/2021 with good tolerance to [place]. Here today for routine follow up and next q 2 month injection.  Mr. Maselli continues to receive his Kern Reap as prescribed with some soreness following injection and no other adverse side effects.  Overall feeling well today with no new concerns/complaints. Denies fevers, chills, night sweats, headaches, changes in vision, neck pain/stiffness, nausea, diarrhea, vomiting, lesions or rashes.  Mr. Godsey denies any feelings of being down, depressed, or hopeless recently.  No current recreational illicit drug use, tobacco use, or alcohol consumption.  Condoms offered.  Healthcare maintenance due includes Pneumovax and second dose of Shingrix.   Allergies  Allergen Reactions   Elemental Sulfur    Prednisone    Statins     Muscle pains, GI symptoms   Sulfamethoxazole-Trimethoprim       Outpatient Medications Prior to Visit  Medication Sig Dispense Refill   Adalimumab (HUMIRA) 40 MG/0.4ML PSKT Inject into the skin.     albuterol (VENTOLIN HFA) 108 (90 Base) MCG/ACT inhaler Inhale 1 puff into the lungs every 6 (six) hours as needed for wheezing or shortness of breath. 8 g 2   aspirin 81 MG EC tablet Take 1 tablet by mouth daily.     cabotegravir & rilpivirine ER (CABENUVA) 600 & 900 MG/3ML injection INJECT 1  KIT INTO THE MUSCLE ONCE FOR 1 DOSE. 6 mL 6   escitalopram (LEXAPRO) 20 MG tablet Take 1 tablet (20 mg total) by mouth daily. 90 tablet 3   ezetimibe (ZETIA) 10 MG tablet Take 1 tablet (10 mg total) by mouth daily. 90 tablet 3   fluticasone (FLOVENT HFA) 110 MCG/ACT inhaler Inhale 2 puffs into the lungs 2 (two) times daily. Flovent 121mg to 4 puffs twice daily for TWO WEEKS 1 each 5   ibuprofen (ADVIL) 200 MG tablet Take 200 mg by mouth every 6 (six) hours as needed.     losartan (COZAAR) 50 MG tablet Take 1 tablet (50 mg total) by mouth daily. 90 tablet 3   montelukast (SINGULAIR) 10 MG tablet Take 1 tablet (10 mg total) by mouth at bedtime. 30 tablet 5   Multiple Vitamins-Minerals (CENTRUM SILVER 50+MEN) TABS Take by mouth. Three times per week     Olopatadine HCl 0.6 % SOLN Place 2 sprays into the nose daily. two sprays per nostril 1-2 times daily as needed 30.5 g 5   tadalafil (CIALIS) 10 MG tablet Take 0.5 tablets (5 mg total) by mouth daily as needed for erectile dysfunction. 20 tablet 0   Vitamin D, Ergocalciferol, (DRISDOL) 1.25 MG (50000 UNIT) CAPS capsule Take 50,000 Units by mouth once a week.     cetirizine (ZYRTEC ALLERGY) 10 MG tablet Take 1 tablet (10 mg total) by mouth daily. 30 tablet  2   No facility-administered medications prior to visit.     Past Medical History:  Diagnosis Date   Allergy    Anxiety    Arthritis    Asthma    Cataract    Bilateral eyes   CMV colitis ([place])    Depression    Diverticulosis    Dyslipidemia    ED (erectile dysfunction)    GERD (gastroesophageal reflux disease)    History of colon polyps    HIV infection ([place])    Hyperlipidemia    Hypertension    Internal hemorrhoids    Osteopenia    Shingles    Sleep apnea    does not wear a c-pap   Ulcerative colitis ([place])    Vitamin D deficiency      Past Surgical History:  Procedure Laterality Date   CARPAL TUNNEL RELEASE Right    x 2   CARPAL TUNNEL RELEASE Left    x 2   CATARACT  EXTRACTION, BILATERAL Bilateral 10/2017   COLONOSCOPY     HEMORRHOID SURGERY     MOUTH SURGERY     TONSILLECTOMY AND ADENOIDECTOMY     age 86   UPPER GASTROINTESTINAL ENDOSCOPY     VASECTOMY        Review of Systems  Constitutional:  Negative for appetite change, chills, fatigue, fever and unexpected weight change.  Eyes:  Negative for visual disturbance.  Respiratory:  Negative for cough, chest tightness, shortness of breath and wheezing.   Cardiovascular:  Negative for chest pain and leg swelling.  Gastrointestinal:  Negative for abdominal pain, constipation, diarrhea, nausea and vomiting.  Genitourinary:  Negative for dysuria, flank pain, frequency, genital sores, hematuria and urgency.  Skin:  Negative for rash.  Allergic/Immunologic: Negative for immunocompromised state.  Neurological:  Negative for dizziness and headaches.     Objective:    BP (!) 152/88    Pulse 85    Temp 97.9 F (36.6 C) (Temporal)    Resp 16    Wt 228 lb (103.4 kg)    SpO2 95%    BMI 35.71 kg/m  Nursing note and vital signs reviewed.  Physical Exam Constitutional:      General: He is not in acute distress.    Appearance: He is well-developed.  Eyes:     Conjunctiva/sclera: Conjunctivae normal.  Cardiovascular:     Rate and Rhythm: Normal rate and regular rhythm.     Heart sounds: Normal heart sounds. No murmur heard.   No friction rub. No gallop.  Pulmonary:     Effort: Pulmonary effort is normal. No respiratory distress.     Breath sounds: Normal breath sounds. No wheezing or rales.  Chest:     Chest wall: No tenderness.  Abdominal:     General: Bowel sounds are normal.     Palpations: Abdomen is soft.     Tenderness: There is no abdominal tenderness.  Musculoskeletal:     Cervical back: Neck supple.  Lymphadenopathy:     Cervical: No cervical adenopathy.  Skin:    General: Skin is warm and dry.     Findings: No rash.  Neurological:     Mental Status: He is alert and oriented to  person, place, and time.  Psychiatric:        Behavior: Behavior normal.        Thought Content: Thought content normal.        Judgment: Judgment normal.     Depression screen [facility] 2/9 03/11/2021 10/21/2020  09/17/2020 06/25/2020 05/16/2020  Decreased Interest 0 0 0 0 0  Down, Depressed, Hopeless 0 0 0 0 0  PHQ - 2 Score 0 0 0 0 0  Altered sleeping - - - 1 -  Tired, decreased energy - - - 1 -  Change in appetite - - - 0 -  Feeling bad or failure about yourself  - - - 0 -  Trouble concentrating - - - 0 -  Moving slowly or fidgety/restless - - - 0 -  Suicidal thoughts - - - 0 -  PHQ-9 Score - - - 2 -  Difficult doing work/chores - - - Not difficult at all -       Assessment & Plan:    Patient Active Problem List   Diagnosis Date Noted   COVID-19 virus infection 11/21/2020   Mild persistent asthma, uncomplicated 96/04/5407   Seasonal and perennial allergic rhinitis 07/02/2020   Upper airway cough syndrome 06/25/2020   Allergic sinusitis 02/05/2020   Hypertension 08/07/2019   Prediabetes 08/07/2019   Hyperlipidemia 08/07/2019   GAD (generalized anxiety disorder) 08/07/2019   Localized osteoarthritis of left hand 08/07/2019   Colon polyps 06/06/2019   Human immunodeficiency virus (HIV) disease ([place]) 02/16/2019   Screening for STDs (sexually transmitted diseases) 02/16/2019   Osteopenia 02/16/2019   Healthcare maintenance 02/16/2019   Ulcerative colitis without complications ([place]) 81/19/1478     Problem List Items Addressed This Visit       Other   Human immunodeficiency virus (HIV) disease ([place]) (Chronic)    Mr. Pinn continues to have well-controlled virus with good adherence and tolerance to his every 2 monthly Cabenuva.  No signs/symptoms of opportunistic infection.  We reviewed lab work and discussed plan of care.  Check blood work today.  Every 2 monthly injection of Cabenuva provided without complication.  Plan for follow-up in 2 months or sooner if needed with lab work  on the same day.      Relevant Orders   HIV-1 RNA quant-no reflex-bld   T-helper cells (CD4) count (not at [facility])   Comprehensive metabolic panel   Screening for STDs (sexually transmitted diseases) (Chronic)   Relevant Orders   RPR   Healthcare maintenance (Chronic)    Discussed importance of safe sexual practices and condom use.  Condoms offered. Pneumovax updated. Encourage routine dental care and can refer to [facility] clinic if necessary.      Other Visit Diagnoses     HIV disease ([place])    -  Primary   Pharmacologic therapy       Relevant Orders   Lipid panel        I am having Hoy Register "Bill" maintain his Humira, Centrum Silver 50+Men, ibuprofen, cabotegravir & rilpivirine ER, cetirizine, escitalopram, ezetimibe, losartan, tadalafil, Olopatadine HCl, montelukast, fluticasone, albuterol, Vitamin D (Ergocalciferol), and aspirin.   Follow-up: Return in about 2 months (around 05/09/2021), or if symptoms worsen or fail to improve.   Terri Piedra, MSN, FNP-C Nurse Practitioner [facility] [place] number: [phone]

## 2021-03-11 NOTE — Patient Instructions (Signed)
Nice to see you.  We will check your lab work today.  Plan for follow up in 2 months or sooner if needed with lab work on the same day.  Have a great day and stay safe!

## 2021-03-11 NOTE — Assessment & Plan Note (Signed)
·   Discussed importance of safe sexual practices and condom use.  Condoms offered.  Pneumovax updated.  Encourage routine dental care and can refer to [facility] clinic if necessary.

## 2021-03-11 NOTE — Assessment & Plan Note (Signed)
Robert Park continues to have well-controlled virus with good adherence and tolerance to his every 2 monthly Cabenuva.  No signs/symptoms of opportunistic infection.  We reviewed lab work and discussed plan of care.  Check blood work today.  Every 2 monthly injection of Cabenuva provided without complication.  Plan for follow-up in 2 months or sooner if needed with lab work on the same day.

## 2021-03-12 ENCOUNTER — Other Ambulatory Visit: Payer: Self-pay | Admitting: Student in an Organized Health Care Education/Training Program

## 2021-03-12 MED ORDER — TADALAFIL 10 MG PO TABS: 5.0000 mg | ORAL_TABLET | Freq: Every day | ORAL | 0 refills | Status: DC | PRN

## 2021-03-13 LAB — COMPREHENSIVE METABOLIC PANEL
AG Ratio: 1.9 (calc) (ref 1.0–2.5)
ALT: 30 U/L (ref 9–46)
AST: 20 U/L (ref 10–35)
Albumin: 4.5 g/dL (ref 3.6–5.1)
Alkaline phosphatase (APISO): 71 U/L (ref 35–144)
BUN: 10 mg/dL (ref 7–25)
CO2: 29 mmol/L (ref 20–32)
Calcium: 10.1 mg/dL (ref 8.6–10.3)
Chloride: 99 mmol/L (ref 98–110)
Creat: 0.86 mg/dL (ref 0.70–1.35)
Globulin: 2.4 g/dL (calc) (ref 1.9–3.7)
Glucose, Bld: 134 mg/dL — ABNORMAL HIGH (ref 65–99)
Potassium: 4.2 mmol/L (ref 3.5–5.3)
Sodium: 137 mmol/L (ref 135–146)
Total Bilirubin: 0.8 mg/dL (ref 0.2–1.2)
Total Protein: 6.9 g/dL (ref 6.1–8.1)

## 2021-03-13 LAB — RPR TITER: RPR Titer: 1:2 {titer} — ABNORMAL HIGH

## 2021-03-13 LAB — LIPID PANEL
Cholesterol: 261 mg/dL — ABNORMAL HIGH (ref <200)
HDL: 49 mg/dL (ref >=40)
LDL Cholesterol (Calc): 155 mg/dL (calc) — ABNORMAL HIGH
Non-HDL Cholesterol (Calc): 212 mg/dL (calc) — ABNORMAL HIGH (ref <130)
Total CHOL/HDL Ratio: 5.3 (calc) — ABNORMAL HIGH (ref <5.0)
Triglycerides: 370 mg/dL — ABNORMAL HIGH (ref <150)

## 2021-03-13 LAB — HIV-1 RNA QUANT-NO REFLEX-BLD
HIV 1 RNA Quant: NOT DETECTED Copies/mL
HIV-1 RNA Quant, Log: NOT DETECTED Log cps/mL

## 2021-03-13 LAB — T-HELPER CELLS (CD4) COUNT (NOT AT ARMC)
Absolute CD4: 1142 cells/uL (ref 490–1740)
CD4 T Helper %: 37 % (ref 30–61)
Total lymphocyte count: 3120 cells/uL (ref 850–3900)

## 2021-03-13 LAB — RPR: RPR Ser Ql: REACTIVE — AB

## 2021-03-13 LAB — FLUORESCENT TREPONEMAL AB(FTA)-IGG-BLD: Fluorescent Treponemal ABS: REACTIVE — AB

## 2021-03-17 MED ORDER — TADALAFIL 10 MG PO TABS: 5.0000 mg | ORAL_TABLET | Freq: Every day | ORAL | 0 refills | Status: DC | PRN

## 2021-03-24 ENCOUNTER — Other Ambulatory Visit: Payer: Self-pay

## 2021-03-24 DIAGNOSIS — J309 Allergic rhinitis, unspecified: Secondary | ICD-10-CM

## 2021-03-24 MED ORDER — CETIRIZINE HCL 10 MG PO TABS: 10.0000 mg | ORAL_TABLET | Freq: Every day | ORAL | 2 refills | Status: DC

## 2021-04-17 ENCOUNTER — Other Ambulatory Visit (HOSPITAL_COMMUNITY): Payer: Self-pay

## 2021-04-21 ENCOUNTER — Other Ambulatory Visit: Payer: Self-pay | Admitting: Family

## 2021-04-21 MED ORDER — TADALAFIL 10 MG PO TABS: 5.0000 mg | ORAL_TABLET | Freq: Every day | ORAL | 2 refills | Status: DC | PRN

## 2021-04-25 ENCOUNTER — Other Ambulatory Visit: Payer: Self-pay | Admitting: Pharmacist

## 2021-04-25 ENCOUNTER — Other Ambulatory Visit (HOSPITAL_COMMUNITY): Payer: Self-pay

## 2021-04-25 DIAGNOSIS — B20 Human immunodeficiency virus [HIV] disease: Secondary | ICD-10-CM

## 2021-04-25 MED ORDER — CABOTEGRAVIR & RILPIVIRINE ER 600 & 900 MG/3ML IM SUER
1.0000 | INTRAMUSCULAR | 5 refills | Status: DC
Start: 2021-04-25 — End: 2022-04-24
  Filled 2021-04-25: qty 6, 60d supply, fill #0
  Filled 2021-06-30: qty 6, 60d supply, fill #1
  Filled 2021-08-21: qty 6, 60d supply, fill #2
  Filled 2021-10-31: qty 6, 60d supply, fill #3
  Filled 2021-12-30 (×2): qty 6, 60d supply, fill #4
  Filled 2022-02-20: qty 6, 60d supply, fill #5

## 2021-04-26 ENCOUNTER — Other Ambulatory Visit: Payer: Self-pay | Admitting: Family

## 2021-04-28 ENCOUNTER — Other Ambulatory Visit (HOSPITAL_COMMUNITY): Payer: Self-pay

## 2021-04-29 ENCOUNTER — Telehealth: Payer: Self-pay

## 2021-04-29 NOTE — Telephone Encounter (Signed)
[facility] Patient Advocate Encounter ? ?Patient's medication Kern Reap) have been couriered to [facility] from [facility] and will be administered on the patient next office visit on 05/08/21. ? ?[name] , CPhT ?Specialty Pharmacy Patient Advocate ?[place] [facility] ?Phone: [phone] ?Fax:  [phone]  ?

## 2021-05-08 ENCOUNTER — Other Ambulatory Visit: Payer: Self-pay

## 2021-05-08 ENCOUNTER — Ambulatory Visit (INDEPENDENT_AMBULATORY_CARE_PROVIDER_SITE_OTHER): Payer: Medicare Other | Admitting: Pharmacist

## 2021-05-08 ENCOUNTER — Other Ambulatory Visit (HOSPITAL_COMMUNITY)
Admission: RE | Admit: 2021-05-08 | Discharge: 2021-05-08 | Disposition: A | Discharge status: Home / Self Care | Payer: Medicare Other | Source: Ambulatory Visit | Attending: Family | Admitting: Family

## 2021-05-08 DIAGNOSIS — Z113 Encounter for screening for infections with a predominantly sexual mode of transmission: Secondary | ICD-10-CM | POA: Insufficient documentation

## 2021-05-08 DIAGNOSIS — B20 Human immunodeficiency virus [HIV] disease: Secondary | ICD-10-CM | POA: Diagnosis not present

## 2021-05-08 MED ORDER — CABOTEGRAVIR & RILPIVIRINE ER 600 & 900 MG/3ML IM SUER
1.0000 | Freq: Once | INTRAMUSCULAR | Status: AC
  Administered 2021-05-08: 1 via INTRAMUSCULAR

## 2021-05-08 NOTE — Progress Notes (Signed)
? ?HPI: Robert Park is a 71 y.o. male who presents to the [facility] for [place] administration. ? ?Patient Active Problem List  ? Diagnosis Date Noted  ? COVID-19 virus infection 11/21/2020  ? Mild persistent asthma, uncomplicated 35/00/9381  ? Seasonal and perennial allergic rhinitis 07/02/2020  ? Upper airway cough syndrome 06/25/2020  ? Allergic sinusitis 02/05/2020  ? Hypertension 08/07/2019  ? Prediabetes 08/07/2019  ? Hyperlipidemia 08/07/2019  ? GAD (generalized anxiety disorder) 08/07/2019  ? Localized osteoarthritis of left hand 08/07/2019  ? Colon polyps 06/06/2019  ? Human immunodeficiency virus (HIV) disease ([place]) 02/16/2019  ? Screening for STDs (sexually transmitted diseases) 02/16/2019  ? Osteopenia 02/16/2019  ? Healthcare maintenance 02/16/2019  ? Ulcerative colitis without complications ([place]) 82/99/3716  ? ? ?Patient's Medications  ?New Prescriptions  ? No medications on file  ?Previous Medications  ? ADALIMUMAB (HUMIRA) 40 MG/0.4ML PSKT    Inject into the skin.  ? ALBUTEROL (VENTOLIN HFA) 108 (90 BASE) MCG/ACT INHALER    Inhale 1 puff into the lungs every 6 (six) hours as needed for wheezing or shortness of breath.  ? ASPIRIN 81 MG EC TABLET    Take 1 tablet by mouth daily.  ? CABOTEGRAVIR & RILPIVIRINE ER (CABENUVA) 600 & 900 MG/3ML INJECTION    Inject 1 kit into the muscle every 2 (two) months.  ? CETIRIZINE (ZYRTEC ALLERGY) 10 MG TABLET    Take 1 tablet (10 mg total) by mouth daily.  ? ESCITALOPRAM (LEXAPRO) 20 MG TABLET    Take 1 tablet (20 mg total) by mouth daily.  ? EZETIMIBE (ZETIA) 10 MG TABLET    Take 1 tablet (10 mg total) by mouth daily.  ? FLUTICASONE (FLOVENT HFA) 110 MCG/ACT INHALER    Inhale 2 puffs into the lungs 2 (two) times daily. Flovent 138mg to 4 puffs twice daily for TWO WEEKS  ? IBUPROFEN (ADVIL) 200 MG TABLET    Take 200 mg by mouth every 6 (six) hours as needed.  ? LOSARTAN (COZAAR) 50 MG TABLET    Take 1 tablet (50 mg total) by mouth daily.  ?  MONTELUKAST (SINGULAIR) 10 MG TABLET    Take 1 tablet (10 mg total) by mouth at bedtime.  ? MULTIPLE VITAMINS-MINERALS (CENTRUM SILVER 50+MEN) TABS    Take by mouth. Three times per week  ? OLOPATADINE HCL 0.6 % SOLN    Place 2 sprays into the nose daily. two sprays per nostril 1-2 times daily as needed  ? TADALAFIL (CIALIS) 10 MG TABLET    Take 0.5 tablets (5 mg total) by mouth daily as needed for erectile dysfunction.  ? VITAMIN D, ERGOCALCIFEROL, (DRISDOL) 1.25 MG (50000 UNIT) CAPS CAPSULE    TAKE 1 CAPSULE BY MOUTH EVERY 7 DAYS  ?Modified Medications  ? No medications on file  ?Discontinued Medications  ? No medications on file  ? ? ?Allergies: ?Allergies  ?Allergen Reactions  ? Elemental Sulfur   ? Prednisone   ? Statins   ?  Muscle pains, GI symptoms  ? Sulfamethoxazole-Trimethoprim   ? ? ?Past Medical History: ?Past Medical History:  ?Diagnosis Date  ? Allergy   ? Anxiety   ? Arthritis   ? Asthma   ? Cataract   ? Bilateral eyes  ? CMV colitis ([place]   ? Depression   ? Diverticulosis   ? Dyslipidemia   ? ED (erectile dysfunction)   ? GERD (gastroesophageal reflux disease)   ? History of colon polyps   ? HIV  infection ([place])   ? Hyperlipidemia   ? Hypertension   ? Internal hemorrhoids   ? Osteopenia   ? Shingles   ? Sleep apnea   ? does not wear a c-pap  ? Ulcerative colitis ([place])   ? Vitamin D deficiency   ? ? ?Social History: ?Social History  ? ?Socioeconomic History  ? Marital status: Divorced  ?  Spouse name: Not on file  ? Number of children: 2  ? Years of education: Not on file  ? Highest education level: Not on file  ?Occupational History  ? Occupation: Financial planner  ?  Comment: Disabled  ? Occupation: Courier  ?  Employer: [employer]  ?  Comment: PT  ?Tobacco Use  ? Smoking status: Former  ?  Types: Cigarettes  ?  Quit date: 06/06/1987  ?  Years since quitting: 33.9  ? Smokeless tobacco: Never  ? Tobacco comments:  ?  quit 32 years ago  ?Vaping Use  ? Vaping Use: Never used  ?Substance and Sexual  Activity  ? Alcohol use: Yes  ?  Comment: every other day -social  ? Drug use: Yes  ?  Types: Marijuana  ?  Comment: occassional usage - last smoked 3 months ago  ? Sexual activity: Yes  ?  Partners: Male  ?  Comment: declined condoms  ?Other Topics Concern  ? Not on file  ?Social History Narrative  ? Current Social History 05/16/2020    ?   ? Patient lives with house mate in a two level townhome with handrails inside and 1-2 outside steps without handrails   ?   ? Patient's method of transportation is personal car.  ?   ? The highest level of education was 2 years college.  ?   ? 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 Also, disabled from [place]   ?   ? Identified important relationships are house mate, Cecile Hearing   ?   ? Pets : 2 rescue cats from [place]  ?    ? Interests / Fun: Going to gym, day trips [place]), different restaurants   ?   ? Current Stressors: Worry about son with PTSD (15 years service in [place] and [place])   ?   ? Religious / Personal Beliefs: "A bit Spiritual."   ?   ? L. [place], BSN, RN-BC   ?   ? ?Social Determinants of Health  ? ?Financial Resource Strain: Not on file  ?Food Insecurity: Not on file  ?Transportation Needs: Not on file  ?Physical Activity: Not on file  ?Stress: Not on file  ?Social Connections: Not on file  ? ? ?Labs: ?Lab Results  ?Component Value Date  ? HIV1RNAQUANT Not Detected 03/11/2021  ? HIV1RNAQUANT Not Detected 09/17/2020  ? HIV1RNAQUANT Not Detected 07/16/2020  ? CD4TABS 962 09/17/2020  ? CD4TABS 1,261 04/09/2020  ? CD4TABS 1,097 08/18/2019  ? ? ?RPR and STI ?Lab Results  ?Component Value Date  ? LABRPR REACTIVE (A) 03/11/2021  ? LABRPR REACTIVE (A) 08/26/2020  ? LABRPR REACTIVE (A) 07/16/2020  ? LABRPR REACTIVE (A) 02/23/2020  ? LABRPR NON-REACTIVE 05/16/2019  ? RPRTITER 1:2 (H) 03/11/2021  ? RPRTITER 1:4 (H) 08/26/2020  ? RPRTITER 1:4 (H) 07/16/2020  ? RPRTITER 1:32 (H) 02/23/2020  ? RPRTITER 1:1 (H) 02/01/2019  ? ? ?STI  Results GC CT  ?08/26/2020 ? 4:05 PM Negative    ? Positive    ? Positive   Negative    ?  Negative    ? Negative    ?02/01/2019 ? 2:30 PM Negative   Negative    ? ? ?Hepatitis B ?Lab Results  ?Component Value Date  ? HEPBSAB REACTIVE (A) 02/01/2019  ? HEPBSAG NON-REACTIVE 02/01/2019  ? HEPBCAB NON-REACTIVE 02/01/2019  ? ?Hepatitis C ?Lab Results  ?Component Value Date  ? HEPCAB NON-REACTIVE 02/01/2019  ? ?Hepatitis A ?Lab Results  ?Component Value Date  ? HAV REACTIVE (A) 02/01/2019  ? ?Lipids: ?Lab Results  ?Component Value Date  ? CHOL 261 (H) 03/11/2021  ? TRIG 370 (H) 03/11/2021  ? HDL 49 03/11/2021  ? CHOLHDL 5.3 (H) 03/11/2021  ? LDLCALC 155 (H) 03/11/2021  ? ? ?TARGET DATE: ?29th of the month  ? ?Assessment: ?Robert Park presents today for their maintenance Cabenuva injections. Past injections were tolerated well without issues. No problems with systemic side effects of injections. Patient says he had very mild soreness for a couple of hours after the injection. Patient would like STI testing for [facility] and CT today. Will get urine/rectal/pharyngeal GC/CT swabs for cytology today. Will check HIV RNA at next visit.  ? ?Administered cabotegravir 660m/3mL in left upper outer quadrant of the gluteal muscle. Administered rilpivirine 900 mg/332min the right upper outer quadrant of the gluteal muscle. Injections were tolerated well. Patient will follow up in 2 months for next set of injections. ? ?Plan: ?- Cabenuva injections administered ?-Urine/rectal/pharyngeal GC/CT swabs for cytology today ?- Next injections scheduled for 6/29 with AmEstill Bambergand 8/24 with Cassie ?- Call with any issues or questions ? ?VaMarlowe AltPharmD Candidate ?05/08/2021 2:21 PM ? ?

## 2021-05-09 LAB — CYTOLOGY, (ORAL, ANAL, URETHRAL) ANCILLARY ONLY
Chlamydia: NEGATIVE
Chlamydia: NEGATIVE
Comment: NEGATIVE
Comment: NEGATIVE
Comment: NORMAL
Comment: NORMAL
Neisseria Gonorrhea: NEGATIVE
Neisseria Gonorrhea: NEGATIVE

## 2021-05-09 LAB — URINE CYTOLOGY ANCILLARY ONLY
Chlamydia: NEGATIVE
Comment: NEGATIVE
Comment: NORMAL
Neisseria Gonorrhea: NEGATIVE

## 2021-05-12 ENCOUNTER — Encounter: Payer: Medicare Other | Admitting: Pharmacist

## 2021-05-15 ENCOUNTER — Other Ambulatory Visit: Payer: Self-pay | Admitting: Family

## 2021-05-27 ENCOUNTER — Ambulatory Visit (INDEPENDENT_AMBULATORY_CARE_PROVIDER_SITE_OTHER): Payer: Medicare Other | Admitting: Internal Medicine

## 2021-05-27 ENCOUNTER — Encounter: Payer: Self-pay | Admitting: Internal Medicine

## 2021-05-27 DIAGNOSIS — Z0000 Encounter for general adult medical examination without abnormal findings: Secondary | ICD-10-CM

## 2021-05-27 NOTE — Progress Notes (Signed)
Subjective:   Robert Park is a 71 y.o. male who presents for Medicare Annual/Subsequent preventive examination. I connected with  Hoy Register on 05/27/21 by a audio enabled telemedicine application and verified that I am speaking with the correct person using two identifiers.  Patient Location: Home  Provider Location: Office/Clinic  I discussed the limitations of evaluation and management by telemedicine. The patient expressed understanding and agreed to proceed.  Review of Systems    Defer to PCP       Objective:    There were no vitals filed for this visit. There is no height or weight on file to calculate BMI.     05/27/2021    9:42 AM 10/21/2020   11:18 AM 06/25/2020    4:28 PM 05/16/2020   10:25 AM 02/28/2020    8:48 PM 02/05/2020   11:35 AM 08/07/2019   11:07 AM  Advanced Directives  Does Patient Have a Medical Advance Directive? Yes Yes Yes Yes No Yes Yes  Type of Paramedic of [place];Living will [place];Living will Living will;Healthcare Power of [place];Living will  [place];Living will Living will;Healthcare Power of Attorney  Does patient want to make changes to medical advance directive?  No - Patient declined No - Patient declined No - Patient declined  No - Patient declined No - Patient declined  Copy of [place] in Chart? No - copy requested No - copy requested No - copy requested No - copy requested  No - copy requested   Would patient like information on creating a medical advance directive?     No - Patient declined      Current Medications (verified) Outpatient Encounter Medications as of 05/27/2021  Medication Sig   Adalimumab (HUMIRA) 40 MG/0.4ML PSKT Inject into the skin.   albuterol (VENTOLIN HFA) 108 (90 Base) MCG/ACT inhaler Inhale 1 puff into the lungs every 6 (six) hours as needed for wheezing or shortness of breath.   aspirin 81 MG  EC tablet Take 1 tablet by mouth daily.   cabotegravir & rilpivirine ER (CABENUVA) 600 & 900 MG/3ML injection Inject 1 kit into the muscle every 2 (two) months.   cetirizine (ZYRTEC ALLERGY) 10 MG tablet Take 1 tablet (10 mg total) by mouth daily.   escitalopram (LEXAPRO) 20 MG tablet Take 1 tablet (20 mg total) by mouth daily.   ezetimibe (ZETIA) 10 MG tablet Take 1 tablet (10 mg total) by mouth daily.   fluticasone (FLOVENT HFA) 110 MCG/ACT inhaler Inhale 2 puffs into the lungs 2 (two) times daily. Flovent 175mg to 4 puffs twice daily for TWO WEEKS   ibuprofen (ADVIL) 200 MG tablet Take 200 mg by mouth every 6 (six) hours as needed.   losartan (COZAAR) 50 MG tablet Take 1 tablet (50 mg total) by mouth daily.   montelukast (SINGULAIR) 10 MG tablet Take 1 tablet (10 mg total) by mouth at bedtime.   Multiple Vitamins-Minerals (CENTRUM SILVER 50+MEN) TABS Take by mouth. Three times per week   Olopatadine HCl 0.6 % SOLN Place 2 sprays into the nose daily. two sprays per nostril 1-2 times daily as needed   tadalafil (CIALIS) 10 MG tablet Take 0.5 tablets (5 mg total) by mouth daily as needed for erectile dysfunction.   Vitamin D, Ergocalciferol, (DRISDOL) 1.25 MG (50000 UNIT) CAPS capsule TAKE 1 CAPSULE BY MOUTH EVERY 7 DAYS   No facility-administered encounter medications on file as of 05/27/2021.  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[place])    Depression    Diverticulosis    Dyslipidemia    ED (erectile dysfunction)    GERD (gastroesophageal reflux disease)    History of colon polyps    HIV infection ([place])    Hyperlipidemia    Hypertension    Internal hemorrhoids    Osteopenia    Shingles    Sleep apnea    does not wear a c-pap   Ulcerative colitis ([place])    Vitamin D deficiency     Past Surgical History:  Procedure Laterality Date   CARPAL TUNNEL RELEASE Right    x 2   CARPAL TUNNEL RELEASE Left    x 2   CATARACT EXTRACTION, BILATERAL Bilateral 10/2017   COLONOSCOPY     HEMORRHOID SURGERY     MOUTH SURGERY     TONSILLECTOMY AND ADENOIDECTOMY     age 12   UPPER GASTROINTESTINAL ENDOSCOPY     VASECTOMY     Family History  Problem Relation Age of Onset   Colon cancer Mother 64   Heart failure Mother    Cervical cancer Mother    Leukemia Mother    Diabetes Mother    Lung cancer Father    Diabetes Sister    Diverticulitis Brother    Diabetes Maternal Grandmother    Diabetes Brother    Hypertension Brother    Heart disease Brother    Hypertension Sister    Kidney disease Sister    Post-traumatic stress disorder Son    Hypertension Son    Hyperlipidemia Son    Anxiety disorder Son    Esophageal cancer Neg Hx    Rectal cancer Neg Hx    Stomach cancer Neg Hx    Social History   Socioeconomic History   Marital status: Divorced    Spouse name: Not on file   Number of children: 2   Years of education: Not on file   Highest education level: Not on file  Occupational History   Occupation: Financial planner    Comment: Disabled   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  Tobacco Use   Smoking status: Former    Types: Cigarettes    Quit date: 06/06/1987    Years since quitting: 33.9   Smokeless tobacco: Never   Tobacco comments:    quit 34 years ago  Vaping Use   Vaping Use: Never used  Substance and Sexual Activity   Alcohol use: Yes    Comment: every other day -social   Drug use: Not Currently    Types: Marijuana    Comment: occassional usage - last smoked 3 months ago   Sexual activity: Yes    Partners: Male    Comment: declined condoms  Other Topics Concern   Not on file  Social History Narrative   Current Social History 05/16/2020        Patient lives with house mate in a two level townhome with handrails inside  and 1-2 outside steps without handrails       Patient's method of transportation is personal car.      The highest level of education was 2 years college.      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 Also, disabled from [place]       Identified important relationships are house mate, Cecile Hearing       Pets : 2 rescue cats from [place]       Interests / Fun: Going to gym, day trips ([place]), different restaurants       Current Stressors: Worry about son with PTSD (15 years service in [place] and [place])       Religious / Personal Beliefs: "A bit Spiritual."       L. [place], BSN, RN-BC       Social Determinants of Health   Financial Resource Strain: Low Risk    Difficulty of Paying Living Expenses: Not hard at all  Food Insecurity: No Food Insecurity   Worried About Charity fundraiser in the Last Year: Never true   Arboriculturist in the Last Year: Never true  Transportation Needs: No Transportation Needs   Lack of Transportation (Medical): No   Lack of Transportation (Non-Medical): No  Physical Activity: Insufficiently Active   Days of Exercise per Week: 2 days   Minutes of Exercise per Session: 60 min  Stress: No Stress Concern Present   Feeling of Stress : Not at all  Social Connections: Unknown   Frequency of Communication with Friends and Family: Twice a week   Frequency of Social Gatherings with Friends and Family: Not on file   Attends Religious Services: Never   Marine scientist or Organizations: No   Attends Music therapist: Never   Marital Status: Divorced    Tobacco Counseling Counseling given: Not Answered Tobacco comments: quit 34 years ago   Clinical Intake:  Pre-visit preparation completed: Yes  Pain : No/denies pain     Nutritional Risks: None Diabetes: No  How often do you need to have someone help you when you read instructions, pamphlets, or other written materials from your  doctor or pharmacy?: 1 - Never What is the last grade level you completed in school?: college 2 years  Diabetic?No  Interpreter Needed?: No  Information entered by :: Corey Skains Albertus Chiarelli,cma 05/27/21 9:34am   Activities of Daily Living    05/27/2021    9:47 AM 10/21/2020   11:15 AM  In your present state of health, do you have any difficulty performing the following activities:  Hearing? 1 1  Comment  hearing aids  Vision? 0 0  Difficulty concentrating or making decisions? 0 0  Walking or climbing stairs? 0 0  Dressing or bathing? 0 0  Doing errands, shopping? 0 0    Patient Care Team: Axel Filler, MD as PCP - General (Internal Medicine) Golden Circle, FNP as Nurse Practitioner (Family Medicine) Danis, Kirke Corin, MD as Consulting Physician (Gastroenterology) Lavonna Monarch, MD as Consulting Physician (Dermatology)  Indicate any recent Medical Services you may have received from other than [facility] providers in the past year (date may be approximate).     Assessment:   This is a routine wellness examination for Robert Park.  Hearing/Vision screen No results found.  Dietary issues and exercise activities discussed:     Goals Addressed   None   Depression Screen    05/27/2021    9:39 AM 03/11/2021   10:31 AM 10/21/2020   11:16 AM 09/17/2020   10:08 AM 06/25/2020    4:52 PM 05/16/2020   10:48 AM 05/10/2020   10:13 AM  PHQ 2/9 Scores  PHQ - 2  Score 0 0 0 0 0 0 0  PHQ- 9 Score     2      Fall Risk    05/27/2021    9:43 AM 03/11/2021   10:31 AM 10/21/2020   11:15 AM 09/17/2020   10:08 AM 06/25/2020    4:29 PM  [place] in the past year? 1 0 0 0 0  Number falls in past yr: 0 0 0  0  Injury with Fall? 0 0 0  0  Risk for fall due to : Other (Comment) No Fall Risks   No Fall Risks  Risk for fall due to: Comment blood pressure      Follow up Falls evaluation completed;Falls prevention discussed Falls evaluation completed Falls evaluation completed  Falls  evaluation completed    FALL RISK PREVENTION PERTAINING TO THE HOME:  Any stairs in or around the home? Yes  If so, are there any without handrails? No  Home free of loose throw rugs in walkways, pet beds, electrical cords, etc? Yes  Adequate lighting in your home to reduce risk of falls? Yes   ASSISTIVE DEVICES UTILIZED TO PREVENT FALLS:  Life alert? No  Use of a cane, walker or w/c? No  Grab bars in the bathroom? No  Shower chair or bench in shower? No  Elevated toilet seat or a handicapped toilet? No   TIMED UP AND GO:  Was the test performed? No .  Length of time to ambulate 10 feet: N/A sec.   Gait steady and fast without use of assistive device  Cognitive Function:        05/27/2021    9:48 AM  6CIT Screen  What Year? 0 points  What month? 0 points  What time? 0 points  Count back from 20 0 points  Months in reverse 0 points  Repeat phrase 0 points  Total Score 0 points    Immunizations Immunization History  Administered Date(s) Administered   Influenza-Unspecified 12/25/2014, 10/22/2015, 10/08/2016, 10/13/2019, 10/14/2020   Meningococcal Mcv4o 10/08/2016, 12/08/2016   PFIZER Comirnaty(Gray Top)Covid-19 Tri-Sucrose Vaccine 05/10/2020   PFIZER(Purple Top)SARS-COV-2 Vaccination 02/26/2019, 03/20/2019, 10/14/2019   Pfizer Covid-19 Vaccine Bivalent Booster 13yr & up 01/21/2021   Pneumococcal Conjugate-13 08/07/2019, 03/04/2020   Pneumococcal Polysaccharide-23 05/01/2014   Tdap 02/19/2014   Zoster Recombinat (Shingrix) 10/14/2020    TDAP status: Up to date  Flu Vaccine status: Up to date  Pneumococcal vaccine status: Up to date  Covid-19 vaccine status: Completed vaccines  Qualifies for Shingles Vaccine? Yes   Zostavax completed Yes   Shingrix Completed?: Yes  Screening Tests Health Maintenance  Topic Date Due   Zoster Vaccines- Shingrix (2 of 2) 12/09/2020   Pneumonia Vaccine 70 Years old (319 03/04/2021   INFLUENZA VACCINE  08/12/2021    COLONOSCOPY (Pts 45-429yrInsurance coverage will need to be confirmed)  01/23/2023   TETANUS/TDAP  02/20/2024   COVID-19 Vaccine  Completed   Hepatitis C Screening  Completed   HPV VACCINES  Aged Out    Health Maintenance  Health Maintenance Due  Topic Date Due   Zoster Vaccines- Shingrix (2 of 2) 12/09/2020   Pneumonia Vaccine 6558Years old (3)6902/21/2023    Colorectal cancer screening: Type of screening: Colonoscopy. Completed 01/22/2021. Repeat every 2 years  Lung Cancer Screening: (Low Dose CT Chest recommended if Age 70-80ears, 30 pack-year currently smoking OR have quit w/in 15years.) does not qualify.   Lung Cancer Screening Referral: N/A  Additional Screening:  Hepatitis C Screening: does not qualify; Completed 02/01/2019  Vision Screening: Recommended annual ophthalmology exams for early detection of glaucoma and other disorders of the eye. Is the patient up to date with their annual eye exam?  No  Who is the provider or what is the name of the office in which the patient attends annual eye exams? N/A If pt is not established with a provider, would they like to be referred to a provider to establish care? Yes .   Dental Screening: Recommended annual dental exams for proper oral hygiene  Community Resource Referral / Chronic Care Management: CRR required this visit?  Yes   CCM required this visit?  No      Plan:     I have personally reviewed and noted the following in the patient's chart:   Medical and social history Use of alcohol, tobacco or illicit drugs  Current medications and supplements including opioid prescriptions. Patient is not currently taking opioid prescriptions. Functional ability and status Nutritional status Physical activity Advanced directives List of other physicians Hospitalizations, surgeries, and ER visits in previous 12 months Vitals Screenings to include cognitive, depression, and falls Referrals and appointments  In  addition, I have reviewed and discussed with patient certain preventive protocols, quality metrics, and best practice recommendations. A written personalized care plan for preventive services as well as general preventive health recommendations were provided to patient.     Kerin Perna, [place]   05/27/2021   Nurse Notes: Non Face-to-Face 25 minute visit  Robert Park , Thank you for taking time to come for your Medicare Wellness Visit. I appreciate your ongoing commitment to your health goals. Please review the following plan we discussed and let me know if I can assist you in the future.   These are the goals we discussed:  Goals       Exercise 3x per week (40 min per time) (pt-stated)      Going to gym      Weight (lb) < 200 lb (90.7 kg) (pt-stated)      Goal is to lose this weight prior to trip in October 2022        This is a list of the screening recommended for you and due dates:  Health Maintenance  Topic Date Due   Zoster (Shingles) Vaccine (2 of 2) 12/09/2020   Pneumonia Vaccine (3) 03/04/2021   Flu Shot  08/12/2021   Colon Cancer Screening  01/23/2023   Tetanus Vaccine  02/20/2024   COVID-19 Vaccine  Completed   Hepatitis C Screening: USPSTF Recommendation to screen - Ages 18-79 yo.  Completed   HPV Vaccine  Aged Out

## 2021-06-02 NOTE — Progress Notes (Signed)
Internal Medicine Clinic Attending  I reviewed the AWV findings.  I agree with the assessment, diagnosis, and plan of care documented in the AWV note.     

## 2021-06-26 ENCOUNTER — Encounter: Payer: Self-pay | Admitting: Allergy & Immunology

## 2021-06-26 ENCOUNTER — Ambulatory Visit: Payer: Medicare Other | Admitting: Allergy & Immunology

## 2021-06-26 VITALS — BP 138/92 | HR 69 | Temp 98.7°F | Ht 67.0 in | Wt 228.0 lb

## 2021-06-26 DIAGNOSIS — J4530 Mild persistent asthma, uncomplicated: Secondary | ICD-10-CM | POA: Diagnosis not present

## 2021-06-26 DIAGNOSIS — J3089 Other allergic rhinitis: Secondary | ICD-10-CM | POA: Diagnosis not present

## 2021-06-26 DIAGNOSIS — J302 Other seasonal allergic rhinitis: Secondary | ICD-10-CM

## 2021-06-26 MED ORDER — ALBUTEROL SULFATE HFA 108 (90 BASE) MCG/ACT IN AERS
1.0000 | INHALATION_SPRAY | Freq: Four times a day (QID) | RESPIRATORY_TRACT | 2 refills | Status: DC | PRN
Start: 2021-06-26 — End: 2022-01-22

## 2021-06-26 MED ORDER — MONTELUKAST SODIUM 10 MG PO TABS
10.0000 mg | ORAL_TABLET | Freq: Every day | ORAL | 5 refills | Status: DC
Start: 2021-06-26 — End: 2022-01-22

## 2021-06-26 MED ORDER — CETIRIZINE HCL 10 MG PO TABS: 10.0000 mg | ORAL_TABLET | Freq: Every day | ORAL | 2 refills | Status: DC

## 2021-06-26 MED ORDER — FLUTICASONE PROPIONATE HFA 110 MCG/ACT IN AERO: 2.0000 | INHALATION_SPRAY | Freq: Two times a day (BID) | RESPIRATORY_TRACT | 5 refills | Status: DC

## 2021-06-26 NOTE — Progress Notes (Signed)
FOLLOW UP  Date of Service/Encounter:  06/26/21   Assessment:   Mild persistent asthma, uncomplicated - with much better control since starting the Flovent    Seasonal and perennial allergic rhinitis (grasses, ragweed, weeds, trees, cat, and dog)   Complicated past medical history including well-controlled HIV as well as ulcerative colitis  Plan/Recommendations:   1. Mild persistent asthma, uncomplicated - Lung testing looks amazing today!  - I am not going to make any changes at all today.   - Daily controller medication(s): Flovent 129mg 2 puffs twice daily with spacer - Prior to physical activity: albuterol 2 puffs 10-15 minutes before physical activity. - Rescue medications: albuterol 4 puffs every 4-6 hours as needed - Changes during respiratory infections or worsening symptoms: Increase Flovent 1137m to 4 puffs twice daily for TWO WEEKS. - Asthma control goals:  * Full participation in all desired activities (may need albuterol before activity) * Albuterol use two time or less a week on average (not counting use with activity) * Cough interfering with sleep two time or less a month * Oral steroids no more than once a year * No hospitalizations  2. Chronic rhinitis (grasses, ragweed, weeds, trees, cat, and dog) - It seems that everything is well controlled. - We are not going to make any changes at this time.  - Continue with: Zyrtec (cetirizine) 1030mablet 1-2 times daily and Flonase (fluticasone) two sprays per nostril daily - Continue with: Singulair (montelukast) 54m75mily and Patanase (olopatadine) two sprays per nostril 1-2 times daily as needed - Singulair can cause irritability and depression, so be aware of that and stop it if it happens, however, most people tolerate it without a problem - You can use an extra dose of the antihistamine, if needed, for breakthrough symptoms.  - Consider nasal saline rinses 1-2 times daily to remove allergens from the nasal  cavities as well as help with mucous clearance (this is especially helpful to do before the nasal sprays are given) - Consider allergy shots as a means of long-term control.  3. Return in about 6 months (around 12/26/2021).    Subjective:   Robert Park 71 y7. male presenting today for follow up of  Chief Complaint  Patient presents with   Follow-up    [name] a history of the following: Patient Active Problem List   Diagnosis Date Noted   COVID-19 virus infection 11/21/2020   Mild persistent asthma, uncomplicated 06/224/26/8341easonal and perennial allergic rhinitis 07/02/2020   Upper airway cough syndrome 06/25/2020   Allergic sinusitis 02/05/2020   Hypertension 08/07/2019   Prediabetes 08/07/2019   Hyperlipidemia 08/07/2019   GAD (generalized anxiety disorder) 08/07/2019   Localized osteoarthritis of left hand 08/07/2019   Colon polyps 06/06/2019   Human immunodeficiency virus (HIV) disease (HCC)[place]/04/2019   Screening for STDs (sexually transmitted diseases) 02/16/2019   Osteopenia 02/16/2019   Healthcare maintenance 02/16/2019   Ulcerative colitis without complications (HCC)Eugenio Saenz/096/22/2979History obtained from: chart review and patient.  [name] 71 y38. male presenting for a follow up visit.  He was last seen in January 2023.  At that time, his lung testing looked amazing.  We continued with the Flovent 110 mcg 2 puffs twice daily as well as albuterol as needed.  For his chronic rhinitis, we continue with Zyrtec as well as Singulair.  We also continued with Patanase and Flonase.  Since last visit, he has done fairly well.  He is planning to go to [place] next week. He is doing a one week [place] next week.  This will be his 49th state that he is visited.  He is not really interested in going to [place].  Asthma/Respiratory Symptom History: He has had worsening symptoms over the last couple of weeks. He is feeling good for the most part. He has  not needed prednisone and has not been in ED. Trygve's asthma has been well controlled. He has not required rescue medication, experienced nocturnal awakenings due to lower respiratory symptoms, nor have activities of daily living been limited. He has required no Emergency Department or Urgent Care visits for his asthma. He has required zero courses of systemic steroids for asthma exacerbations since the last visit. ACT score today is 25, indicating excellent asthma symptom control.  He feels much better since he has been seeing [place].  He is very Patent attorney.  Allergic Rhinitis Symptom History: Environmental allergies are under good control with Zyrtec and the Singulair.  He uses the nose sprays rarely, but he will add them when his symptoms are out of control.  He has not been on antibiotics at all since last visit.  Overall, he is doing fairly well.  He has HIV and is on Cabenuva every two months. He has ulcerative colitis and used Humira every 2 weeks. He has been on this for 3 years and has not been in the hospital at all since then.  As well has been as well again no somewhat she was ohMs. He sees Dr. Loletha Carrow for management of his ulcerative colitis.  He is very happy with Dr. Loletha Carrow and credits him with saving his life.  Evidently, his old gastroenterologist down in [place] only wanted to remove his colon.  Otherwise, there have been no changes to his past medical history, surgical history, family history, or social history.    Review of Systems  Constitutional: Negative.  Negative for chills, fever, malaise/fatigue and weight loss.  HENT: Negative.  Negative for congestion, ear discharge, ear pain and sinus pain.   Eyes:  Negative for pain, discharge and redness.  Respiratory:  Negative for cough, sputum production, shortness of breath and wheezing.   Cardiovascular: Negative.  Negative for chest pain and palpitations.  Gastrointestinal:  Negative for abdominal pain, constipation, diarrhea,  heartburn, nausea and vomiting.  Skin: Negative.  Negative for itching and rash.  Neurological:  Negative for dizziness and headaches.  Endo/Heme/Allergies:  Negative for environmental allergies. Does not bruise/bleed easily.  All other systems reviewed and are negative.      Objective:   Blood pressure (!) 138/92, pulse 69, temperature 98.7 F (37.1 C), height 5' 7"  (1.702 m), weight 228 lb (103.4 kg), SpO2 95 %. Body mass index is 35.71 kg/m.    Physical Exam Vitals reviewed.  Constitutional:      Appearance: He is well-developed.  HENT:     Head: Normocephalic and atraumatic.     Right Ear: Tympanic membrane, ear canal and external ear normal.     Left Ear: Tympanic membrane, ear canal and external ear normal.     Nose: No nasal deformity, septal deviation, mucosal edema or rhinorrhea.     Right Turbinates: Enlarged and swollen.     Left Turbinates: Enlarged and swollen.     Right Sinus: No maxillary sinus tenderness or frontal sinus tenderness.     Left Sinus: No maxillary sinus tenderness or frontal sinus tenderness.     Mouth/Throat:  Mouth: Mucous membranes are not pale and not dry.     Pharynx: Uvula midline.  Eyes:     General: Lids are normal. No allergic shiner.       Right eye: No discharge.        Left eye: No discharge.     Conjunctiva/sclera: Conjunctivae normal.     Right eye: Right conjunctiva is not injected. No chemosis.    Left eye: Left conjunctiva is not injected. No chemosis.    Pupils: Pupils are equal, round, and reactive to light.  Cardiovascular:     Rate and Rhythm: Normal rate and regular rhythm.     Heart sounds: Normal heart sounds.  Pulmonary:     Effort: Pulmonary effort is normal. No tachypnea, accessory muscle usage or respiratory distress.     Breath sounds: Normal breath sounds. No wheezing, rhonchi or rales.  Chest:     Chest wall: No tenderness.  Lymphadenopathy:     Cervical: No cervical adenopathy.  Skin:    Coloration:  Skin is not pale.     Findings: No abrasion, erythema, petechiae or rash. Rash is not papular, urticarial or vesicular.  Neurological:     Mental Status: He is alert.  Psychiatric:        Behavior: Behavior is cooperative.      Diagnostic studies:    Spirometry: results normal (FEV1: 2.86/98%, FVC: 3.42/90%, FEV1/FVC: 84%).    Spirometry consistent with normal pattern.   Allergy Studies: none        Salvatore Marvel, MD  [facility] [place]

## 2021-06-26 NOTE — Patient Instructions (Addendum)
1. Mild persistent asthma, uncomplicated - Lung testing looks amazing today!  - I am not going to make any changes at all today.   - Daily controller medication(s): Flovent 18mg 2 puffs twice daily with spacer - Prior to physical activity: albuterol 2 puffs 10-15 minutes before physical activity. - Rescue medications: albuterol 4 puffs every 4-6 hours as needed - Changes during respiratory infections or worsening symptoms: Increase Flovent 1179m to 4 puffs twice daily for TWO WEEKS. - Asthma control goals:  * Full participation in all desired activities (may need albuterol before activity) * Albuterol use two time or less a week on average (not counting use with activity) * Cough interfering with sleep two time or less a month * Oral steroids no more than once a year * No hospitalizations  2. Chronic rhinitis (grasses, ragweed, weeds, trees, cat, and dog) - It seems that everything is well controlled. - We are not going to make any changes at this time.  - Continue with: Zyrtec (cetirizine) 105mablet 1-2 times daily and Flonase (fluticasone) two sprays per nostril daily - Continue with: Singulair (montelukast) 34m67mily and Patanase (olopatadine) two sprays per nostril 1-2 times daily as needed - Singulair can cause irritability and depression, so be aware of that and stop it if it happens, however, most people tolerate it without a problem - You can use an extra dose of the antihistamine, if needed, for breakthrough symptoms.  - Consider nasal saline rinses 1-2 times daily to remove allergens from the nasal cavities as well as help with mucous clearance (this is especially helpful to do before the nasal sprays are given) - Consider allergy shots as a means of long-term control.  3. Return in about 6 months (around 12/26/2021).    Please inform us oKoreaany Emergency Department visits, hospitalizations, or changes in symptoms. Call us bKoreaore going to the ED for breathing or allergy  symptoms since we might be able to fit you in for a sick visit. Feel free to contact us aKoreatime with any questions, problems, or concerns.  It was a pleasure to see you again today!  Websites that have reliable patient information: 1. American Academy of Asthma, Allergy, and Immunology: www.aaaai.org 2. Food Allergy Research and Education (FARE): foodallergy.org 3. Mothers of Asthmatics: http://www.asthmacommunitynetwork.org 4. American College of Allergy, Asthma, and Immunology: www.acaai.org   COVID-19 Vaccine Information can be found at: [email or web address] questions related to vaccine distribution or appointments, please email [email or web address] or call [phone] realize that you might be concerned about having an allergic reaction to the COVID19 vaccines. To help with that concern, WE ARE OFFERING THE COVID19 VACCINES IN OUR OFFICE! Ask the front desk for dates!     "Like" us oKoreaFacebook and Instagram for our latest updates!      A healthy democracy works best when ALL [employer] [place] [employer]uranc[employer]ipate! Make sure you are registered to vote! If you have moved or changed any of your contact information, you will need to get this updated before voting!  In some cases, you MAY be able to register to vote online: [email or web address]

## 2021-06-30 ENCOUNTER — Other Ambulatory Visit (HOSPITAL_COMMUNITY): Payer: Self-pay

## 2021-07-01 ENCOUNTER — Telehealth: Payer: Self-pay

## 2021-07-01 NOTE — Telephone Encounter (Signed)
[facility] Patient Advocate Encounter  Patient's medication(Cabenuva) have been couriered to [facility] from [facility] and will be administered on the patient next office visit on 07/10/21.  [name] , [facility] Patient [facility] Phone: [phone] Fax:  [phone]

## 2021-07-10 ENCOUNTER — Encounter: Payer: Medicare Other | Admitting: Pharmacist

## 2021-07-16 ENCOUNTER — Other Ambulatory Visit: Payer: Self-pay

## 2021-07-16 ENCOUNTER — Ambulatory Visit (INDEPENDENT_AMBULATORY_CARE_PROVIDER_SITE_OTHER): Payer: Medicare Other | Admitting: Pharmacist

## 2021-07-16 DIAGNOSIS — B20 Human immunodeficiency virus [HIV] disease: Secondary | ICD-10-CM | POA: Diagnosis not present

## 2021-07-16 MED ORDER — CABOTEGRAVIR & RILPIVIRINE ER 600 & 900 MG/3ML IM SUER
1.0000 | Freq: Once | INTRAMUSCULAR | Status: AC
  Administered 2021-07-16: 1 via INTRAMUSCULAR

## 2021-07-16 NOTE — Progress Notes (Signed)
HPI: Robert Park is a 71 y.o. male who presents to the [place] clinic for [place] administration.  Patient Active Problem List   Diagnosis Date Noted   COVID-19 virus infection 11/21/2020   Mild persistent asthma, uncomplicated 78/93/8101   Seasonal and perennial allergic rhinitis 07/02/2020   Upper airway cough syndrome 06/25/2020   Allergic sinusitis 02/05/2020   Hypertension 08/07/2019   Prediabetes 08/07/2019   Hyperlipidemia 08/07/2019   GAD (generalized anxiety disorder) 08/07/2019   Localized osteoarthritis of left hand 08/07/2019   Colon polyps 06/06/2019   Human immunodeficiency virus (HIV) disease ([place]) 02/16/2019   Screening for STDs (sexually transmitted diseases) 02/16/2019   Osteopenia 02/16/2019   Healthcare maintenance 02/16/2019   Ulcerative colitis without complications ([place]) 75/10/2583    Patient's Medications  New Prescriptions   No medications on file  Previous Medications   ADALIMUMAB (HUMIRA) 40 MG/0.4ML PSKT    Inject into the skin.   ALBUTEROL (VENTOLIN HFA) 108 (90 BASE) MCG/ACT INHALER    Inhale 1 puff into the lungs every 6 (six) hours as needed for wheezing or shortness of breath.   ASPIRIN 81 MG EC TABLET    Take 1 tablet by mouth daily.   CABOTEGRAVIR & RILPIVIRINE ER (CABENUVA) 600 & 900 MG/3ML INJECTION    Inject 1 kit into the muscle every 2 (two) months.   CETIRIZINE (ZYRTEC ALLERGY) 10 MG TABLET    Take 1 tablet (10 mg total) by mouth daily.   ESCITALOPRAM (LEXAPRO) 20 MG TABLET    Take 1 tablet (20 mg total) by mouth daily.   EZETIMIBE (ZETIA) 10 MG TABLET    Take 1 tablet (10 mg total) by mouth daily.   FLUTICASONE (FLOVENT HFA) 110 MCG/ACT INHALER    Inhale 2 puffs into the lungs 2 (two) times daily. Flovent 122mg to 4 puffs twice daily for TWO WEEKS   IBUPROFEN (ADVIL) 200 MG TABLET    Take 200 mg by mouth every 6 (six) hours as needed.   LOSARTAN (COZAAR) 50 MG TABLET    Take 1 tablet (50 mg total) by mouth daily.    MONTELUKAST (SINGULAIR) 10 MG TABLET    Take 1 tablet (10 mg total) by mouth at bedtime.   MULTIPLE VITAMINS-MINERALS (CENTRUM SILVER 50+MEN) TABS    Take by mouth. Three times per week   OLOPATADINE HCL 0.6 % SOLN    Place 2 sprays into the nose daily. two sprays per nostril 1-2 times daily as needed   TADALAFIL (CIALIS) 10 MG TABLET    Take 0.5 tablets (5 mg total) by mouth daily as needed for erectile dysfunction.   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[place]    Depression    Diverticulosis    Dyslipidemia    ED (erectile dysfunction)    GERD (gastroesophageal reflux disease)    History of colon polyps    HIV  infection ([place])    Hyperlipidemia    Hypertension    Internal hemorrhoids    Osteopenia    Shingles    Sleep apnea    does not wear a c-pap   Ulcerative colitis ([place])    Vitamin D deficiency     Social History: Social History   Socioeconomic History   Marital status: Divorced    Spouse name: Not on file   Number of children: 2   Years of education: Not on file   Highest education level: Not on file  Occupational History   Occupation: Financial planner    Comment: Disabled   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  Tobacco Use   Smoking status: Former    Types: Cigarettes    Quit date: 06/06/1987    Years since quitting: 34.1   Smokeless tobacco: Never   Tobacco comments:    quit 34 years ago  Vaping Use   Vaping Use: Never used  Substance and Sexual  Activity   Alcohol use: Yes    Comment: every other day -social   Drug use: Not Currently    Types: Marijuana    Comment: occassional usage - last smoked 3 months ago   Sexual activity: Yes    Partners: Male    Comment: declined condoms  Other Topics Concern   Not on file  Social History Narrative   Current Social History 05/16/2020        Patient lives with house mate in a two level townhome with handrails inside and 1-2 outside steps without handrails       Patient's method of transportation is personal car.      The highest level of education was 2 years college.      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 Also, disabled from [place]       Identified important relationships are house mate, Cecile Hearing       Pets : 2 rescue cats from [place]       Interests / Fun: Going to gym, day trips ([place]), different restaurants       Current Stressors: Worry about son with PTSD (15 years service in [place] and [place])       Religious / Personal Beliefs: "A bit Spiritual."       L. Ducatte, BSN, RN-BC       Social Determinants of Health   Financial Resource Strain: Low Risk  (05/27/2021)   Overall Financial Resource Strain (CARDIA)    Difficulty of Paying Living Expenses: Not hard at all  Food Insecurity: No Food Insecurity (05/27/2021)   Hunger Vital Sign    Worried About Running Out of Food in the Last Year: Never true    [place] in the Last Year: Never true  Transportation Needs: No Transportation Needs (05/27/2021)   PRAPARE - Hydrologist (Medical): No    Lack of Transportation (Non-Medical): No  Physical Activity: Insufficiently Active (05/27/2021)   Exercise Vital Sign    Days of Exercise per Week: 2 days    Minutes of Exercise per Session: 60 min  Stress: No Stress Concern Present (05/27/2021)   [place]    Feeling of Stress :  Not at all  Social Connections: Unknown (05/27/2021)   Social Connection and Isolation Panel [NHANES]    Frequency of Communication with Friends and Family:  Twice a week    Frequency of Social Gatherings with Friends and Family: Not on file    Attends Religious Services: Never    Active Member of Clubs or Organizations: No    Attends Archivist Meetings: Never    Marital Status: Divorced    Labs: Lab Results  Component Value Date   HIV1RNAQUANT Not Detected 03/11/2021   HIV1RNAQUANT Not Detected 09/17/2020   HIV1RNAQUANT Not Detected 07/16/2020   CD4TABS 962 09/17/2020   CD4TABS 1,261 04/09/2020   CD4TABS 1,097 08/18/2019    RPR and STI Lab Results  Component Value Date   LABRPR REACTIVE (A) 03/11/2021   LABRPR REACTIVE (A) 08/26/2020   LABRPR REACTIVE (A) 07/16/2020   LABRPR REACTIVE (A) 02/23/2020   LABRPR NON-REACTIVE 05/16/2019   RPRTITER 1:2 (H) 03/11/2021   RPRTITER 1:4 (H) 08/26/2020   RPRTITER 1:4 (H) 07/16/2020   RPRTITER 1:32 (H) 02/23/2020   RPRTITER 1:1 (H) 02/01/2019    STI Results GC CT  05/08/2021  2:03 PM Negative    Negative    Negative  Negative    Negative    Negative   08/26/2020  4:05 PM Negative    Positive    Positive  Negative    Negative    Negative   02/01/2019  2:30 PM Negative  Negative     Hepatitis B Lab Results  Component Value Date   HEPBSAB REACTIVE (A) 02/01/2019   HEPBSAG NON-REACTIVE 02/01/2019   HEPBCAB NON-REACTIVE 02/01/2019   Hepatitis C Lab Results  Component Value Date   HEPCAB NON-REACTIVE 02/01/2019   Hepatitis A Lab Results  Component Value Date   HAV REACTIVE (A) 02/01/2019   Lipids: Lab Results  Component Value Date   CHOL 261 (H) 03/11/2021   TRIG 370 (H) 03/11/2021   HDL 49 03/11/2021   CHOLHDL 5.3 (H) 03/11/2021   LDLCALC 155 (H) 03/11/2021    TARGET DATE:  The 29th of the month  Current HIV Regimen: Cabenuva  Assessment: Robert Park presents today for their maintenance Cabenuva  injections. Initial/past injections were tolerated well without issues. No problems with systemic effects of injections. Patient does experience slight soreness and back stiffness for a day after the injection.   Administered cabotegravir 613m/3mL in left upper outer quadrant of the gluteal muscle. Administered rilpivirine 900 mg/368min the right upper outer quadrant of the gluteal muscle. Monitored patient for 10 minutes after injection. Injections were tolerated well without issue. Patient will follow up in 2 months for next injection.  Plan: - Cabenuva injections administered - Next injections scheduled for 8/24 with Cassie  - Call with any issues or questions  AmAlfonse SprucePharmD, CPP Clinical Pharmacist Practitioner InIoniaor Infectious Disease

## 2021-07-18 LAB — HIV-1 RNA QUANT-NO REFLEX-BLD
HIV 1 RNA Quant: <20 Copies/mL — ABNORMAL HIGH
HIV-1 RNA Quant, Log: <1.3 Log cps/mL — ABNORMAL HIGH

## 2021-08-18 ENCOUNTER — Ambulatory Visit: Payer: Medicare Other | Admitting: Dermatology

## 2021-08-20 ENCOUNTER — Ambulatory Visit: Payer: Self-pay | Admitting: Licensed Clinical Social Worker

## 2021-08-20 ENCOUNTER — Other Ambulatory Visit (HOSPITAL_COMMUNITY): Payer: Self-pay

## 2021-08-20 ENCOUNTER — Ambulatory Visit: Payer: Medicare Other | Admitting: Dermatology

## 2021-08-20 NOTE — Patient Instructions (Signed)
Visit Information  Instructions:   Patient was given the following information about care management and care coordination services today, agreed to services, and gave verbal consent: 1.care management/care coordination services include personalized support from designated clinical staff supervised by their physician, including individualized plan of care and coordination with other care providers 2. 24/7 contact phone numbers for assistance for urgent and routine care needs. 3. The patient may stop care management/care coordination services at any time by phone call to the office staff.  Patient verbalizes understanding of instructions and care plan provided today and agrees to view in [place]. Active MyChart status and patient understanding of how to access instructions and care plan via MyChart confirmed with patient.     No further follow up required: .  Lenor Derrick, MSW  Social Worker [facility] Care Management  [phone]

## 2021-08-20 NOTE — Patient Outreach (Signed)
  Care Coordination   Initial Visit Note   08/20/2021 Name: Robert Park MRN: [id number] DOB: 02/15/1950  Robert Park is a 71 y.o. year old male who sees Evette Doffing, Mallie Mussel, MD for primary care. I spoke with  Hoy Register by phone today  What matters to the patients health and wellness today?  Referral for Dermatologist.     Goals Addressed               This Visit's Progress     care coordination (pt-stated)        . Patient would like a referral for a dermatologist within the [facility] network. SW sent careteam an inbasket with request.   . SDOH completed. No additional needs or interventions.        SDOH assessments and interventions completed:  Yes{[facility] Tip this will not be part of the note when signed-REQUIRED REPORT FIELD DO NOT DELETE (Optional):27901}     Care Coordination Interventions Activated:  Yes {[facility]N Tip this will not be part of the note when signed-REQUIRED REPORT FIELD DO NOT DELETE (Optional):27901} Care Coordination Interventions:  Yes, provided {[facility] Tip this will not be part of the note when signed-REQUIRED REPORT FIELD DO NOT DELETE (Optional):27901}  Follow up plan: No further intervention required.   Encounter Outcome:  Pt. Visit Completed {[facility] Tip this will not be part of the note when signed-REQUIRED REPORT FIELD DO NOT DELETE (Optional):27901}  Robert Park, MSW  Social Worker [facility] Care Management  [phone]

## 2021-08-21 ENCOUNTER — Other Ambulatory Visit (HOSPITAL_COMMUNITY): Payer: Self-pay

## 2021-08-22 ENCOUNTER — Telehealth: Payer: Self-pay

## 2021-08-22 NOTE — Telephone Encounter (Signed)
[facility] Patient Advocate Encounter  Patient's medication Robert Park) have been couriered to [facility] from [facility] and will be administered on the patient next office visit on 09/04/21.  Robert Park , [name] Patient [name] Phone: [phone] Fax:  [phone]

## 2021-08-25 ENCOUNTER — Other Ambulatory Visit (HOSPITAL_COMMUNITY): Payer: Self-pay

## 2021-09-04 ENCOUNTER — Other Ambulatory Visit: Payer: Self-pay | Admitting: Family

## 2021-09-04 ENCOUNTER — Ambulatory Visit (INDEPENDENT_AMBULATORY_CARE_PROVIDER_SITE_OTHER): Payer: Medicare Other | Admitting: Pharmacist

## 2021-09-04 ENCOUNTER — Other Ambulatory Visit: Payer: Self-pay

## 2021-09-04 ENCOUNTER — Ambulatory Visit: Payer: Medicare Other

## 2021-09-04 ENCOUNTER — Encounter: Payer: Medicare Other | Admitting: Pharmacist

## 2021-09-04 DIAGNOSIS — B20 Human immunodeficiency virus [HIV] disease: Secondary | ICD-10-CM | POA: Diagnosis not present

## 2021-09-04 DIAGNOSIS — M19042 Primary osteoarthritis, left hand: Secondary | ICD-10-CM

## 2021-09-04 MED ORDER — CABOTEGRAVIR & RILPIVIRINE ER 600 & 900 MG/3ML IM SUER
1.0000 | Freq: Once | INTRAMUSCULAR | Status: AC
  Administered 2021-09-04: 1 via INTRAMUSCULAR

## 2021-09-04 NOTE — Progress Notes (Signed)
HPI: Robert Park is a 71 y.o. male who presents to the [place] clinic for [place] administration.  Patient Active Problem List   Diagnosis Date Noted   COVID-19 virus infection 11/21/2020   Mild persistent asthma, uncomplicated 90/24/0973   Seasonal and perennial allergic rhinitis 07/02/2020   Upper airway cough syndrome 06/25/2020   Allergic sinusitis 02/05/2020   Hypertension 08/07/2019   Prediabetes 08/07/2019   Hyperlipidemia 08/07/2019   GAD (generalized anxiety disorder) 08/07/2019   Localized osteoarthritis of left hand 08/07/2019   Colon polyps 06/06/2019   Human immunodeficiency virus (HIV) disease ([place]) 02/16/2019   Screening for STDs (sexually transmitted diseases) 02/16/2019   Osteopenia 02/16/2019   Healthcare maintenance 02/16/2019   Ulcerative colitis without complications ([place]) 53/29/9242    Patient's Medications  New Prescriptions   No medications on file  Previous Medications   ADALIMUMAB (HUMIRA) 40 MG/0.4ML PSKT    Inject into the skin.   ALBUTEROL (VENTOLIN HFA) 108 (90 BASE) MCG/ACT INHALER    Inhale 1 puff into the lungs every 6 (six) hours as needed for wheezing or shortness of breath.   ASPIRIN 81 MG EC TABLET    Take 1 tablet by mouth daily.   CABOTEGRAVIR & RILPIVIRINE ER (CABENUVA) 600 & 900 MG/3ML INJECTION    Inject 1 kit into the muscle every 2 (two) months.   CETIRIZINE (ZYRTEC ALLERGY) 10 MG TABLET    Take 1 tablet (10 mg total) by mouth daily.   ESCITALOPRAM (LEXAPRO) 20 MG TABLET    Take 1 tablet (20 mg total) by mouth daily.   EZETIMIBE (ZETIA) 10 MG TABLET    Take 1 tablet (10 mg total) by mouth daily.   FLUTICASONE (FLOVENT HFA) 110 MCG/ACT INHALER    Inhale 2 puffs into the lungs 2 (two) times daily. Flovent 116mg to 4 puffs twice daily for TWO WEEKS   IBUPROFEN (ADVIL) 200 MG TABLET    Take 200 mg by mouth every 6 (six) hours as needed.   LOSARTAN (COZAAR) 50 MG TABLET    Take 1 tablet (50 mg total) by mouth daily.    MONTELUKAST (SINGULAIR) 10 MG TABLET    Take 1 tablet (10 mg total) by mouth at bedtime.   MULTIPLE VITAMINS-MINERALS (CENTRUM SILVER 50+MEN) TABS    Take by mouth. Three times per week   OLOPATADINE HCL 0.6 % SOLN    Place 2 sprays into the nose daily. two sprays per nostril 1-2 times daily as needed   TADALAFIL (CIALIS) 10 MG TABLET    Take 0.5 tablets (5 mg total) by mouth daily as needed for erectile dysfunction.   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[place]    Depression    Diverticulosis    Dyslipidemia    ED (erectile dysfunction)    GERD (gastroesophageal reflux disease)    History of colon polyps    HIV  infection ([place])    Hyperlipidemia    Hypertension    Internal hemorrhoids    Osteopenia    Shingles    Sleep apnea    does not wear a c-pap   Ulcerative colitis ([place])    Vitamin D deficiency     Social History: Social History   Socioeconomic History   Marital status: Divorced    Spouse name: Not on file   Number of children: 2   Years of education: Not on file   Highest education level: Not on file  Occupational History   Occupation: Financial planner    Comment: Disabled   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  Tobacco Use   Smoking status: Former    Types: Cigarettes    Quit date: 06/06/1987    Years since quitting: 34.2   Smokeless tobacco: Never   Tobacco comments:    quit 34 years ago  Vaping Use   Vaping Use: Never used  Substance and Sexual  Activity   Alcohol use: Yes    Comment: every other day -social   Drug use: Not Currently    Types: Marijuana    Comment: occassional usage - last smoked 3 months ago   Sexual activity: Yes    Partners: Male    Comment: declined condoms  Other Topics Concern   Not on file  Social History Narrative   Current Social History 05/16/2020        Patient lives with house mate in a two level townhome with handrails inside and 1-2 outside steps without handrails       Patient's method of transportation is personal car.      The highest level of education was 2 years college.      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 Ducatte, BSN, RN-BC       Social Determinants of Health   Financial Resource Strain: Low Risk  (05/27/2021)   Overall Financial Resource Strain (CARDIA)    Difficulty of Paying Living Expenses: Not hard at all  Food Insecurity: No Food Insecurity (08/20/2021)   Hunger Vital Sign    Worried About Running Out of Food in the Last Year: Never true    Ran Out of Food in the Last Year: Never true  Transportation Needs: No Transportation Needs (05/27/2021)   PRAPARE - Hydrologist (Medical): No    Lack of Transportation (Non-Medical): No  Physical Activity: Insufficiently Active (05/27/2021)   Exercise Vital Sign    Days of Exercise per Week: 2 days    Minutes of Exercise per Session: 60 min  Stress: No Stress Concern Present (05/27/2021)   [place]    Feeling of Stress :  Not at all  Social Connections: Unknown (05/27/2021)   Social Connection and Isolation Panel [NHANES]    Frequency of Communication with Friends and Family:  Twice a week    Frequency of Social Gatherings with Friends and Family: Not on file    Attends Religious Services: Never    Active Member of Clubs or Organizations: No    Attends Archivist Meetings: Never    Marital Status: Divorced    Labs: Lab Results  Component Value Date   HIV1RNAQUANT <20 (H) 07/16/2021   HIV1RNAQUANT Not Detected 03/11/2021   HIV1RNAQUANT Not Detected 09/17/2020   CD4TABS 962 09/17/2020   CD4TABS 1,261 04/09/2020   CD4TABS 1,097 08/18/2019    RPR and STI Lab Results  Component Value Date   LABRPR REACTIVE (A) 03/11/2021   LABRPR REACTIVE (A) 08/26/2020   LABRPR REACTIVE (A) 07/16/2020   LABRPR REACTIVE (A) 02/23/2020   LABRPR NON-REACTIVE 05/16/2019   RPRTITER 1:2 (H) 03/11/2021   RPRTITER 1:4 (H) 08/26/2020   RPRTITER 1:4 (H) 07/16/2020   RPRTITER 1:32 (H) 02/23/2020   RPRTITER 1:1 (H) 02/01/2019    STI Results GC CT  05/08/2021  2:03 PM Negative    Negative    Negative  Negative    Negative    Negative   08/26/2020  4:05 PM Negative    Positive    Positive  Negative    Negative    Negative   02/01/2019  2:30 PM Negative  Negative     Hepatitis B Lab Results  Component Value Date   HEPBSAB REACTIVE (A) 02/01/2019   HEPBSAG NON-REACTIVE 02/01/2019   HEPBCAB NON-REACTIVE 02/01/2019   Hepatitis C Lab Results  Component Value Date   HEPCAB NON-REACTIVE 02/01/2019   Hepatitis A Lab Results  Component Value Date   HAV REACTIVE (A) 02/01/2019   Lipids: Lab Results  Component Value Date   CHOL 261 (H) 03/11/2021   TRIG 370 (H) 03/11/2021   HDL 49 03/11/2021   CHOLHDL 5.3 (H) 03/11/2021   LDLCALC 155 (H) 03/11/2021    TARGET DATE: The 29th  Assessment: Robert Park presents today for his maintenance Cabenuva injections. Past injections were tolerated well without  issues.  Administered cabotegravir 637m/3mL in left upper outer quadrant of the gluteal muscle. Administered rilpivirine 900 mg/387min the right upper outer quadrant of the gluteal muscle. No issues with injections. He will follow up in 2 months for next set of injections.  Plan: - Cabenuva injections administered - Next injections scheduled for 11/11/21 and 01/09/22 with GrMarya Amsler Call with any issues or questions  Abbie Jablon L. Lenell Lama, PharmD, BCIDP, AAHIVP, CPBeale AFBlinical Pharmacist Practitioner InWatervilleor Infectious Disease

## 2021-10-02 DIAGNOSIS — M1812 Unilateral primary osteoarthritis of first carpometacarpal joint, left hand: Secondary | ICD-10-CM | POA: Diagnosis not present

## 2021-10-02 DIAGNOSIS — G5602 Carpal tunnel syndrome, left upper limb: Secondary | ICD-10-CM | POA: Diagnosis not present

## 2021-10-06 ENCOUNTER — Other Ambulatory Visit: Payer: Self-pay | Admitting: Family

## 2021-10-09 ENCOUNTER — Other Ambulatory Visit: Payer: Self-pay | Admitting: Allergy & Immunology

## 2021-10-09 ENCOUNTER — Other Ambulatory Visit: Payer: Self-pay | Admitting: Family

## 2021-10-09 NOTE — Telephone Encounter (Signed)
Please advise if okay to refill. 

## 2021-10-31 ENCOUNTER — Other Ambulatory Visit (HOSPITAL_COMMUNITY): Payer: Self-pay

## 2021-11-03 ENCOUNTER — Other Ambulatory Visit (HOSPITAL_COMMUNITY): Payer: Self-pay

## 2021-11-03 ENCOUNTER — Telehealth: Payer: Self-pay

## 2021-11-03 NOTE — Telephone Encounter (Signed)
[facility] Patient Advocate Encounter  Patient's medication Kern Reap) have been couriered to [facility] from [facility] and will be administered on the patient next office visit on 11/11/21.  [name] , [facility] Patient [facility] for [facility] Phone: [phone] Fax:  [phone]

## 2021-11-04 ENCOUNTER — Other Ambulatory Visit: Payer: Self-pay

## 2021-11-04 DIAGNOSIS — E785 Hyperlipidemia, unspecified: Secondary | ICD-10-CM

## 2021-11-04 DIAGNOSIS — F32A Depression, unspecified: Secondary | ICD-10-CM

## 2021-11-04 MED ORDER — LOSARTAN POTASSIUM 50 MG PO TABS: 50.0000 mg | ORAL_TABLET | Freq: Every day | ORAL | 3 refills | Status: DC

## 2021-11-04 MED ORDER — EZETIMIBE 10 MG PO TABS: 10.0000 mg | ORAL_TABLET | Freq: Every day | ORAL | 3 refills | Status: DC

## 2021-11-04 MED ORDER — ESCITALOPRAM OXALATE 20 MG PO TABS: 20.0000 mg | ORAL_TABLET | Freq: Every day | ORAL | 3 refills | Status: DC

## 2021-11-11 ENCOUNTER — Other Ambulatory Visit: Payer: Self-pay

## 2021-11-11 ENCOUNTER — Ambulatory Visit (INDEPENDENT_AMBULATORY_CARE_PROVIDER_SITE_OTHER): Payer: Medicare Other

## 2021-11-11 ENCOUNTER — Ambulatory Visit (INDEPENDENT_AMBULATORY_CARE_PROVIDER_SITE_OTHER): Payer: Medicare Other | Admitting: Family

## 2021-11-11 ENCOUNTER — Encounter: Payer: Self-pay | Admitting: Family

## 2021-11-11 VITALS — BP 146/88 | HR 69 | Temp 98.5°F | Resp 16 | Ht 67.0 in | Wt 227.0 lb

## 2021-11-11 DIAGNOSIS — B20 Human immunodeficiency virus [HIV] disease: Secondary | ICD-10-CM | POA: Diagnosis not present

## 2021-11-11 DIAGNOSIS — Z0000 Encounter for general adult medical examination without abnormal findings: Secondary | ICD-10-CM

## 2021-11-11 DIAGNOSIS — Z23 Encounter for immunization: Secondary | ICD-10-CM

## 2021-11-11 DIAGNOSIS — Z9189 Other specified personal risk factors, not elsewhere classified: Secondary | ICD-10-CM | POA: Diagnosis not present

## 2021-11-11 MED ORDER — CABOTEGRAVIR & RILPIVIRINE ER 600 & 900 MG/3ML IM SUER
1.0000 | Freq: Once | INTRAMUSCULAR | Status: AC
  Administered 2021-11-11: 1 via INTRAMUSCULAR

## 2021-11-11 NOTE — Assessment & Plan Note (Signed)
   Discussed importance of safe sexual practice and condom use. Condoms and STD testing offered.   Covid and high dose influenza vaccine update.

## 2021-11-11 NOTE — Patient Instructions (Addendum)
Nice to see you.  We will check your lab work today.  Plan for follow up in 2 months or sooner if needed.   Have a great day and stay safe!

## 2021-11-11 NOTE — Assessment & Plan Note (Signed)
Robert Park continues to have well controlled virus with good adherence and tolerance to [place]. Reviewed previous lab work and discussed plan of care. Injection of Cabenuva provided without complication. Check routine lab work today. Plan for follow up in 2 months or sooner if needed with lab work on the same day.

## 2021-11-11 NOTE — Progress Notes (Signed)
Brief Narrative   Patient ID: Robert Park, male    DOB: 02-03-1950, 71 y.o.   MRN: [id number]  Mr. Lamp is a 71 year old Caucasian gentleman diagnosed with HIV disease in the 2010 with risk factor of MSM.  Previous history of CMV colitis in 2010-2011.  Initial CD4 count of 450 with viral load of 21,000.  Entered care and CDC stage II.  [id number] negative. Previous medications include Atripla and Genovya.  Currently on Cabenuva.   Subjective:    Chief Complaint  Patient presents with   Follow-up    B20    HIV Positive/AIDS    HPI:  Robert Park is a 71 y.o. male with HIV disease last seen on 09/04/21 by Magda Kiel, PharmD, CPP with well controlled virus and good adherence and tolerance to q 2 month Cabenuva. Viral load was undetectable and CD4 count from February 2023 1,142. Here today for follow up and q 2 month injection of Cabenuva.   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 Tolerating Cabenuva with no adverse side effects. Soreness after injection lasting now a couple of hours. No new concerns/complaints. Condoms and STD testing offered. Healthcare maintenance due includes high dose influenza vaccine and Covid vaccine.   Denies fevers, chills, night sweats, headaches, changes in vision, neck pain/stiffness, nausea, diarrhea, vomiting, lesions or rashes.   Allergies  Allergen Reactions   Elemental Sulfur    Prednisone    Statins     Muscle pains, GI symptoms   Sulfamethoxazole-Trimethoprim       Outpatient Medications Prior to Visit  Medication Sig Dispense Refill   Adalimumab (HUMIRA) 40 MG/0.4ML PSKT Inject into the skin.     albuterol (VENTOLIN HFA) 108 (90 Base) MCG/ACT inhaler Inhale 1 puff into the lungs every 6 (six) hours as needed for wheezing or shortness of breath. 8 g 2   aspirin 81 MG EC tablet Take 1 tablet by mouth daily.     cabotegravir & rilpivirine ER (CABENUVA) 600 & 900 MG/3ML  injection Inject 1 kit into the muscle every 2 (two) months. 6 mL 5   cetirizine (ZYRTEC) 10 MG tablet TAKE 1 TABLET(10 MG) BY MOUTH DAILY 30 tablet 2   escitalopram (LEXAPRO) 20 MG tablet Take 1 tablet (20 mg total) by mouth daily. 90 tablet 3   ezetimibe (ZETIA) 10 MG tablet Take 1 tablet (10 mg total) by mouth daily. 90 tablet 3   fluticasone (FLOVENT HFA) 110 MCG/ACT inhaler Inhale 2 puffs into the lungs 2 (two) times daily. Flovent 124mg to 4 puffs twice daily for TWO WEEKS 1 each 5   ibuprofen (ADVIL) 200 MG tablet Take 200 mg by mouth every 6 (six) hours as needed.     losartan (COZAAR) 50 MG tablet Take 1 tablet (50 mg total) by mouth daily. 90 tablet 3   montelukast (SINGULAIR) 10 MG tablet Take 1 tablet (10 mg total) by mouth at bedtime. 30 tablet 5   Olopatadine HCl 0.6 % SOLN Place 2 sprays into the nose daily. two sprays per nostril 1-2 times daily as needed 30.5 g 5   tadalafil (CIALIS) 10 MG tablet Take 0.5 tablets (5 mg total) by mouth daily as needed for erectile dysfunction. 20 tablet 2   Vitamin D, Ergocalciferol, (DRISDOL) 1.25 MG (50000 UNIT) CAPS capsule TAKE 1 CAPSULE BY MOUTH EVERY 7 DAYS 4 capsule 5   Multiple Vitamins-Minerals (CENTRUM SILVER 50+MEN)  TABS Take by mouth. Three times per week     No facility-administered medications prior to visit.     Past Medical History:  Diagnosis Date   Allergy    Anxiety    Arthritis    Asthma    Cataract    Bilateral eyes   CMV colitis ([place])    Depression    Diverticulosis    Dyslipidemia    ED (erectile dysfunction)    GERD (gastroesophageal reflux disease)    History of colon polyps    HIV infection ([place])    Hyperlipidemia    Hypertension    Internal hemorrhoids    Osteopenia    Shingles    Sleep apnea    does not wear a c-pap   Ulcerative colitis ([place])    Vitamin D deficiency      Past Surgical History:  Procedure Laterality Date   CARPAL TUNNEL RELEASE Right    x 2   CARPAL TUNNEL RELEASE Left    x  2   CATARACT EXTRACTION, BILATERAL Bilateral 10/2017   COLONOSCOPY     HEMORRHOID SURGERY     MOUTH SURGERY     TONSILLECTOMY AND ADENOIDECTOMY     age 55   UPPER GASTROINTESTINAL ENDOSCOPY     VASECTOMY        Review of Systems  Constitutional:  Negative for appetite change, chills, fatigue, fever and unexpected weight change.  Eyes:  Negative for visual disturbance.  Respiratory:  Negative for cough, chest tightness, shortness of breath and wheezing.   Cardiovascular:  Negative for chest pain and leg swelling.  Gastrointestinal:  Negative for abdominal pain, constipation, diarrhea, nausea and vomiting.  Genitourinary:  Negative for dysuria, flank pain, frequency, genital sores, hematuria and urgency.  Skin:  Negative for rash.  Allergic/Immunologic: Negative for immunocompromised state.  Neurological:  Negative for dizziness and headaches.      Objective:    BP (!) 146/88 (BP Location: Right Arm)   Pulse 69   Temp 98.5 F (36.9 C)   Resp 16   Ht _0  (1.702 m)   Wt 227 lb (103 kg)   SpO2 95%   BMI 35.55 kg/m  Nursing note and vital signs reviewed.  Physical Exam Constitutional:      General: He is not in acute distress.    Appearance: He is well-developed.  Eyes:     Conjunctiva/sclera: Conjunctivae normal.  Cardiovascular:     Rate and Rhythm: Normal rate and regular rhythm.     Heart sounds: Normal heart sounds. No murmur heard.    No friction rub. No gallop.  Pulmonary:     Effort: Pulmonary effort is normal. No respiratory distress.     Breath sounds: Normal breath sounds. No wheezing or rales.  Chest:     Chest wall: No tenderness.  Abdominal:     General: Bowel sounds are normal.     Palpations: Abdomen is soft.     Tenderness: There is no abdominal tenderness.  Musculoskeletal:     Cervical back: Neck supple.  Lymphadenopathy:     Cervical: No cervical adenopathy.  Skin:    General: Skin is warm and dry.     Findings: No rash.  Neurological:      Mental Status: He is alert and oriented to person, place, and time.  Psychiatric:        Behavior: Behavior normal.        Thought Content: Thought content normal.  Judgment: Judgment normal.         11/11/2021   10:52 AM 05/27/2021    9:39 AM 03/11/2021   10:31 AM 10/21/2020   11:16 AM 09/17/2020   10:08 AM  Depression screen PHQ 2/9  Decreased Interest 0 0 0 0 0  Down, Depressed, Hopeless 0 0 0 0 0  PHQ - 2 Score 0 0 0 0 0       Assessment & Plan:    Patient Active Problem List   Diagnosis Date Noted   COVID-19 virus infection 11/21/2020   Mild persistent asthma, uncomplicated 79/02/4095   Seasonal and perennial allergic rhinitis 07/02/2020   Upper airway cough syndrome 06/25/2020   Allergic sinusitis 02/05/2020   Hypertension 08/07/2019   Prediabetes 08/07/2019   Hyperlipidemia 08/07/2019   GAD (generalized anxiety disorder) 08/07/2019   Localized osteoarthritis of left hand 08/07/2019   Colon polyps 06/06/2019   Human immunodeficiency virus (HIV) disease ([place]) 02/16/2019   Screening for STDs (sexually transmitted diseases) 02/16/2019   Osteopenia 02/16/2019   Healthcare maintenance 02/16/2019   Ulcerative colitis without complications ([place]) 35/32/9924     Problem List Items Addressed This Visit       Other   Human immunodeficiency virus (HIV) disease ([place]) - Primary (Chronic)    Mr. Weekly continues to have well controlled virus with good adherence and tolerance to [place]. Reviewed previous lab work and discussed plan of care. Injection of Cabenuva provided without complication. Check routine lab work today. Plan for follow up in 2 months or sooner if needed with lab work on the same day.       Relevant Orders   Comprehensive metabolic panel   HIV-1 RNA quant-no reflex-bld   T-helper cell (CD4)- ([facility] clinic only)   Healthcare maintenance (Chronic)    Discussed importance of safe sexual practice and condom use. Condoms and STD testing offered.   Covid and high dose influenza vaccine update.       Other Visit Diagnoses     At risk for osteoporosis       Relevant Orders   DG DXA FRACTURE ASSESSMENT        I am having Hoy Register "Bill" maintain his Humira, Centrum Silver 50+Men, ibuprofen, Olopatadine HCl, aspirin EC, tadalafil, cabotegravir & rilpivirine ER, albuterol, montelukast, fluticasone, Vitamin D (Ergocalciferol), cetirizine, escitalopram, ezetimibe, and losartan. We administered cabotegravir & rilpivirine ER.   Meds ordered this encounter  Medications   cabotegravir & rilpivirine ER (CABENUVA) 600 & 900 MG/3ML injection 1 kit     Follow-up: Return in about 2 months (around 01/11/2022), or if symptoms worsen or fail to improve.   Terri Piedra, MSN, FNP-C Nurse Practitioner [facility] [place] number: [phone]

## 2021-11-12 ENCOUNTER — Telehealth: Payer: Self-pay

## 2021-11-12 LAB — T-HELPER CELL (CD4) - (RCID CLINIC ONLY)
CD4 % Helper T Cell: 38 % (ref 33–65)
CD4 T Cell Abs: 757 /uL (ref 400–1790)

## 2021-11-12 MED ORDER — HUMIRA (2 SYRINGE) 40 MG/0.4ML ~~LOC~~ PSKT: 1.0000 "pen " | PREFILLED_SYRINGE | SUBCUTANEOUS | 11 refills | Status: AC

## 2021-11-12 NOTE — Telephone Encounter (Signed)
Received a fax from my [facility] with refill request for Humira 40 mg/0.4 ml every 2 weeks. Electronic RX sent to [facility].   Called and spoke with patient. He is due for routine follow up appt. Patient has been scheduled for a follow up appt with Dr. Loletha Carrow on Monday, 12/29/21 at 10:20 am. Pt is aware that his appt information will be available in [place]. Pt verbalized understanding and had no concerns at the end of the call.

## 2021-11-13 LAB — COMPREHENSIVE METABOLIC PANEL
AG Ratio: 2 (calc) (ref 1.0–2.5)
ALT: 21 U/L (ref 9–46)
AST: 18 U/L (ref 10–35)
Albumin: 4.7 g/dL (ref 3.6–5.1)
Alkaline phosphatase (APISO): 75 U/L (ref 35–144)
BUN: 16 mg/dL (ref 7–25)
CO2: 33 mmol/L — ABNORMAL HIGH (ref 20–32)
Calcium: 9.9 mg/dL (ref 8.6–10.3)
Chloride: 98 mmol/L (ref 98–110)
Creat: 0.86 mg/dL (ref 0.70–1.28)
Globulin: 2.4 g/dL (calc) (ref 1.9–3.7)
Glucose, Bld: 141 mg/dL — ABNORMAL HIGH (ref 65–99)
Potassium: 4.7 mmol/L (ref 3.5–5.3)
Sodium: 136 mmol/L (ref 135–146)
Total Bilirubin: 0.6 mg/dL (ref 0.2–1.2)
Total Protein: 7.1 g/dL (ref 6.1–8.1)

## 2021-11-13 LAB — HIV-1 RNA QUANT-NO REFLEX-BLD
HIV 1 RNA Quant: NOT DETECTED Copies/mL
HIV-1 RNA Quant, Log: NOT DETECTED Log cps/mL

## 2021-12-01 ENCOUNTER — Other Ambulatory Visit (HOSPITAL_BASED_OUTPATIENT_CLINIC_OR_DEPARTMENT_OTHER): Payer: Medicare Other

## 2021-12-01 ENCOUNTER — Encounter (HOSPITAL_BASED_OUTPATIENT_CLINIC_OR_DEPARTMENT_OTHER): Payer: Self-pay

## 2021-12-09 ENCOUNTER — Other Ambulatory Visit (HOSPITAL_BASED_OUTPATIENT_CLINIC_OR_DEPARTMENT_OTHER): Payer: Medicare Other

## 2021-12-10 ENCOUNTER — Other Ambulatory Visit (HOSPITAL_COMMUNITY): Payer: Self-pay

## 2021-12-29 ENCOUNTER — Ambulatory Visit: Payer: Medicare Other | Admitting: Gastroenterology

## 2021-12-30 ENCOUNTER — Other Ambulatory Visit: Payer: Self-pay

## 2021-12-30 ENCOUNTER — Other Ambulatory Visit (HOSPITAL_COMMUNITY): Payer: Self-pay

## 2021-12-30 ENCOUNTER — Ambulatory Visit: Payer: Medicare Other | Admitting: Allergy & Immunology

## 2021-12-31 ENCOUNTER — Telehealth: Payer: Self-pay

## 2021-12-31 NOTE — Telephone Encounter (Signed)
[facility] Patient Advocate Encounter  Patient's medications have been couriered to [facility] from [facility] , and will be administered on  01/09/2022.

## 2022-01-09 ENCOUNTER — Other Ambulatory Visit: Payer: Self-pay

## 2022-01-09 ENCOUNTER — Encounter: Payer: Self-pay | Admitting: Family

## 2022-01-09 ENCOUNTER — Ambulatory Visit (INDEPENDENT_AMBULATORY_CARE_PROVIDER_SITE_OTHER): Payer: Medicare Other | Admitting: Family

## 2022-01-09 VITALS — BP 148/90 | HR 73 | Temp 97.4°F | Ht 67.0 in | Wt 226.0 lb

## 2022-01-09 DIAGNOSIS — B20 Human immunodeficiency virus [HIV] disease: Secondary | ICD-10-CM

## 2022-01-09 DIAGNOSIS — Z0000 Encounter for general adult medical examination without abnormal findings: Secondary | ICD-10-CM

## 2022-01-09 MED ORDER — CABOTEGRAVIR & RILPIVIRINE ER 600 & 900 MG/3ML IM SUER
1.0000 | Freq: Once | INTRAMUSCULAR | Status: AC
  Administered 2022-01-09: 1 via INTRAMUSCULAR

## 2022-01-09 NOTE — Assessment & Plan Note (Signed)
Robert Park continues to have well controlled virus with good adherence and tolerance Cabenvua. Reviewed previous lab work and discussed plan of care. Check lab work today. Injection of Cabenuva provided without complication. Plan for follow up in 2 months or sooner if needed for next injection and with NP/MD in 6 months.

## 2022-01-09 NOTE — Assessment & Plan Note (Signed)
Discussed importance of safe sexual practice and condom use. Condoms and STD testing offered.  Vaccinations up to date. Awaiting bone mineral density scan.

## 2022-01-09 NOTE — Progress Notes (Signed)
Brief Narrative   Patient ID: Robert Park, male    DOB: [birth year], 71 y.o.   MRN: [id number]  Robert Park is a 71 year old Caucasian gentleman diagnosed with HIV disease in the 2010 with risk factor of MSM.  Previous history of CMV colitis in 2010-2011.  Initial CD4 count of 450 with viral load of 21,000.  Entered care and CDC stage II.  [id number] negative. Previous medications include Atripla and Genovya.  Currently on Cabenuva.    Subjective:    Chief Complaint  Patient presents with   Follow-up    HPI:  Robert Park is a 71 y.o. male with HIV disease last seen on 11/11/21 with well controlled virus and good tolerance to [place]. Viral load was undetectable and CD4 count 757. Kidney function, liver function and electrolytes within normal ranges. Here today for follow up and next injection.  Robert Park has been doing well since his last office visit and returned this week from a cruise and visiting his family. Conitnues to tolerate Cabenuva with no adverse side effects. Working some here and there. Awaiting appointment with [place] or his bone mineral density scan. Condoms and STD testing offered. Vaccinations are up to date.   Denies fevers, chills, night sweats, headaches, changes in vision, neck pain/stiffness, nausea, diarrhea, vomiting, lesions or rashes.   Allergies  Allergen Reactions   Elemental Sulfur    Prednisone    Statins     Muscle pains, GI symptoms   Sulfamethoxazole-Trimethoprim       Outpatient Medications Prior to Visit  Medication Sig Dispense Refill   Adalimumab (HUMIRA) 40 MG/0.4ML PSKT Inject 1 pen  into the skin every 14 (fourteen) days. 2 each 11   albuterol (VENTOLIN HFA) 108 (90 Base) MCG/ACT inhaler Inhale 1 puff into the lungs every 6 (six) hours as needed for wheezing or shortness of breath. 8 g 2   aspirin 81 MG EC tablet Take 1 tablet by mouth daily.     cabotegravir & rilpivirine ER (CABENUVA) 600 & 900 MG/3ML injection Inject 1 kit  into the muscle every 2 (two) months. 6 mL 5   cetirizine (ZYRTEC) 10 MG tablet TAKE 1 TABLET(10 MG) BY MOUTH DAILY 30 tablet 2   escitalopram (LEXAPRO) 20 MG tablet Take 1 tablet (20 mg total) by mouth daily. 90 tablet 3   ezetimibe (ZETIA) 10 MG tablet Take 1 tablet (10 mg total) by mouth daily. 90 tablet 3   fluticasone (FLOVENT HFA) 110 MCG/ACT inhaler Inhale 2 puffs into the lungs 2 (two) times daily. Flovent 111mg to 4 puffs twice daily for TWO WEEKS 1 each 5   ibuprofen (ADVIL) 200 MG tablet Take 200 mg by mouth every 6 (six) hours as needed.     losartan (COZAAR) 50 MG tablet Take 1 tablet (50 mg total) by mouth daily. 90 tablet 3   montelukast (SINGULAIR) 10 MG tablet Take 1 tablet (10 mg total) by mouth at bedtime. 30 tablet 5   Olopatadine HCl 0.6 % SOLN Place 2 sprays into the nose daily. two sprays per nostril 1-2 times daily as needed 30.5 g 5   tadalafil (CIALIS) 10 MG tablet Take 0.5 tablets (5 mg total) by mouth daily as needed for erectile dysfunction. 20 tablet 2   Vitamin D, Ergocalciferol, (DRISDOL) 1.25 MG (50000 UNIT) CAPS capsule TAKE 1 CAPSULE BY MOUTH EVERY 7 DAYS 4 capsule 5   No facility-administered medications prior to visit.     Past Medical History:  Diagnosis Date  Allergy    Anxiety    Arthritis    Asthma    Cataract    Bilateral eyes   CMV colitis ([place])    Depression    Diverticulosis    Dyslipidemia    ED (erectile dysfunction)    GERD (gastroesophageal reflux disease)    History of colon polyps    HIV infection ([place])    Hyperlipidemia    Hypertension    Internal hemorrhoids    Osteopenia    Shingles    Sleep apnea    does not wear a c-pap   Ulcerative colitis ([place])    Vitamin D deficiency      Past Surgical History:  Procedure Laterality Date   CARPAL TUNNEL RELEASE Right    x 2   CARPAL TUNNEL RELEASE Left    x 2   CATARACT EXTRACTION, BILATERAL Bilateral 10/2017   COLONOSCOPY     HEMORRHOID SURGERY     MOUTH SURGERY      TONSILLECTOMY AND ADENOIDECTOMY     age 22   UPPER GASTROINTESTINAL ENDOSCOPY     VASECTOMY        Review of Systems  Constitutional:  Negative for appetite change, chills, fatigue, fever and unexpected weight change.  Eyes:  Negative for visual disturbance.  Respiratory:  Negative for cough, chest tightness, shortness of breath and wheezing.   Cardiovascular:  Negative for chest pain and leg swelling.  Gastrointestinal:  Negative for abdominal pain, constipation, diarrhea, nausea and vomiting.  Genitourinary:  Negative for dysuria, flank pain, frequency, genital sores, hematuria and urgency.  Skin:  Negative for rash.  Allergic/Immunologic: Negative for immunocompromised state.  Neurological:  Negative for dizziness and headaches.      Objective:    BP (!) 148/90   Pulse 73   Temp (!) 97.4 F (36.3 C) (Temporal)   Ht _0  (1.702 m)   Wt 226 lb (102.5 kg)   SpO2 97%   BMI 35.40 kg/m  Nursing note and vital signs reviewed.  Physical Exam Constitutional:      General: He is not in acute distress.    Appearance: He is well-developed.  Eyes:     Conjunctiva/sclera: Conjunctivae normal.  Cardiovascular:     Rate and Rhythm: Normal rate and regular rhythm.     Heart sounds: Normal heart sounds. No murmur heard.    No friction rub. No gallop.  Pulmonary:     Effort: Pulmonary effort is normal. No respiratory distress.     Breath sounds: Normal breath sounds. No wheezing or rales.  Chest:     Chest wall: No tenderness.  Abdominal:     General: Bowel sounds are normal.     Palpations: Abdomen is soft.     Tenderness: There is no abdominal tenderness.  Musculoskeletal:     Cervical back: Neck supple.  Lymphadenopathy:     Cervical: No cervical adenopathy.  Skin:    General: Skin is warm and dry.     Findings: No rash.  Neurological:     Mental Status: He is alert and oriented to person, place, and time.  Psychiatric:        Behavior: Behavior normal.         Thought Content: Thought content normal.        Judgment: Judgment normal.         11/11/2021   10:52 AM 05/27/2021    9:39 AM 03/11/2021   10:31 AM 10/21/2020   11:16 AM 09/17/2020  10:08 AM  Depression screen PHQ 2/9  Decreased Interest 0 0 0 0 0  Down, Depressed, Hopeless 0 0 0 0 0  PHQ - 2 Score 0 0 0 0 0       Assessment & Plan:    Patient Active Problem List   Diagnosis Date Noted   COVID-19 virus infection 11/21/2020   Mild persistent asthma, uncomplicated 19/75/8832   Seasonal and perennial allergic rhinitis 07/02/2020   Upper airway cough syndrome 06/25/2020   Allergic sinusitis 02/05/2020   Hypertension 08/07/2019   Prediabetes 08/07/2019   Hyperlipidemia 08/07/2019   GAD (generalized anxiety disorder) 08/07/2019   Localized osteoarthritis of left hand 08/07/2019   Colon polyps 06/06/2019   Human immunodeficiency virus (HIV) disease ([place]) 02/16/2019   Screening for STDs (sexually transmitted diseases) 02/16/2019   Osteopenia 02/16/2019   Healthcare maintenance 02/16/2019   Ulcerative colitis without complications ([place]) 54/98/2641     Problem List Items Addressed This Visit       Other   Human immunodeficiency virus (HIV) disease ([place]) - Primary (Chronic)    Robert Park continues to have well controlled virus with good adherence and tolerance Cabenvua. Reviewed previous lab work and discussed plan of care. Check lab work today. Injection of Cabenuva provided without complication. Plan for follow up in 2 months or sooner if needed for next injection and with NP/MD in 6 months.       Relevant Orders   BASIC METABOLIC PANEL WITH GFR   HIV-1 RNA quant-no reflex-bld   T-helper cells (CD4) count (not at [facility])   Healthcare maintenance (Chronic)    Discussed importance of safe sexual practice and condom use. Condoms and STD testing offered.  Vaccinations up to date. Awaiting bone mineral density scan.         I am having Hoy Register "Robert Park" maintain his  ibuprofen, Olopatadine HCl, aspirin EC, tadalafil, cabotegravir & rilpivirine ER, albuterol, montelukast, fluticasone, Vitamin D (Ergocalciferol), cetirizine, escitalopram, ezetimibe, losartan, and Humira. We administered cabotegravir & rilpivirine ER.   Meds ordered this encounter  Medications   cabotegravir & rilpivirine ER (CABENUVA) 600 & 900 MG/3ML injection 1 kit     Follow-up: Return in about 2 months (around 03/12/2022), or if symptoms worsen or fail to improve.   Terri Piedra, MSN, FNP-C Nurse Practitioner [facility] for Infectious Disease [place] number: [phone]

## 2022-01-09 NOTE — Patient Instructions (Addendum)
Nice to see you.  We will check your lab work today.  Continue to take your medication daily as prescribed.  Plan for follow up in 2 months with Pharmacy and NP/MD in 6 months or sooner if needed with lab work on the same day.  Have a great day and stay safe!

## 2022-01-13 LAB — BASIC METABOLIC PANEL WITH GFR
BUN: 16 mg/dL (ref 7–25)
CO2: 25 mmol/L (ref 20–32)
Calcium: 10.6 mg/dL — ABNORMAL HIGH (ref 8.6–10.3)
Chloride: 102 mmol/L (ref 98–110)
Creat: 0.82 mg/dL (ref 0.70–1.28)
Glucose, Bld: 139 mg/dL — ABNORMAL HIGH (ref 65–99)
Potassium: 4.5 mmol/L (ref 3.5–5.3)
Sodium: 136 mmol/L (ref 135–146)
eGFR: 94 mL/min/{1.73_m2} (ref >=60)

## 2022-01-13 LAB — T-HELPER CELLS (CD4) COUNT (NOT AT ARMC)
Absolute CD4: 1021 cells/uL (ref 490–1740)
CD4 T Helper %: 41 % (ref 30–61)
Total lymphocyte count: 2477 cells/uL (ref 850–3900)

## 2022-01-13 LAB — HIV-1 RNA QUANT-NO REFLEX-BLD
HIV 1 RNA Quant: NOT DETECTED Copies/mL
HIV-1 RNA Quant, Log: NOT DETECTED Log cps/mL

## 2022-01-14 ENCOUNTER — Ambulatory Visit: Payer: Medicare Other | Admitting: Gastroenterology

## 2022-01-22 ENCOUNTER — Other Ambulatory Visit: Payer: Self-pay

## 2022-01-22 ENCOUNTER — Ambulatory Visit: Payer: Medicare Other | Admitting: Allergy & Immunology

## 2022-01-22 ENCOUNTER — Encounter: Payer: Self-pay | Admitting: Allergy & Immunology

## 2022-01-22 VITALS — BP 122/70 | HR 76 | Temp 97.8°F | Resp 20 | Ht 67.0 in | Wt 225.0 lb

## 2022-01-22 DIAGNOSIS — G4733 Obstructive sleep apnea (adult) (pediatric): Secondary | ICD-10-CM

## 2022-01-22 DIAGNOSIS — J4530 Mild persistent asthma, uncomplicated: Secondary | ICD-10-CM | POA: Diagnosis not present

## 2022-01-22 DIAGNOSIS — J3089 Other allergic rhinitis: Secondary | ICD-10-CM

## 2022-01-22 DIAGNOSIS — J302 Other seasonal allergic rhinitis: Secondary | ICD-10-CM

## 2022-01-22 MED ORDER — MONTELUKAST SODIUM 10 MG PO TABS: 10.0000 mg | ORAL_TABLET | Freq: Every day | ORAL | 5 refills | Status: DC

## 2022-01-22 MED ORDER — ALBUTEROL SULFATE HFA 108 (90 BASE) MCG/ACT IN AERS: 1.0000 | INHALATION_SPRAY | Freq: Four times a day (QID) | RESPIRATORY_TRACT | 2 refills | Status: DC | PRN

## 2022-01-22 MED ORDER — AZELASTINE HCL 0.1 % NA SOLN: 1.0000 | Freq: Two times a day (BID) | NASAL | 5 refills | Status: DC | PRN

## 2022-01-22 MED ORDER — FLUTICASONE PROPIONATE HFA 110 MCG/ACT IN AERO: 2.0000 | INHALATION_SPRAY | Freq: Two times a day (BID) | RESPIRATORY_TRACT | 5 refills | Status: DC

## 2022-01-22 NOTE — Progress Notes (Signed)
FOLLOW UP  Date of Service/Encounter:  01/22/22   Assessment:   Mild persistent asthma, uncomplicated - with much better control since starting the Flovent    Seasonal and perennial allergic rhinitis (grasses, ragweed, weeds, trees, cat, and dog)   Complicated past medical history including well-controlled HIV as well as ulcerative colitis  Plan/Recommendations:   1. Mild persistent asthma, uncomplicated - Lung testing looks amazing today! - I am not going to make any changes at all today.    - We will send in the generic one and see if your insurance covers it.  - We may need to change things around, but we will see how the coverage goes.  - Daily controller medication(s): Flovent 133mg 2 puffs twice daily with spacer - Prior to physical activity: albuterol 2 puffs 10-15 minutes before physical activity. - Rescue medications: albuterol 4 puffs every 4-6 hours as needed - Changes during respiratory infections or worsening symptoms: Increase Flovent 1160m to 4 puffs twice daily for TWO WEEKS. - Asthma control goals:  * Full participation in all desired activities (may need albuterol before activity) * Albuterol use two time or less a week on average (not counting use with activity) * Cough interfering with sleep two time or less a month * Oral steroids no more than once a year * No hospitalizations  2. Chronic rhinitis (grasses, ragweed, weeds, trees, cat, and dog) - It seems that everything is well controlled. - We are not going to make any changes at this time.  - Continue with: Zyrtec (cetirizine) '10mg'$  tablet 1-2 times daily and Flonase (fluticasone) two sprays per nostril daily - Continue with: Singulair (montelukast) '10mg'$  daily and Patanase (olopatadine) two sprays per nostril 1-2 times daily as needed - Singulair can cause irritability and depression, so be aware of that and stop it if it happens, however, most people tolerate it without a problem - You can use an  extra dose of the antihistamine, if needed, for breakthrough symptoms.  - Consider nasal saline rinses 1-2 times daily to remove allergens from the nasal cavities as well as help with mucous clearance (this is especially helpful to do before the nasal sprays are given) - Consider allergy shots as a means of long-term control.  3. Return in about 6 months (around 07/23/2022).    Subjective:   WiDelaney Park a 89.o. male presenting today for follow up of  Chief Complaint  Patient presents with   Follow-up   ASTHMA    PT STATES HE HAS BEEN DOING PRETTY GOOD NO CONCERNS AT THIS TIME.    WiMare Ludtkeas a history of the following: Patient Active Problem List   Diagnosis Date Noted   COVID-19 virus infection 11/21/2020   Mild persistent asthma, uncomplicated 0635/70/1779 Seasonal and perennial allergic rhinitis 07/02/2020   Upper airway cough syndrome 06/25/2020   Allergic sinusitis 02/05/2020   Hypertension 08/07/2019   Prediabetes 08/07/2019   Hyperlipidemia 08/07/2019   GAD (generalized anxiety disorder) 08/07/2019   Localized osteoarthritis of left hand 08/07/2019   Colon polyps 06/06/2019   Human immunodeficiency virus (HIV) disease ([facility]02/04/2019   Screening for STDs (sexually transmitted diseases) 02/16/2019   Osteopenia 02/16/2019   Healthcare maintenance 02/16/2019   Ulcerative colitis without complications ([facility]0239/03/90  History obtained from: chart review and patient.  WiGersons a [age].o. male presenting for a follow up visit.  He was last seen in June 2023.  At that time, his lung  testing looked amazing.  We continue with Flovent 110 mcg 2 puffs twice daily, increasing to 4 puffs twice daily during flares.  For his allergic rhinitis, we continue with Zyrtec as well as Flonase and Singulair and Patanase.  We did talk about allergy shots for long-term control.  Since last visit, he has done well. He went to [place] and had a great time. He went to the [place] and [place]. He went with his ex wife. They are traveling to [place] and [place] in April.   Asthma/Respiratory Symptom History: He remains on the Flovent two puffs twice daily. He is really not doing it all of the time. He mostly accidentally skips doses. He will start to cough which will remind him to take the medication. He is mostly does at least once daily with the Flovent.   He does have obstructive sleep apnea.  He is compliant with his CPAP.  Allergic Rhinitis Symptom History: Allergies are well controlled. He just moved into a new house. He lives near [facility]. He was previously in a town home area.  He is doing well with his current set of medications.  He uses them on a as needed basis  He continues to work for [employer]. He works for four hours per day max.   He has HIV and is on Cabenuva every two months. He has ulcerative colitis and used Humira every 2 weeks. He has been on this for 3 years and has not been in the hospital at all since then.  As well has been as well again no somewhat she was ohMs. He sees Dr. Loletha Carrow for management of his ulcerative colitis.  He is very happy with Dr. Loletha Carrow and credits him with saving his life.  Evidently, his old gastroenterologist down in [place] only wanted to remove his colon.  Otherwise, there have been no changes to his past medical history, surgical history, family history, or social history.    Review of Systems  Constitutional: Negative.  Negative for chills, fever, malaise/fatigue and weight loss.  HENT: Negative.  Negative for congestion, ear discharge, ear pain and sinus pain.   Eyes:  Negative for pain, discharge and redness.  Respiratory:  Negative for cough, sputum production, shortness of breath and wheezing.   Cardiovascular: Negative.  Negative for chest pain and palpitations.  Gastrointestinal:  Negative for abdominal pain, constipation, diarrhea, heartburn, nausea and vomiting.  Skin: Negative.   Negative for itching and rash.  Neurological:  Negative for dizziness and headaches.  Endo/Heme/Allergies:  Negative for environmental allergies. Does not bruise/bleed easily.  All other systems reviewed and are negative.      Objective:   Blood pressure 122/70, pulse 76, temperature 97.8 F (36.6 C), resp. rate 20, height '5\' 7"'$  (1.702 m), weight 225 lb (102.1 kg), SpO2 97 %. Body mass index is 35.24 kg/m.    Physical Exam Vitals reviewed.  Constitutional:      Appearance: He is well-developed.     Comments: Very lovely and talkative.  HENT:     Head: Normocephalic and atraumatic.     Right Ear: Tympanic membrane, ear canal and external ear normal.     Left Ear: Tympanic membrane, ear canal and external ear normal.     Nose: No nasal deformity, septal deviation, mucosal edema or rhinorrhea.     Right Turbinates: Enlarged, swollen and pale.     Left Turbinates: Enlarged, swollen and pale.     Right Sinus: No maxillary  sinus tenderness or frontal sinus tenderness.     Left Sinus: No maxillary sinus tenderness or frontal sinus tenderness.     Mouth/Throat:     Mouth: Mucous membranes are not pale and not dry.     Pharynx: Uvula midline.  Eyes:     General: Lids are normal. No allergic shiner.       Right eye: No discharge.        Left eye: No discharge.     Conjunctiva/sclera: Conjunctivae normal.     Right eye: Right conjunctiva is not injected. No chemosis.    Left eye: Left conjunctiva is not injected. No chemosis.    Pupils: Pupils are equal, round, and reactive to light.  Cardiovascular:     Rate and Rhythm: Normal rate and regular rhythm.     Heart sounds: Normal heart sounds.  Pulmonary:     Effort: Pulmonary effort is normal. No tachypnea, accessory muscle usage or respiratory distress.     Breath sounds: Normal breath sounds. No wheezing, rhonchi or rales.     Comments: Moving air well in all lung fields.  Chest:     Chest wall: No tenderness.   Lymphadenopathy:     Cervical: No cervical adenopathy.  Skin:    Coloration: Skin is not pale.     Findings: No abrasion, erythema, petechiae or rash. Rash is not papular, urticarial or vesicular.  Neurological:     Mental Status: He is alert.  Psychiatric:        Behavior: Behavior is cooperative.     Since Diagnostic studies:    Spirometry: results normal (FEV1: 2.99/104%, FVC: 3.42/87%, FEV1/FVC: 87%).    Spirometry consistent with normal pattern.    Allergy Studies: none        Salvatore Marvel, MD  [facility] [place] [facility] [place]

## 2022-01-22 NOTE — Addendum Note (Signed)
Addended by: Valentina Shaggy on: 01/22/2022 09:46 AM   Modules accepted: Orders

## 2022-01-22 NOTE — Patient Instructions (Addendum)
1. Mild persistent asthma, uncomplicated - Lung testing looks amazing today! - I am not going to make any changes at all today.    - We will send in the generic one and see if your insurance covers it.  - We may need to change things around, but we will see how the coverage goes.  - Daily controller medication(s): Flovent 129mg 2 puffs twice daily with spacer - Prior to physical activity: albuterol 2 puffs 10-15 minutes before physical activity. - Rescue medications: albuterol 4 puffs every 4-6 hours as needed - Changes during respiratory infections or worsening symptoms: Increase Flovent 1133m to 4 puffs twice daily for TWO WEEKS. - Asthma control goals:  * Full participation in all desired activities (may need albuterol before activity) * Albuterol use two time or less a week on average (not counting use with activity) * Cough interfering with sleep two time or less a month * Oral steroids no more than once a year * No hospitalizations  2. Chronic rhinitis (grasses, ragweed, weeds, trees, cat, and dog) - It seems that everything is well controlled. - We are not going to make any changes at this time.  - Continue with: Zyrtec (cetirizine) '10mg'$  tablet 1-2 times daily and Flonase (fluticasone) two sprays per nostril daily - Continue with: Singulair (montelukast) '10mg'$  daily and Patanase (olopatadine) two sprays per nostril 1-2 times daily as needed - Singulair can cause irritability and depression, so be aware of that and stop it if it happens, however, most people tolerate it without a problem - You can use an extra dose of the antihistamine, if needed, for breakthrough symptoms.  - Consider nasal saline rinses 1-2 times daily to remove allergens from the nasal cavities as well as help with mucous clearance (this is especially helpful to do before the nasal sprays are given) - Consider allergy shots as a means of long-term control.  3. Return in about 6 months (around 07/23/2022).     Please inform usKoreaf any Emergency Department visits, hospitalizations, or changes in symptoms. Call usKoreaefore going to the ED for breathing or allergy symptoms since we might be able to fit you in for a sick visit. Feel free to contact usKoreanytime with any questions, problems, or concerns.  It was a pleasure to see you again today!  Websites that have reliable patient information: 1. American Academy of Asthma, Allergy, and Immunology: www.aaaai.org 2. Food Allergy Research and Education (FARE): foodallergy.org 3. Mothers of Asthmatics: http://www.asthmacommunitynetwork.org 4. American College of Allergy, Asthma, and Immunology: www.acaai.org   COVID-19 Vaccine Information can be found at: [email or web address] questions related to vaccine distribution or appointments, please email [email or web address] or call [phone]  We realize that you might be concerned about having an allergic reaction to the COVID19 vaccines. To help with that concern, WE ARE OFFERING THE COVID19 VACCINES IN OUR OFFICE! Ask the front desk for dates!     "Like" usKorean Facebook and Instagram for our latest updates!      A healthy democracy works best when ALNew York Life Insurancearticipate! Make sure you are registered to vote! If you have moved or changed any of your contact information, you will need to get this updated before voting!  In some cases, you MAY be able to register to vote online: [email or web address]

## 2022-02-13 ENCOUNTER — Encounter: Payer: Self-pay | Admitting: Gastroenterology

## 2022-02-13 ENCOUNTER — Ambulatory Visit: Payer: Medicare Other | Admitting: Gastroenterology

## 2022-02-13 DIAGNOSIS — Z79899 Other long term (current) drug therapy: Secondary | ICD-10-CM

## 2022-02-13 DIAGNOSIS — K5130 Ulcerative (chronic) rectosigmoiditis without complications: Secondary | ICD-10-CM

## 2022-02-13 NOTE — Progress Notes (Signed)
Robert Park progress note:  History: Robert Park 02/13/2022  Referring provider: Axel Filler, MD  Reason for consult/chief complaint: uclerative rectosigmoiditis (No complaints today, Humira is working, just took a dose this morning)   Subjective  HPI: Summary of pertinent GI issues: Longstanding ulcerative colitis diagnosed 2009 under good control on Humira monotherapy (since about 2016). Surveillance colonoscopy January 2023 with no active colitis, no dysplasia on extensive biopsies.  (Markedly redundant colon causing challenging scope advancement) History of colon cancer in his mother. _______________  Robert Park is glad to report that his colitis seems under good control.  He will have an occasional episode of cramps and loose stool if he eats something that does not agree with him.  Denies rectal bleeding or chronic abdominal pain.  Denies dysphagia or odynophagia.  Takes Humira every other week without apparent side effect or injection reaction. Is up-to-date on vaccinations, including reportedly a shingles vaccine since I last saw him. Last bone density study August 2021-report reviewed, no osteopenia. He has had some scheduling difficulties getting another one done through his primary care provider.  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[place])    Depression    Diverticulosis    Dyslipidemia    ED (erectile dysfunction)    GERD (gastroesophageal reflux disease)    History of colon polyps    HIV infection ([place])    Hyperlipidemia    Hypertension    Internal hemorrhoids    Osteopenia    Shingles    Sleep apnea    does not wear a c-pap   Ulcerative colitis ([place])    Vitamin D deficiency      Past Surgical History: Past Surgical History:  Procedure Laterality Date   CARPAL TUNNEL RELEASE Right     x 2   CARPAL TUNNEL RELEASE Left    x 2   CATARACT EXTRACTION, BILATERAL Bilateral 10/2017   COLONOSCOPY     HEMORRHOID SURGERY     MOUTH SURGERY     TONSILLECTOMY AND ADENOIDECTOMY     age 58   UPPER GASTROINTESTINAL ENDOSCOPY     VASECTOMY       Family History: Family History  Problem Relation Age of Onset   Colon cancer Mother 3   Heart failure Mother    Cervical cancer Mother    Leukemia Mother    Diabetes Mother    Lung cancer Father    Diabetes Sister    Diverticulitis Brother    Diabetes Maternal Grandmother    Diabetes Brother    Hypertension Brother    Heart disease Brother    Hypertension Sister    Kidney disease Sister    Post-traumatic stress disorder Son    Hypertension Son    Hyperlipidemia Son    Anxiety disorder Son    Esophageal cancer Neg Hx    Rectal cancer Neg Hx    Stomach cancer Neg Hx     Social History: Social History   Socioeconomic History   Marital status: Divorced    Spouse name: Not on file   Number of children: 2   Years of education: Not on file   Highest education level: Not on file  Occupational History   Occupation: Financial planner    Comment: Disabled   Occupation:  Courier    Employer: [employer]    Comment: PT  Tobacco Use   Smoking status: Former    Types: Cigarettes    Quit date: 06/06/1987    Years since quitting: 34.7   Smokeless tobacco: Never   Tobacco comments:    quit 34 years ago  Vaping Use   Vaping Use: Never used  Substance and Sexual Activity   Alcohol use: Yes    Comment: every other day -social   Drug use: Not Currently    Types: Marijuana    Comment: occassional usage - last smoked 3 months ago   Sexual activity: Yes    Partners: Male    Comment: declined condoms  Other Topics Concern   Not on file  Social History Narrative   Current Social History 05/16/2020        Patient lives with house mate in a two level townhome with handrails inside and 1-2 outside steps without handrails        Patient's method of transportation is personal car.      The highest level of education was 2 years college.      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 Also, disabled from [place]       Identified important relationships are house mate, Cecile Hearing       Pets : 2 rescue cats from [place]       Interests / Fun: Going to gym, day trips ([place]), different restaurants       Current Stressors: Worry about son with PTSD (15 years service in [place] and [place])       Religious / Personal Beliefs: "A bit Spiritual."       L. [place], BSN, RN-BC       Social Determinants of Health   Financial Resource Strain: Low Risk  (05/27/2021)   Overall Financial Resource Strain (CARDIA)    Difficulty of Paying Living Expenses: Not hard at all  Food Insecurity: No Food Insecurity (08/20/2021)   Hunger Vital Sign    Worried About Running Out of Food in the Last Year: Never true    Ran Out of Food in the Last Year: Never true  Transportation Needs: No Transportation Needs (05/27/2021)   PRAPARE - Hydrologist (Medical): No    Lack of Transportation (Non-Medical): No  Physical Activity: Insufficiently Active (05/27/2021)   Exercise Vital Sign    Days of Exercise per Week: 2 days    Minutes of Exercise per Session: 60 min  Stress: No Stress Concern Present (05/27/2021)   [place]    Feeling of Stress : Not at all  Social Connections: Unknown (05/27/2021)   Social Connection and Isolation Panel [NHANES]    Frequency of Communication with Friends and Family: Twice a week    Frequency of Social Gatherings with Friends and Family: Not on file    Attends Religious Services: Never    Marine scientist or Organizations: No    Attends Archivist Meetings: Never    Marital Status: Divorced    Allergies: Allergies  Allergen Reactions   Elemental Sulfur     Prednisone    Statins     Muscle pains, GI symptoms   Sulfamethoxazole-Trimethoprim     Outpatient Meds: Current Outpatient Medications  Medication Sig Dispense Refill   Adalimumab (HUMIRA) 40 MG/0.4ML PSKT Inject 1 pen  into the skin every 14 (  fourteen) days. 2 each 11   albuterol (VENTOLIN HFA) 108 (90 Base) MCG/ACT inhaler Inhale 1 puff into the lungs every 6 (six) hours as needed for wheezing or shortness of breath. 8 g 2   aspirin 81 MG EC tablet Take 1 tablet by mouth daily.     cabotegravir & rilpivirine ER (CABENUVA) 600 & 900 MG/3ML injection Inject 1 kit into the muscle every 2 (two) months. 6 mL 5   cetirizine (ZYRTEC) 10 MG tablet TAKE 1 TABLET(10 MG) BY MOUTH DAILY 30 tablet 2   escitalopram (LEXAPRO) 20 MG tablet Take 1 tablet (20 mg total) by mouth daily. 90 tablet 3   ezetimibe (ZETIA) 10 MG tablet Take 1 tablet (10 mg total) by mouth daily. 90 tablet 3   fluticasone (FLOVENT HFA) 110 MCG/ACT inhaler Inhale 2 puffs into the lungs 2 (two) times daily. Flovent 163mg to 4 puffs twice daily for TWO WEEKS 1 each 5   ibuprofen (ADVIL) 200 MG tablet Take 200 mg by mouth every 6 (six) hours as needed.     losartan (COZAAR) 50 MG tablet Take 1 tablet (50 mg total) by mouth daily. 90 tablet 3   montelukast (SINGULAIR) 10 MG tablet Take 1 tablet (10 mg total) by mouth at bedtime. 30 tablet 5   tadalafil (CIALIS) 10 MG tablet Take 0.5 tablets (5 mg total) by mouth daily as needed for erectile dysfunction. 20 tablet 2   Vitamin D, Ergocalciferol, (DRISDOL) 1.25 MG (50000 UNIT) CAPS capsule TAKE 1 CAPSULE BY MOUTH EVERY 7 DAYS 4 capsule 5   No current facility-administered medications for this visit.      ___________________________________________________________________ Objective   Exam:  BP (!) 142/88 (BP Location: Left Arm, Patient Position: Sitting, Cuff Size: Normal)   Pulse 68   Ht '5\' 7"'$  (1.702 m)   Wt 225 lb 6 oz (102.2 kg)   BMI 35.30 kg/m  Wt Readings from Last  3 Encounters:  02/13/22 225 lb 6 oz (102.2 kg)  01/22/22 225 lb (102.1 kg)  01/09/22 226 lb (102.5 kg)    General: Well-appearing Eyes: sclera anicteric, no redness ENT: oral mucosa moist without lesions, no cervical or supraclavicular lymphadenopathy CV: Regular without appreciable murmur, no JVD, no peripheral edema Resp: clear to auscultation bilaterally, normal RR and effort noted GI: soft, obese, no tenderness, with active bowel sounds. No guarding or palpable organomegaly noted. Skin; warm and dry, no rash or jaundice noted Neuro: awake, alert and oriented x 3. Normal gross motor function and fluent speech  Data:  1. Surgical [P], colon 80cm biopsies BENIGN COLONIC MUCOSA WITH NO DIAGNOSTIC ABNORMALITY NEGATIVE FOR ACTIVITY AND CHANGES OF CHRONICITY 2. Surgical [P], colon 70cm BENIGN COLONIC MUCOSA WITH NO DIAGNOSTIC ABNORMALITY NEGATIVE FOR ACTIVITY AND CHANGES OF CHRONICITY 3. Surgical [P], colon 60cm BENIGN COLONIC MUCOSA WITH NO DIAGNOSTIC ABNORMALITY NEGATIVE FOR ACTIVITY AND CHANGES OF CHRONICITY 4. Surgical [P], colon 50 cm BENIGN COLONIC MUCOSA WITH NO DIAGNOSTIC ABNORMALITY NEGATIVE FOR ACTIVITY AND A CHANGES OF CHRONICITY 5. Surgical [P], colon 40cm EXTENSIVE WITH NO DIAGNOSTIC ABNORMALITY NEGATIVE FOR ACTIVITY AND CHANGES OF CHRONICITY [place] 6. Surgical [P], colon 30cm BENIGN COLONIC MUCOSA WITH NO DIAGNOSTIC ABNORMALITY NEGATIVE FOR ACTIVITY AND CHANGES OF CHRONICITY 7. Surgical [P], colon 20cm COLONIC MUCOSA WITH FOCAL MINIMAL ACTIVITY NEGATIVE FOR CHANGES OF CHRONICITY 8. Surgical [P], colon 10cm BENIGN COLONIC MUCOSA WITH NO DIAGNOSTIC ABNORMALITY NEGATIVE FOR ACTIVITY AND CHANGES OF CHRONICITY FTobin ChadMD Pathologist, Electronic Signature (Case signed 01/24/2021) __________________  Latest Ref Rng & Units 02/28/2020    9:06 PM 02/23/2020    9:38 AM 11/03/2019    2:14 PM  CBC  WBC 4.0 - 10.5 K/uL 12.6  7.7  10.0   Hemoglobin 13.0  - 17.0 g/dL 15.5  15.9  14.8   Hematocrit 39.0 - 52.0 % 46.3  45.9  44.4   Platelets 150 - 400 K/uL 295  246  365       Latest Ref Rng & Units 01/09/2022   11:23 AM 11/11/2021   11:33 AM 03/11/2021   11:14 AM  CMP  Glucose 65 - 99 mg/dL 139  141  134   BUN 7 - 25 mg/dL '16  16  10   '$ Creatinine 0.70 - 1.28 mg/dL 0.82  0.86  0.86   Sodium 135 - 146 mmol/L 136  136  137   Potassium 3.5 - 5.3 mmol/L 4.5  4.7  4.2   Chloride 98 - 110 mmol/L 102  98  99   CO2 20 - 32 mmol/L 25  33  29   Calcium 8.6 - 10.3 mg/dL 10.6  9.9  10.1   Total Protein 6.1 - 8.1 g/dL  7.1  6.9   Total Bilirubin 0.2 - 1.2 mg/dL  0.6  0.8   AST 10 - 35 U/L  18  20   ALT 9 - 46 U/L  21  30      Assessment: Encounter Diagnoses  Name Primary?   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  Stable HIV disease  Tolerating long-term immunosuppressive therapy well with Humira.  Up-to-date on vaccinations.  Hepatic function panel several months ago normal (PSC screening in UC patient)  Plan: DXA scan ordered  Continue Humira at current dose  See me in a year for surveillance colonoscopy or sooner if needed.  Thank you for the courtesy of this consult.  Please call me with any questions or concerns.  Nelida Meuse III  CC: Referring provider noted above

## 2022-02-13 NOTE — Patient Instructions (Signed)
_______________________________________________________  If your blood pressure at your visit was 140/90 or greater, please contact your primary care physician to follow up on this.  _______________________________________________________  If you are age 71 or older, your body mass index should be between 23-30. Your Body mass index is 35.3 kg/m. If this is out of the aforementioned range listed, please consider follow up with your Primary Care Provider. ________________________________________________________  The  GI providers would like to encourage you to use [facility] to communicate with providers for non-urgent requests or questions.  Due to long hold times on the telephone, sending your provider a message by [facility] may be a faster and more efficient way to get a response.  Please allow 48 business hours for a response.  Please remember that this is for non-urgent requests.  _______________________________________________________  Dennis Bast have been scheduled for a bone density test on 02-18-2022 at 9am. Please arrive 15 minutes prior to your scheduled appointment to radiology on the basement floor of [place] location for this test. If you need to cancel or reschedule for any reason, please contact radiology at [phone].  Preparation for test is as follows:  If you are taking calcium, discontinue this 24-48 hours prior to your appointment.  Wear pants with an elastic waistband (or without any metal such as a zipper).  Do not wear an underwire bra.  We do have gowns if you are unable to find appropriate clothing without metal.  Please bring a list of all current medications.  It was a pleasure to see you today!  Thank you for trusting me with your gastrointestinal care!

## 2022-02-18 ENCOUNTER — Ambulatory Visit (INDEPENDENT_AMBULATORY_CARE_PROVIDER_SITE_OTHER)
Admission: RE | Admit: 2022-02-18 | Discharge: 2022-02-18 | Disposition: A | Discharge status: Home / Self Care | Payer: Medicare Other | Source: Ambulatory Visit | Attending: Gastroenterology | Admitting: Gastroenterology

## 2022-02-18 ENCOUNTER — Other Ambulatory Visit (HOSPITAL_COMMUNITY): Payer: Self-pay

## 2022-02-18 DIAGNOSIS — M8589 Other specified disorders of bone density and structure, multiple sites: Secondary | ICD-10-CM

## 2022-02-18 DIAGNOSIS — K5130 Ulcerative (chronic) rectosigmoiditis without complications: Secondary | ICD-10-CM | POA: Diagnosis not present

## 2022-02-18 DIAGNOSIS — Z79899 Other long term (current) drug therapy: Secondary | ICD-10-CM | POA: Diagnosis not present

## 2022-02-20 ENCOUNTER — Other Ambulatory Visit (HOSPITAL_COMMUNITY): Payer: Self-pay

## 2022-02-20 DIAGNOSIS — M180 Bilateral primary osteoarthritis of first carpometacarpal joints: Secondary | ICD-10-CM | POA: Diagnosis not present

## 2022-02-20 DIAGNOSIS — M25511 Pain in right shoulder: Secondary | ICD-10-CM | POA: Diagnosis not present

## 2022-02-20 DIAGNOSIS — M1811 Unilateral primary osteoarthritis of first carpometacarpal joint, right hand: Secondary | ICD-10-CM | POA: Diagnosis not present

## 2022-02-23 ENCOUNTER — Other Ambulatory Visit: Payer: Self-pay

## 2022-02-24 ENCOUNTER — Telehealth: Payer: Self-pay

## 2022-02-24 NOTE — Telephone Encounter (Signed)
[facility] Patient Advocate Encounter  Patient's medication Kern Reap) have been couriered to [facility] from [facility] and will be administered on the patient next office visit on 03/12/22.  [name] , [facility] Patient [facility] for Infectious Disease Phone: [phone] Fax:  [phone]

## 2022-03-05 ENCOUNTER — Other Ambulatory Visit: Payer: Self-pay | Admitting: Allergy & Immunology

## 2022-03-08 NOTE — Telephone Encounter (Signed)
There is no 0.1% NASAL olopatadine on the market... there is a 0.1% ocular formulation, but I would not recommended putting eye drops in the nose.   Can we change to Astelin instead?   Salvatore Marvel, MD Allergy and [place] [facility] [place]

## 2022-03-11 ENCOUNTER — Other Ambulatory Visit: Payer: Self-pay | Admitting: Allergy & Immunology

## 2022-03-12 ENCOUNTER — Other Ambulatory Visit: Payer: Self-pay

## 2022-03-12 ENCOUNTER — Ambulatory Visit (INDEPENDENT_AMBULATORY_CARE_PROVIDER_SITE_OTHER): Payer: Medicare Other | Admitting: Pharmacist

## 2022-03-12 DIAGNOSIS — B20 Human immunodeficiency virus [HIV] disease: Secondary | ICD-10-CM | POA: Diagnosis not present

## 2022-03-12 MED ORDER — CABOTEGRAVIR & RILPIVIRINE ER 600 & 900 MG/3ML IM SUER
1.0000 | Freq: Once | INTRAMUSCULAR | Status: AC
  Administered 2022-03-12: 1 via INTRAMUSCULAR

## 2022-03-12 NOTE — Progress Notes (Signed)
HPI: Robert Park is a 72 y.o. male who presents to the [facility] clinic for [place] administration.  Patient Active Problem List   Diagnosis Date Noted   COVID-19 virus infection 11/21/2020   Mild persistent asthma, uncomplicated [id number]   Seasonal and perennial allergic rhinitis 07/02/2020   Upper airway cough syndrome 06/25/2020   Allergic sinusitis 02/05/2020   Hypertension 08/07/2019   Prediabetes 08/07/2019   Hyperlipidemia 08/07/2019   GAD (generalized anxiety disorder) 08/07/2019   Localized osteoarthritis of left hand 08/07/2019   Colon polyps 06/06/2019   Human immunodeficiency virus (HIV) disease ([place]) 02/16/2019   Screening for STDs (sexually transmitted diseases) 02/16/2019   Osteopenia 02/16/2019   Healthcare maintenance 02/16/2019   Ulcerative colitis without complications ([place]) [id number]    Patient's Medications  New Prescriptions   No medications on file  Previous Medications   ADALIMUMAB (HUMIRA) 40 MG/0.4ML PSKT    Inject 1 pen  into the skin every 14 (fourteen) days.   ALBUTEROL (VENTOLIN HFA) 108 (90 BASE) MCG/ACT INHALER    Inhale 1 puff into the lungs every 6 (six) hours as needed for wheezing or shortness of breath.   ASPIRIN 81 MG EC TABLET    Take 1 tablet by mouth daily.   CABOTEGRAVIR & RILPIVIRINE ER (CABENUVA) 600 & 900 MG/3ML INJECTION    Inject 1 kit into the muscle every 2 (two) months.   CETIRIZINE (ZYRTEC) 10 MG TABLET    TAKE 1 TABLET(10 MG) BY MOUTH DAILY   ESCITALOPRAM (LEXAPRO) 20 MG TABLET    Take 1 tablet (20 mg total) by mouth daily.   EZETIMIBE (ZETIA) 10 MG TABLET    Take 1 tablet (10 mg total) by mouth daily.   FLUTICASONE (FLOVENT HFA) 110 MCG/ACT INHALER    Inhale 2 puffs into the lungs 2 (two) times daily. Flovent 145mg to 4 puffs twice daily for TWO WEEKS   IBUPROFEN (ADVIL) 200 MG TABLET    Take 200 mg by mouth every 6 (six) hours as needed.   LOSARTAN (COZAAR) 50 MG TABLET    Take 1 tablet (50 mg total) by mouth  daily.   MONTELUKAST (SINGULAIR) 10 MG TABLET    Take 1 tablet (10 mg total) by mouth at bedtime.   OLOPATADINE HCL 0.6 % SOLN    SPRAY 2 SPRAYS IN EACH NOSTRIL ONCE TO TWICE DAILY   TADALAFIL (CIALIS) 10 MG TABLET    Take 0.5 tablets (5 mg total) by mouth daily as needed for erectile dysfunction.   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[place]    Depression    Diverticulosis    Dyslipidemia    ED (erectile dysfunction)    GERD (gastroesophageal reflux disease)    History of colon polyps    HIV infection ([place]    Hyperlipidemia    Hypertension    Internal hemorrhoids    Osteopenia  Shingles    Sleep apnea    does not wear a c-pap   Ulcerative colitis ([place])    Vitamin D deficiency     Social History: Social History   Socioeconomic History   Marital status: Divorced    Spouse name: Not on file   Number of children: 2   Years of education: Not on file   Highest education level: Not on file  Occupational History   Occupation: Financial planner    Comment: Disabled   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  Tobacco Use   Smoking status: Former    Types: Cigarettes    Quit date: 06/06/1987    Years since quitting: 34.7   Smokeless tobacco: Never   Tobacco comments:    quit 34 years ago  Vaping Use   Vaping Use: Never used  Substance and Sexual Activity   Alcohol use: Yes    Comment: every other day -social   Drug use: Not Currently    Types: Marijuana    Comment:  occassional usage - last smoked 3 months ago   Sexual activity: Yes    Partners: Male    Comment: declined condoms  Other Topics Concern   Not on file  Social History Narrative   Current Social History 05/16/2020        Patient lives with house mate in a two level townhome with handrails inside and 1-2 outside steps without handrails       Patient's method of transportation is personal car.      The highest level of education was 2 years college.      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 Also, disabled from [place]       Identified important relationships are house mate, Cecile Hearing       Pets : 2 rescue cats from [place]       Interests / Fun: Going to gym, day trips ([place]), different restaurants       Current Stressors: Worry about son with PTSD (15 years service in [place] and [place])       Religious / Personal Beliefs: "A bit Spiritual."       L. [place], BSN, RN-BC       Social Determinants of Health   Financial Resource Strain: Low Risk  (05/27/2021)   Overall Financial Resource Strain (CARDIA)    Difficulty of Paying Living Expenses: Not hard at all  Food Insecurity: No Food Insecurity (08/20/2021)   Hunger Vital Sign    Worried About Running Out of Food in the Last Year: Never true    Ran Out of Food in the Last Year: Never true  Transportation Needs: No Transportation Needs (05/27/2021)   PRAPARE - Hydrologist (Medical): No    Lack of Transportation (Non-Medical): No  Physical Activity: Insufficiently Active (05/27/2021)   Exercise Vital Sign    Days of Exercise per Week: 2 days    Minutes of Exercise per Session: 60 min  Stress: No Stress Concern Present (05/27/2021)   [place]    Feeling of Stress : Not at all  Social Connections: Unknown (05/27/2021)   Social Connection and Isolation Panel [NHANES]    Frequency of  Communication with Friends and Family: Twice a week    Frequency of Social Gatherings with Friends and Family: Not on file    Attends Religious  Services: Never    Active Member of Clubs or Organizations: No    Attends Archivist Meetings: Never    Marital Status: Divorced    Labs: Lab Results  Component Value Date   HIV1RNAQUANT Not Detected 01/09/2022   HIV1RNAQUANT Not Detected 11/11/2021   HIV1RNAQUANT <20 (H) 07/16/2021   CD4TABS 757 11/11/2021   CD4TABS 962 09/17/2020   CD4TABS 1,261 04/09/2020    RPR and STI Lab Results  Component Value Date   LABRPR REACTIVE (A) 03/11/2021   LABRPR REACTIVE (A) 08/26/2020   LABRPR REACTIVE (A) 07/16/2020   LABRPR REACTIVE (A) 02/23/2020   LABRPR NON-REACTIVE 05/16/2019   RPRTITER 1:2 (H) 03/11/2021   RPRTITER 1:4 (H) 08/26/2020   RPRTITER 1:4 (H) 07/16/2020   RPRTITER 1:32 (H) 02/23/2020   RPRTITER 1:1 (H) 02/01/2019    STI Results GC CT  05/08/2021  2:03 PM Negative    Negative    Negative  Negative    Negative    Negative   08/26/2020  4:05 PM Negative    Positive    Positive  Negative    Negative    Negative   02/01/2019  2:30 PM Negative  Negative     Hepatitis B Lab Results  Component Value Date   HEPBSAB REACTIVE (A) 02/01/2019   HEPBSAG NON-REACTIVE 02/01/2019   HEPBCAB NON-REACTIVE 02/01/2019   Hepatitis C Lab Results  Component Value Date   HEPCAB NON-REACTIVE 02/01/2019   Hepatitis A Lab Results  Component Value Date   HAV REACTIVE (A) 02/01/2019   Lipids: Lab Results  Component Value Date   CHOL 261 (H) 03/11/2021   TRIG 370 (H) 03/11/2021   HDL 49 03/11/2021   CHOLHDL 5.3 (H) 03/11/2021   LDLCALC 155 (H) 03/11/2021    TARGET DATE:  The 29th of the month  Current HIV Regimen: Cabenuva  Assessment: Robert Park presents today for their maintenance Cabenuva injections. Initial/past injections were tolerated well without issues. No problems with systemic effects of injections.    Administered cabotegravir '600mg'$ /79m in left upper outer quadrant of the gluteal muscle. Administered rilpivirine 900 mg/360min the right upper outer quadrant of the gluteal muscle. Monitored patient for 10 minutes after injection. Injections were tolerated well without issue. Patient will follow up in 2 months for next injection.  Plan: - Cabenuva injections administered - Next injections scheduled for 4/26 with me  - Call with any issues or questions  AmAlfonse SprucePharmD, CPP, BCIDP, AAStanfordlinical Pharmacist Practitioner [facility]

## 2022-03-16 ENCOUNTER — Telehealth: Payer: Self-pay | Admitting: Allergy & Immunology

## 2022-03-16 NOTE — Telephone Encounter (Signed)
Due to Flovent being discontinued, patients INS is covering Arnuity, Qvar, Advair, and Symbicort. Patient is needing a refill sent in to [facility] on [address]. His MCR Part D no longer covers OTC Generic Zyrtec so he is requesting a cheaper alternative prescription or a cheaper OTC option that he can buy.

## 2022-03-17 ENCOUNTER — Other Ambulatory Visit: Payer: Self-pay | Admitting: *Deleted

## 2022-03-17 MED ORDER — QVAR REDIHALER 80 MCG/ACT IN AERB: 2.0000 | INHALATION_SPRAY | Freq: Two times a day (BID) | RESPIRATORY_TRACT | 5 refills | Status: DC

## 2022-03-17 NOTE — Telephone Encounter (Signed)
Let's do Qvar 43mg two puffs BID.

## 2022-03-17 NOTE — Telephone Encounter (Signed)
New QVAR prescription has been sent in. Called and left a voicemail asking for patient to return call to inform. Also need to inform that he can try getting generic Zyrtec (Ceterizine) over the counter to see if that is more affordable for him.

## 2022-03-18 ENCOUNTER — Other Ambulatory Visit: Payer: Self-pay | Admitting: *Deleted

## 2022-03-18 NOTE — Telephone Encounter (Signed)
So it appears that the Arnuity is Tier 3 like QVAR which may be the same prince, and generic Flovent, Advair, and Symbicort are tier 4 which is more expensive than the QVAR would be. Do you want me to try and send in [place] to see what insurance would say, are there any other inhalers you would like for me to try and look at for coverage?

## 2022-03-18 NOTE — Telephone Encounter (Signed)
Called and spoke with the patient and advised of the QVAR and Cetirizine. He advised that the QVAR is still $75.00. I advised I'll look at the other inhalers and see if the coverage may be different and speak with Dr. Ernst Bowler. Patient verbalized understanding.

## 2022-03-20 ENCOUNTER — Other Ambulatory Visit: Payer: Self-pay | Admitting: *Deleted

## 2022-03-20 MED ORDER — ARNUITY ELLIPTA 100 MCG/ACT IN AEPB: 1.0000 | INHALATION_SPRAY | Freq: Every day | RESPIRATORY_TRACT | 5 refills | Status: DC

## 2022-03-20 NOTE — Telephone Encounter (Signed)
New prescription has been sent in, I called the patient to inform and a friend answered and stated that he was at work, I advised that I would call back Monday, the friend verbalized understanding and stated that he would let him know.

## 2022-03-20 NOTE — Telephone Encounter (Signed)
I think we decided to try Arnuity 120mg one puff once daily.   JSalvatore Marvel MD Allergy and [facility] [place]

## 2022-03-24 NOTE — Telephone Encounter (Signed)
Called and left a voicemail asking for patient to return call to inform of inhaler.

## 2022-03-27 NOTE — Telephone Encounter (Signed)
Called and informed patient of the medication change. Patient was advised to call or office if he encounters any issues picking up the [place]. Patient was also advised on administration.

## 2022-03-30 ENCOUNTER — Other Ambulatory Visit: Payer: Self-pay | Admitting: Family

## 2022-03-30 NOTE — Telephone Encounter (Signed)
Please advise on refill request

## 2022-04-21 DIAGNOSIS — M7541 Impingement syndrome of right shoulder: Secondary | ICD-10-CM | POA: Diagnosis not present

## 2022-04-21 DIAGNOSIS — M5030 Other cervical disc degeneration, unspecified cervical region: Secondary | ICD-10-CM | POA: Diagnosis not present

## 2022-04-21 DIAGNOSIS — M47812 Spondylosis without myelopathy or radiculopathy, cervical region: Secondary | ICD-10-CM | POA: Diagnosis not present

## 2022-04-24 ENCOUNTER — Other Ambulatory Visit: Payer: Self-pay

## 2022-04-24 ENCOUNTER — Other Ambulatory Visit: Payer: Self-pay | Admitting: Pharmacist

## 2022-04-24 ENCOUNTER — Other Ambulatory Visit (HOSPITAL_COMMUNITY): Payer: Self-pay

## 2022-04-24 DIAGNOSIS — B20 Human immunodeficiency virus [HIV] disease: Secondary | ICD-10-CM

## 2022-04-24 MED ORDER — CABOTEGRAVIR & RILPIVIRINE ER 600 & 900 MG/3ML IM SUER
1.0000 | INTRAMUSCULAR | 5 refills | Status: DC
  Filled 2022-04-24 – 2022-05-07 (×4): qty 6, 60d supply, fill #0
  Filled 2022-06-29: qty 6, 60d supply, fill #1
  Filled 2022-09-03: qty 6, 60d supply, fill #2
  Filled 2022-10-23: qty 6, 60d supply, fill #3
  Filled 2022-12-24: qty 6, 60d supply, fill #4
  Filled 2023-02-22: qty 6, 60d supply, fill #5

## 2022-05-04 ENCOUNTER — Telehealth: Payer: Self-pay

## 2022-05-04 ENCOUNTER — Telehealth: Payer: Medicare Other | Admitting: Student

## 2022-05-04 ENCOUNTER — Telehealth: Payer: Medicare Other | Admitting: Physician Assistant

## 2022-05-04 DIAGNOSIS — B9689 Other specified bacterial agents as the cause of diseases classified elsewhere: Secondary | ICD-10-CM | POA: Diagnosis not present

## 2022-05-04 DIAGNOSIS — J019 Acute sinusitis, unspecified: Secondary | ICD-10-CM

## 2022-05-04 MED ORDER — AZITHROMYCIN 250 MG PO TABS: ORAL_TABLET | ORAL | 0 refills | Status: AC

## 2022-05-04 NOTE — Progress Notes (Signed)
Virtual Visit Consent   Robert Park, you are scheduled for a virtual visit with a [facility] provider today. Just as with appointments in the office, your consent must be obtained to participate. Your consent will be active for this visit and any virtual visit you may have with one of our providers in the next 365 days. If you have a MyChart account, a copy of this consent can be sent to you electronically.  As this is a virtual visit, video technology does not allow for your provider to perform a traditional examination. This may limit your provider's ability to fully assess your condition. If your provider identifies any concerns that need to be evaluated in person or the need to arrange testing (such as labs, EKG, etc.), we will make arrangements to do so. Although advances in technology are sophisticated, we cannot ensure that it will always work on either your end or our end. If the connection with a video visit is poor, the visit may have to be switched to a telephone visit. With either a video or telephone visit, we are not always able to ensure that we have a secure connection.  By engaging in this virtual visit, you consent to the provision of healthcare and authorize for your insurance to be billed (if applicable) for the services provided during this visit. Depending on your insurance coverage, you may receive a charge related to this service.  I need to obtain your verbal consent now. Are you willing to proceed with your visit today? Robert Park has provided verbal consent on 05/04/2022 for a virtual visit (video or telephone). Margaretann Loveless, PA-C  Date: 05/04/2022 3:00 PM  Virtual Visit via Video Note   I, Margaretann Loveless, connected with  Robert Park  ([id number], 05/20/1950) on 05/04/22 at  3:00 PM EDT by a video-enabled telemedicine application and verified that I am speaking with the correct person using two identifiers.  Location: Patient: Virtual Visit Location  Patient: Home Provider: Virtual Visit Location Provider: Home Office   I discussed the limitations of evaluation and management by telemedicine and the availability of in person appointments. The patient expressed understanding and agreed to proceed.    History of Present Illness: Robert Park is a 72 y.o. who identifies as a male who was assigned male at birth, and is being seen today for possible sinus infection.  HPI: Sinusitis This is a new problem. The current episode started in the past 7 days. The problem has been gradually worsening since onset. Maximum temperature: subjective fevers. The fever has been present for 1 to 2 days. Associated symptoms include chills, congestion, coughing, headaches, sinus pressure and a sore throat. Pertinent negatives include no ear pain, hoarse voice, shortness of breath or sneezing. (Muffled hearing) Treatments tried: daily allergy medications, montelukast, sinutab. The treatment provided no relief.   Negative at home Covid 19 testing   Problems:  Patient Active Problem List   Diagnosis Date Noted   COVID-19 virus infection 11/21/2020   Mild persistent asthma, uncomplicated 07/02/2020   Seasonal and perennial allergic rhinitis 07/02/2020   Upper airway cough syndrome 06/25/2020   Allergic sinusitis 02/05/2020   Hypertension 08/07/2019   Prediabetes 08/07/2019   Hyperlipidemia 08/07/2019   GAD (generalized anxiety disorder) 08/07/2019   Localized osteoarthritis of left hand 08/07/2019   Colon polyps 06/06/2019   Human immunodeficiency virus (HIV) disease 02/16/2019   Screening for STDs (sexually transmitted diseases) 02/16/2019   Osteopenia 02/16/2019   Healthcare maintenance 02/16/2019  Ulcerative colitis without complications 02/16/2019    Allergies:  Allergies  Allergen Reactions   Elemental Sulfur    Prednisone    Statins     Muscle pains, GI symptoms   Sulfamethoxazole-Trimethoprim    Medications:  Current Outpatient  Medications:    azithromycin (ZITHROMAX) 250 MG tablet, Take 2 tablets on day 1, then 1 tablet daily on days 2 through 5, Disp: 6 tablet, Rfl: 0   Adalimumab (HUMIRA) 40 MG/0.4ML PSKT, Inject 1 pen  into the skin every 14 (fourteen) days., Disp: 2 each, Rfl: 11   albuterol (VENTOLIN HFA) 108 (90 Base) MCG/ACT inhaler, Inhale 1 puff into the lungs every 6 (six) hours as needed for wheezing or shortness of breath., Disp: 8 g, Rfl: 2   ARNUITY ELLIPTA 100 MCG/ACT AEPB, Inhale 1 puff into the lungs daily., Disp: 30 each, Rfl: 5   aspirin 81 MG EC tablet, Take 1 tablet by mouth daily., Disp: , Rfl:    cabotegravir & rilpivirine ER (CABENUVA) 600 & 900 MG/3ML injection, Inject 1 kit into the muscle every 2 (two) months., Disp: 6 mL, Rfl: 5   cetirizine (ZYRTEC) 10 MG tablet, TAKE 1 TABLET(10 MG) BY MOUTH DAILY, Disp: 90 tablet, Rfl: 1   escitalopram (LEXAPRO) 20 MG tablet, Take 1 tablet (20 mg total) by mouth daily., Disp: 90 tablet, Rfl: 3   ezetimibe (ZETIA) 10 MG tablet, Take 1 tablet (10 mg total) by mouth daily., Disp: 90 tablet, Rfl: 3   fluticasone (FLOVENT HFA) 110 MCG/ACT inhaler, Inhale 2 puffs into the lungs 2 (two) times daily. Flovent to 4 puffs twice daily for TWO WEEKS, Disp: 1 each, Rfl: 5   ibuprofen (ADVIL) 200 MG tablet, Take 200 mg by mouth every 6 (six) hours as needed., Disp: , Rfl:    losartan (COZAAR) 50 MG tablet, Take 1 tablet (50 mg total) by mouth daily., Disp: 90 tablet, Rfl: 3   montelukast (SINGULAIR) 10 MG tablet, Take 1 tablet (10 mg total) by mouth at bedtime., Disp: 30 tablet, Rfl: 5   Olopatadine HCl 0.6 % SOLN, SPRAY 2 SPRAYS IN EACH NOSTRIL ONCE TO TWICE DAILY, Disp: 30.5 g, Rfl: 5   QVAR REDIHALER 80 MCG/ACT inhaler, Inhale 2 puffs into the lungs 2 (two) times daily., Disp: 10.6 g, Rfl: 5   tadalafil (CIALIS) 10 MG tablet, Take 0.5 tablets (5 mg total) by mouth daily as needed for erectile dysfunction., Disp: 20 tablet, Rfl: 2   Vitamin D, Ergocalciferol,  (DRISDOL) 1.25 MG (50000 UNIT) CAPS capsule, TAKE 1 CAPSULE BY MOUTH EVERY 7 DAYS, Disp: 4 capsule, Rfl: 5  Observations/Objective: Patient is well-developed, well-nourished in no acute distress.  Resting comfortably at home.  Head is normocephalic, atraumatic.  No labored breathing.  Speech is clear and coherent with logical content.  Patient is alert and oriented at baseline.    Assessment and Plan: 1. Acute bacterial sinusitis - azithromycin (ZITHROMAX) 250 MG tablet; Take 2 tablets on day 1, then 1 tablet daily on days 2 through 5  Dispense: 6 tablet; Refill: 0  - Worsening symptoms that have not responded to OTC medications.  - Will give Azithromycin - Continue allergy medications.  - Steam and humidifier can help - Stay well hydrated and get plenty of rest.  - Seek in person evaluation if no symptom improvement or if symptoms worsen   Follow Up Instructions: I discussed the assessment and treatment plan with the patient. The patient was provided an opportunity to ask questions  and all were answered. The patient agreed with the plan and demonstrated an understanding of the instructions.  A copy of instructions were sent to the patient via MyChart unless otherwise noted below.    The patient was advised to call back or seek an in-person evaluation if the symptoms worsen or if the condition fails to improve as anticipated.  Time:  I spent 8 minutes with the patient via telehealth technology discussing the above problems/concerns.    Margaretann Loveless, PA-C

## 2022-05-04 NOTE — Patient Instructions (Signed)
Robert Park, thank you for joining Margaretann Loveless, PA-C for today's virtual visit.  While this provider is not your primary care provider (PCP), if your PCP is located in our provider database this encounter information will be shared with them immediately following your visit.   A [facility] MyChart account gives you access to today's visit and all your visits, tests, and labs performed at [facility] " click here if you don't have a [facility] MyChart account or go to [email or web address]  Consent: (Patient) Robert Park provided verbal consent for this virtual visit at the beginning of the encounter.  Current Medications:  Current Outpatient Medications:    azithromycin (ZITHROMAX) 250 MG tablet, Take 2 tablets on day 1, then 1 tablet daily on days 2 through 5, Disp: 6 tablet, Rfl: 0   Adalimumab (HUMIRA) 40 MG/0.4ML PSKT, Inject 1 pen  into the skin every 14 (fourteen) days., Disp: 2 each, Rfl: 11   albuterol (VENTOLIN HFA) 108 (90 Base) MCG/ACT inhaler, Inhale 1 puff into the lungs every 6 (six) hours as needed for wheezing or shortness of breath., Disp: 8 g, Rfl: 2   ARNUITY ELLIPTA 100 MCG/ACT AEPB, Inhale 1 puff into the lungs daily., Disp: 30 each, Rfl: 5   aspirin 81 MG EC tablet, Take 1 tablet by mouth daily., Disp: , Rfl:    cabotegravir & rilpivirine ER (CABENUVA) 600 & 900 MG/3ML injection, Inject 1 kit into the muscle every 2 (two) months., Disp: 6 mL, Rfl: 5   cetirizine (ZYRTEC) 10 MG tablet, TAKE 1 TABLET(10 MG) BY MOUTH DAILY, Disp: 90 tablet, Rfl: 1   escitalopram (LEXAPRO) 20 MG tablet, Take 1 tablet (20 mg total) by mouth daily., Disp: 90 tablet, Rfl: 3   ezetimibe (ZETIA) 10 MG tablet, Take 1 tablet (10 mg total) by mouth daily., Disp: 90 tablet, Rfl: 3   fluticasone (FLOVENT HFA) 110 MCG/ACT inhaler, Inhale 2 puffs into the lungs 2 (two) times daily. Flovent to 4 puffs twice daily for TWO WEEKS, Disp: 1 each, Rfl: 5   ibuprofen (ADVIL)  200 MG tablet, Take 200 mg by mouth every 6 (six) hours as needed., Disp: , Rfl:    losartan (COZAAR) 50 MG tablet, Take 1 tablet (50 mg total) by mouth daily., Disp: 90 tablet, Rfl: 3   montelukast (SINGULAIR) 10 MG tablet, Take 1 tablet (10 mg total) by mouth at bedtime., Disp: 30 tablet, Rfl: 5   Olopatadine HCl 0.6 % SOLN, SPRAY 2 SPRAYS IN EACH NOSTRIL ONCE TO TWICE DAILY, Disp: 30.5 g, Rfl: 5   QVAR REDIHALER 80 MCG/ACT inhaler, Inhale 2 puffs into the lungs 2 (two) times daily., Disp: 10.6 g, Rfl: 5   tadalafil (CIALIS) 10 MG tablet, Take 0.5 tablets (5 mg total) by mouth daily as needed for erectile dysfunction., Disp: 20 tablet, Rfl: 2   Vitamin D, Ergocalciferol, (DRISDOL) 1.25 MG (50000 UNIT) CAPS capsule, TAKE 1 CAPSULE BY MOUTH EVERY 7 DAYS, Disp: 4 capsule, Rfl: 5   Medications ordered in this encounter:  Meds ordered this encounter  Medications   azithromycin (ZITHROMAX) 250 MG tablet    Sig: Take 2 tablets on day 1, then 1 tablet daily on days 2 through 5    Dispense:  6 tablet    Refill:  0    Order Specific Question:   Supervising Provider    Answer:   Merrilee Jansky [id number]     *If you need refills on other medications prior to your  next appointment, please contact your pharmacy*  Follow-Up: Call back or seek an in-person evaluation if the symptoms worsen or if the condition fails to improve as anticipated.  [facility] [phone]  Other Instructions  Sinus Infection, Adult A sinus infection, also called sinusitis, is inflammation of your sinuses. Sinuses are hollow spaces in the bones around your face. Your sinuses are located: Around your eyes. In the middle of your forehead. Behind your nose. In your cheekbones. Mucus normally drains out of your sinuses. When your nasal tissues become inflamed or swollen, mucus can become trapped or blocked. This allows bacteria, viruses, and fungi to grow, which leads to infection. Most infections of the  sinuses are caused by a virus. A sinus infection can develop quickly. It can last for up to 4 weeks (acute) or for more than 12 weeks (chronic). A sinus infection often develops after a cold. What are the causes? This condition is caused by anything that creates swelling in the sinuses or stops mucus from draining. This includes: Allergies. Asthma. Infection from bacteria or viruses. Deformities or blockages in your nose or sinuses. Abnormal growths in the nose (nasal polyps). Pollutants, such as chemicals or irritants in the air. Infection from fungi. This is rare. What increases the risk? You are more likely to develop this condition if you: Have a weak body defense system (immune system). Do a lot of swimming or diving. Overuse nasal sprays. Smoke. What are the signs or symptoms? The main symptoms of this condition are pain and a feeling of pressure around the affected sinuses. Other symptoms include: Stuffy nose or congestion that makes it difficult to breathe through your nose. Thick yellow or greenish drainage from your nose. Tenderness, swelling, and warmth over the affected sinuses. A cough that may get worse at night. Decreased sense of smell and taste. Extra mucus that collects in the throat or the back of the nose (postnasal drip) causing a sore throat or bad breath. Tiredness (fatigue). Fever. How is this diagnosed? This condition is diagnosed based on: Your symptoms. Your medical history. A physical exam. Tests to find out if your condition is acute or chronic. This may include: Checking your nose for nasal polyps. Viewing your sinuses using a device that has a light (endoscope). Testing for allergies or bacteria. Imaging tests, such as an MRI or CT scan. In rare cases, a bone biopsy may be done to rule out more serious types of fungal sinus disease. How is this treated? Treatment for a sinus infection depends on the cause and whether your condition is chronic or  acute. If caused by a virus, your symptoms should go away on their own within 10 days. You may be given medicines to relieve symptoms. They include: Medicines that shrink swollen nasal passages (decongestants). A spray that eases inflammation of the nostrils (topical intranasal corticosteroids). Rinses that help get rid of thick mucus in your nose (nasal saline washes). Medicines that treat allergies (antihistamines). Over-the-counter pain relievers. If caused by bacteria, your health care provider may recommend waiting to see if your symptoms improve. Most bacterial infections will get better without antibiotic medicine. You may be given antibiotics if you have: A severe infection. A weak immune system. If caused by narrow nasal passages or nasal polyps, surgery may be needed. Follow these instructions at home: Medicines Take, use, or apply over-the-counter and prescription medicines only as told by your health care provider. These may include nasal sprays. If you were prescribed an antibiotic  medicine, take it as told by your health care provider. Do not stop taking the antibiotic even if you start to feel better. Hydrate and humidify  Drink enough fluid to keep your urine pale yellow. Staying hydrated will help to thin your mucus. Use a cool mist humidifier to keep the humidity level in your home above 50%. Inhale steam for 10-15 minutes, 3-4 times a day, or as told by your health care provider. You can do this in the bathroom while a hot shower is running. Limit your exposure to cool or dry air. Rest Rest as much as possible. Sleep with your head raised (elevated). Make sure you get enough sleep each night. General instructions  Apply a warm, moist washcloth to your face 3-4 times a day or as told by your health care provider. This will help with discomfort. Use nasal saline washes as often as told by your health care provider. Wash your hands often with soap and water to reduce your  exposure to germs. If soap and water are not available, use hand sanitizer. Do not smoke. Avoid being around people who are smoking (secondhand smoke). Keep all follow-up visits. This is important. Contact a health care provider if: You have a fever. Your symptoms get worse. Your symptoms do not improve within 10 days. Get help right away if: You have a severe headache. You have persistent vomiting. You have severe pain or swelling around your face or eyes. You have vision problems. You develop confusion. Your neck is stiff. You have trouble breathing. These symptoms may be an emergency. Get help right away. Call 911. Do not wait to see if the symptoms will go away. Do not drive yourself to the hospital. Summary A sinus infection is soreness and inflammation of your sinuses. Sinuses are hollow spaces in the bones around your face. This condition is caused by nasal tissues that become inflamed or swollen. The swelling traps or blocks the flow of mucus. This allows bacteria, viruses, and fungi to grow, which leads to infection. If you were prescribed an antibiotic medicine, take it as told by your health care provider. Do not stop taking the antibiotic even if you start to feel better. Keep all follow-up visits. This is important. This information is not intended to replace advice given to you by your health care provider. Make sure you discuss any questions you have with your health care provider. Document Revised: 12/03/2020 Document Reviewed: 12/03/2020 Elsevier Patient Education  2023 Elsevier Inc.    If you have been instructed to have an in-person evaluation today at a local Urgent Care facility, please use the link below. It will take you to a list of all of our available [facility], including address, phone number and hours of operation. Please do not delay care.  [facility]  If you or a family member do not have a primary care provider, use the link  below to schedule a visit and establish care. When you choose a [facility] primary care physician or advanced practice provider, you gain a long-term partner in health. Find a Primary Care Provider  Learn more about [facility]'s in-office and virtual care options:  - Get Care Now

## 2022-05-04 NOTE — Telephone Encounter (Signed)
Advised patient that Robert Park is in clinic and he has recommended E-visit/ UC if patient can not be seen by PCP for Abx. Patient understood and was instructed on how to do mychart E visit.

## 2022-05-04 NOTE — Telephone Encounter (Signed)
Patient called office today requesting antibiotics. States that for the past two days he has been dealing with sinus infection/ head cold. Is concerned this is leading to bronchitis.  Will forward message to [facility], NP. Juanita Laster, RMA

## 2022-05-05 ENCOUNTER — Telehealth: Payer: Medicare Other | Admitting: Internal Medicine

## 2022-05-06 ENCOUNTER — Other Ambulatory Visit (HOSPITAL_COMMUNITY): Payer: Self-pay

## 2022-05-07 ENCOUNTER — Other Ambulatory Visit: Payer: Self-pay

## 2022-05-07 ENCOUNTER — Other Ambulatory Visit (HOSPITAL_COMMUNITY): Payer: Self-pay

## 2022-05-07 ENCOUNTER — Telehealth: Payer: Self-pay

## 2022-05-07 NOTE — Telephone Encounter (Signed)
[facility] Patient Advocate Encounter   I was successful in securing patient a $5,000.00 grant from [employer] (PA[employer]) to provide copayment coverage for Cabenuva.  This will make the out of pocket cost $0.00.     I have spoken with the patient.    The billing information is as follows and has been shared with [facility].           Patient knows to call the office with questions or concerns.  Clearance Coots, CPhT Specialty Pharmacy Patient [facility] for Infectious Disease Phone: [phone] Fax:  [phone]

## 2022-05-08 ENCOUNTER — Other Ambulatory Visit: Payer: Self-pay

## 2022-05-08 ENCOUNTER — Ambulatory Visit (INDEPENDENT_AMBULATORY_CARE_PROVIDER_SITE_OTHER): Payer: Medicare Other | Admitting: Pharmacist

## 2022-05-08 DIAGNOSIS — B20 Human immunodeficiency virus [HIV] disease: Secondary | ICD-10-CM | POA: Diagnosis not present

## 2022-05-08 DIAGNOSIS — Z9189 Other specified personal risk factors, not elsewhere classified: Secondary | ICD-10-CM

## 2022-05-08 MED ORDER — CABOTEGRAVIR & RILPIVIRINE ER 600 & 900 MG/3ML IM SUER
1.0000 | Freq: Once | INTRAMUSCULAR | Status: AC
  Administered 2022-05-08: 1 via INTRAMUSCULAR

## 2022-05-08 MED ORDER — VITAMIN D (ERGOCALCIFEROL) 1.25 MG (50000 UNIT) PO CAPS: 50000.0000 [IU] | ORAL_CAPSULE | ORAL | 0 refills | Status: DC

## 2022-05-08 NOTE — Progress Notes (Signed)
HPI: Robert Park is a 72 y.o. male who presents to the [facility] pharmacy clinic for [place] administration.  Patient Active Problem List   Diagnosis Date Noted   COVID-19 virus infection 11/21/2020   Mild persistent asthma, uncomplicated 07/02/2020   Seasonal and perennial allergic rhinitis 07/02/2020   Upper airway cough syndrome 06/25/2020   Allergic sinusitis 02/05/2020   Hypertension 08/07/2019   Prediabetes 08/07/2019   Hyperlipidemia 08/07/2019   GAD (generalized anxiety disorder) 08/07/2019   Localized osteoarthritis of left hand 08/07/2019   Colon polyps 06/06/2019   Human immunodeficiency virus (HIV) disease (HCC) 02/16/2019   Screening for STDs (sexually transmitted diseases) 02/16/2019   Osteopenia 02/16/2019   Healthcare maintenance 02/16/2019   Ulcerative colitis without complications (HCC) 02/16/2019    Patient's Medications  New Prescriptions   No medications on file  Previous Medications   ADALIMUMAB (HUMIRA) 40 MG/0.4ML PSKT    Inject 1 pen  into the skin every 14 (fourteen) days.   ALBUTEROL (VENTOLIN HFA) 108 (90 BASE) MCG/ACT INHALER    Inhale 1 puff into the lungs every 6 (six) hours as needed for wheezing or shortness of breath.   ARNUITY ELLIPTA 100 MCG/ACT AEPB    Inhale 1 puff into the lungs daily.   ASPIRIN 81 MG EC TABLET    Take 1 tablet by mouth daily.   AZITHROMYCIN (ZITHROMAX) 250 MG TABLET    Take 2 tablets on day 1, then 1 tablet daily on days 2 through 5   CABOTEGRAVIR & RILPIVIRINE ER (CABENUVA) 600 & 900 MG/3ML INJECTION    Inject 1 kit into the muscle every 2 (two) months.   CETIRIZINE (ZYRTEC) 10 MG TABLET    TAKE 1 TABLET(10 MG) BY MOUTH DAILY   ESCITALOPRAM (LEXAPRO) 20 MG TABLET    Take 1 tablet (20 mg total) by mouth daily.   EZETIMIBE (ZETIA) 10 MG TABLET    Take 1 tablet (10 mg total) by mouth daily.   FLUTICASONE (FLOVENT HFA) 110 MCG/ACT INHALER    Inhale 2 puffs into the lungs 2 (two) times daily. Flovent to 4 puffs twice  daily for TWO WEEKS   IBUPROFEN (ADVIL) 200 MG TABLET    Take 200 mg by mouth every 6 (six) hours as needed.   LOSARTAN (COZAAR) 50 MG TABLET    Take 1 tablet (50 mg total) by mouth daily.   MONTELUKAST (SINGULAIR) 10 MG TABLET    Take 1 tablet (10 mg total) by mouth at bedtime.   OLOPATADINE HCL 0.6 % SOLN    SPRAY 2 SPRAYS IN EACH NOSTRIL ONCE TO TWICE DAILY   QVAR REDIHALER 80 MCG/ACT INHALER    Inhale 2 puffs into the lungs 2 (two) times daily.   TADALAFIL (CIALIS) 10 MG TABLET    Take 0.5 tablets (5 mg total) by mouth daily as needed for erectile dysfunction.  Modified Medications   Modified Medication Previous Medication   VITAMIN D, ERGOCALCIFEROL, (DRISDOL) 1.25 MG (50000 UNIT) CAPS CAPSULE Vitamin D, Ergocalciferol, (DRISDOL) 1.25 MG (50000 UNIT) CAPS capsule      Take 1 capsule (50,000 Units total) by mouth every 7 (seven) days.    TAKE 1 CAPSULE BY MOUTH EVERY 7 DAYS  Discontinued Medications   No medications on file    Allergies: Allergies  Allergen Reactions   Elemental Sulfur    Prednisone    Statins     Muscle pains, GI symptoms   Sulfamethoxazole-Trimethoprim     Past Medical History: Past Medical  History:  Diagnosis Date   Allergy    Anxiety    Arthritis    Asthma    Cataract    Bilateral eyes   CMV colitis (HCC)    Depression    Diverticulosis    Dyslipidemia    ED (erectile dysfunction)    GERD (gastroesophageal reflux disease)    History of colon polyps    HIV infection (HCC)    Hyperlipidemia    Hypertension    Internal hemorrhoids    Osteopenia    Shingles    Sleep apnea    does not wear a c-pap   Ulcerative colitis (HCC)    Vitamin D deficiency     Social History: Social History   Socioeconomic History   Marital status: Divorced    Spouse name: Not on file   Number of children: 2   Years of education: Not on file   Highest education level: Not on file  Occupational History   Occupation: Building surveyor    Comment: Disabled    Occupation: Copywriter, advertising: [employer]    Comment: PT  Tobacco Use   Smoking status: Former    Types: Cigarettes    Quit date: 06/06/1987    Years since quitting: 34.9   Smokeless tobacco: Never   Tobacco comments:    quit 34 years ago  Vaping Use   Vaping Use: Never used  Substance and Sexual Activity   Alcohol use: Yes    Comment: every other day -social   Drug use: Not Currently    Types: Marijuana    Comment: occassional usage - last smoked 3 months ago   Sexual activity: Yes    Partners: Male    Comment: declined condoms  Other Topics Concern   Not on file  Social History Narrative   Current Social History 05/16/2020        Patient lives with house mate in a two level townhome with handrails inside and 1-2 outside steps without handrails       Patient's method of transportation is personal car.      The highest level of education was 2 years college.      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 Ducatte, BSN, RN-BC       Social Determinants of Health   Financial Resource Strain: Low Risk  (05/27/2021)   Overall Financial Resource Strain (CARDIA)    Difficulty of Paying Living Expenses: Not hard at all  Food Insecurity: No Food Insecurity (08/20/2021)   Hunger Vital Sign    Worried About Running Out of Food in the Last Year: Never true    Ran Out of Food in the Last Year: Never true  Transportation Needs: No Transportation Needs (05/27/2021)   PRAPARE - Administrator, Civil Service (Medical): No    Lack of Transportation (Non-Medical): No   Physical Activity: Insufficiently Active (05/27/2021)   Exercise Vital Sign  Days of Exercise per Week: 2 days    Minutes of Exercise per Session: 60 min  Stress: No Stress Concern Present (05/27/2021)   [employer] of Occupational Health - Occupational Stress Questionnaire    Feeling of Stress : Not at all  Social Connections: Unknown (05/27/2021)   Social Connection and Isolation Panel [NHANES]    Frequency of Communication with Friends and Family: Twice a week    Frequency of Social Gatherings with Friends and Family: Not on file    Attends Religious Services: Never    Active Member of Clubs or Organizations: No    Attends Banker Meetings: Never    Marital Status: Divorced    Labs: Lab Results  Component Value Date   HIV1RNAQUANT Not Detected 01/09/2022   HIV1RNAQUANT Not Detected 11/11/2021   HIV1RNAQUANT <20 (H) 07/16/2021   CD4TABS 757 11/11/2021   CD4TABS 962 09/17/2020   CD4TABS 1,261 04/09/2020    RPR and STI Lab Results  Component Value Date   LABRPR REACTIVE (A) 03/11/2021   LABRPR REACTIVE (A) 08/26/2020   LABRPR REACTIVE (A) 07/16/2020   LABRPR REACTIVE (A) 02/23/2020   LABRPR NON-REACTIVE 05/16/2019   RPRTITER 1:2 (H) 03/11/2021   RPRTITER 1:4 (H) 08/26/2020   RPRTITER 1:4 (H) 07/16/2020   RPRTITER 1:32 (H) 02/23/2020   RPRTITER 1:1 (H) 02/01/2019    STI Results GC CT  05/08/2021  2:03 PM Negative    Negative    Negative  Negative    Negative    Negative   08/26/2020  4:05 PM Negative    Positive    Positive  Negative    Negative    Negative   02/01/2019  2:30 PM Negative  Negative     Hepatitis B Lab Results  Component Value Date   HEPBSAB REACTIVE (A) 02/01/2019   HEPBSAG NON-REACTIVE 02/01/2019   HEPBCAB NON-REACTIVE 02/01/2019   Hepatitis C Lab Results  Component Value Date   HEPCAB NON-REACTIVE 02/01/2019   Hepatitis A Lab Results  Component Value Date   HAV REACTIVE (A) 02/01/2019   Lipids: Lab Results   Component Value Date   CHOL 261 (H) 03/11/2021   TRIG 370 (H) 03/11/2021   HDL 49 03/11/2021   CHOLHDL 5.3 (H) 03/11/2021   LDLCALC 155 (H) 03/11/2021    TARGET DATE:  The 29th of the month  Current HIV Regimen: Cabenuva  Assessment: Annette Stable presents today for their maintenance Cabenuva injections. Initial/past injections were tolerated well without issues. No problems with systemic effects of injections. Patient's "stiffness" and soreness has resolved completely after injections now.   Administered cabotegravir 600mg /4mL in left upper outer quadrant of the gluteal muscle. Administered rilpivirine 900 mg/29mL in the right upper outer quadrant of the gluteal muscle. Monitored patient for 10 minutes after injection. Injections were tolerated well without issue. Patient will follow up in 2 months for next injection. Will check HIV RNA today. No recent sexual activity; politely declines STI testing.   He requested a 90-day script for his vitamin D which I sent. No refills added; will need to address this with Tammy Sours at his next follow-up in August. Tammy Sours is not available during his June injection window, and Bill declined following up with Tammy Sours in between [facility] visits. Will see Tammy Sours in August.   Plan: - Cabenuva injections administered - Check HIV RNA - Resend vitamin D prescription as 90-day supply  - Next injections scheduled for 6/24 with Cassie and 9/6 with Tammy Sours  - Call with any  issues or questions  Margarite Gouge, PharmD, CPP, BCIDP, AAHIVP Clinical Pharmacist Practitioner Infectious Diseases Clinical Pharmacist [facility] for Infectious Disease

## 2022-05-10 LAB — HIV-1 RNA QUANT-NO REFLEX-BLD
HIV 1 RNA Quant: NOT DETECTED Copies/mL
HIV-1 RNA Quant, Log: NOT DETECTED Log cps/mL

## 2022-05-11 ENCOUNTER — Telehealth: Payer: Self-pay

## 2022-05-11 NOTE — Telephone Encounter (Signed)
[facility] Patient Advocate Encounter  Patient's medication Renaldo Harrison) have been couriered to [facility] from [employer] and was administered on the patient office visit on 05/08/22.  Clearance Coots , CPhT Specialty Pharmacy Patient [facility] for Infectious Disease Phone: [phone] Fax:  [phone]

## 2022-05-13 ENCOUNTER — Other Ambulatory Visit: Payer: Self-pay

## 2022-06-01 ENCOUNTER — Encounter: Payer: Self-pay | Admitting: Student in an Organized Health Care Education/Training Program

## 2022-06-01 ENCOUNTER — Ambulatory Visit (INDEPENDENT_AMBULATORY_CARE_PROVIDER_SITE_OTHER): Payer: Medicare Other | Admitting: Student in an Organized Health Care Education/Training Program

## 2022-06-01 VITALS — BP 163/94 | HR 67 | Temp 98.6°F | Ht 67.0 in | Wt 233.4 lb

## 2022-06-01 DIAGNOSIS — I10 Essential (primary) hypertension: Secondary | ICD-10-CM | POA: Diagnosis not present

## 2022-06-01 DIAGNOSIS — E782 Mixed hyperlipidemia: Secondary | ICD-10-CM

## 2022-06-01 DIAGNOSIS — M19042 Primary osteoarthritis, left hand: Secondary | ICD-10-CM

## 2022-06-01 DIAGNOSIS — M8580 Other specified disorders of bone density and structure, unspecified site: Secondary | ICD-10-CM | POA: Diagnosis not present

## 2022-06-01 DIAGNOSIS — R7303 Prediabetes: Secondary | ICD-10-CM

## 2022-06-01 DIAGNOSIS — E119 Type 2 diabetes mellitus without complications: Secondary | ICD-10-CM

## 2022-06-01 DIAGNOSIS — Z7984 Long term (current) use of oral hypoglycemic drugs: Secondary | ICD-10-CM | POA: Diagnosis not present

## 2022-06-01 LAB — POCT GLYCOSYLATED HEMOGLOBIN (HGB A1C): Hemoglobin A1C: 7.3 % — AB (ref 4.0–5.6)

## 2022-06-01 LAB — GLUCOSE, CAPILLARY: Glucose-Capillary: 193 mg/dL — ABNORMAL HIGH (ref 70–99)

## 2022-06-01 MED ORDER — DULOXETINE HCL 30 MG PO CPEP: 30.0000 mg | ORAL_CAPSULE | Freq: Every day | ORAL | 1 refills | Status: DC

## 2022-06-01 MED ORDER — METFORMIN HCL ER 500 MG PO TB24: 500.0000 mg | ORAL_TABLET | Freq: Every day | ORAL | 1 refills | Status: DC

## 2022-06-01 NOTE — Assessment & Plan Note (Signed)
Patient had DEXA scan completed due to a history of osteopenia.  I reviewed the results with him which still does show osteopenia.  He reports taking 50,000 units of vitamin D once a week for many years, previously prescribed by a physician in [place].  I recommended not taking this high of a dose in perpetuity, given risk of hypercalcemia.  Rather I think he would do fine with the regular daily dose of 1 or 2000 units in addition to regular nutrition.

## 2022-06-01 NOTE — Assessment & Plan Note (Signed)
Hyperlipidemia currently uncontrolled.  Last LDL was 160.  He is intolerant to many trials of statin therapy and not willing to try again.  We are continuing Zetia 10 mg daily.  This is primary prevention so I do not think there is good evidence to move towards a PCSK9 inhibitor.  Certainly if he has an ischemic event in the future that treatment would be indicated.  Perhaps with starting metformin and changes in his nutrition we will see some improvement in his lipids as well.  Could consider bempedoic acid in addition to the Zetia.

## 2022-06-01 NOTE — Assessment & Plan Note (Signed)
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  He had steroid injections in several of these joints with his orthopedist which have been helpful.  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  I recommended transitioning from Lexapro to Cymbalta to help with chronic pain and neuropathy experience. Will start with 30 mg daily.  We talked about reasonable expectations for this medication.  Follow-up with me in 3 months.  Continue management with orthopedics as well.  I am going to start him on metformin for the hyperglycemia and anticipation that he is going to continue with some amount of steroid injections.

## 2022-06-01 NOTE — Assessment & Plan Note (Signed)
Blood pressure is uncontrolled today, much higher than in previous visits.  This is in the context of worsening hyperglycemia, and a little weight gain.  Patient is not interested in adding antihypertensive medication at this time.  He would like to work on lifestyle modifications and monitor blood pressure more frequently.  He is going to purchase a cuff to monitor home readings.  He had some orthostatic symptoms when we tried a combination pill a few years ago.  If blood pressure is elevated at our next visit I will recommend losartan-HCTZ as I think his hypertension is getting worse with his advancing age.

## 2022-06-01 NOTE — Progress Notes (Signed)
Established Patient Office Visit  Subjective   Patient ID: Robert Park, male    DOB: 02-17-1950  Age: 72 y.o. MRN: [id number]  Chief Complaint  Patient presents with   Follow-up    HPI  72 year old person here for an annual wellness visit, however several concerns were identified and instead we conducted a focused problem-based visit.  Hopefully we will have time for the annual visit at his next follow-up appointment.  It has been almost 2 years since I last saw the patient.  We were monitoring hyperglycemia which seems to have gotten a little bit worse.  Functionally the patient is doing very well.  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  This requires about 4 hours of driving time each day.  He been doing a lot of traveling as well.  As a result he has had some weight gain, he has good insight into his nutrition choices affecting his weight.  Reports good adherence with his medications.  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  Denies any chest pain, pressure, or dyspnea on exertion.    Objective:     BP (!) 163/94 (BP Location: Left Arm, Patient Position: Sitting, Cuff Size: Small)   Pulse 67   Temp 98.6 F (37 C) (Oral)   Ht 5\' 7"  (1.702 m)   Wt 233 lb 6.4 oz (105.9 kg)   SpO2 98%   BMI 36.56 kg/m    Physical Exam   General: Well-appearing man, no distress CV: Regular rate and rhythm with no murmurs Lungs: Clear throughout with no wheezing or crackles Abdomen: Soft and nontender Extremities: Warm and well-perfused with no lower extremity edema Psych: Appropriate affect, not depressed or anxious appearing  Results for orders placed or performed in visit on 06/01/22  Glucose, capillary  Result Value Ref Range   Glucose-Capillary 193 (H) 70 - 99 mg/dL   POC Hbg Q6V  Result Value Ref Range   Hemoglobin A1C 7.3 (A) 4.0 - 5.6 %   HbA1c POC (<> result, manual entry)     HbA1c, POC (prediabetic range)     HbA1c, POC (controlled diabetic range)        The 10-year ASCVD risk score (Arnett DK, et al., 2019) is: 55.6%    Assessment & Plan:   Problem List Items Addressed This Visit       High   Hypertension (Chronic)    Blood pressure is uncontrolled today, much higher than in previous visits.  This is in the context of worsening hyperglycemia, and a little weight gain.  Patient is not interested in adding antihypertensive medication at this time.  He would like to work on lifestyle modifications and monitor blood pressure more frequently.  He is going to purchase a cuff to monitor home readings.  He had some orthostatic symptoms when we tried a combination pill a few years ago.  If blood pressure is elevated at our next visit I will recommend losartan-HCTZ as I think his hypertension is getting worse with his 72      Diabetes (HCC) - Primary (Chronic)    New diagnosis based on A1c today at 7.3% and a random glucose of 193. No complications seen so far. Will check urine microalbumin, but renal function has been normal based on Cr. He has signs of  metabolic syndrome, also has hypertension, hyperlipidemia, and obesity. We talked about nutrition, exercise, and we decided to start metformin 500mg  daily. We talked about side effects to expect. Goal will be for A1c less than 7%. Follow up with me in 3 months, recheck labs at that visit.      Relevant Medications   metFORMIN (GLUCOPHAGE-XR) 500 MG 24 hr tablet   Other Relevant Orders   Microalbumin / Creatinine Urine Ratio   Hyperlipidemia (Chronic)    Hyperlipidemia currently uncontrolled.  Last LDL was 160.  He is intolerant to many trials of statin therapy and not willing to try again.  We are continuing Zetia 10 mg daily.  This is primary prevention so I do not think there is good  evidence to move towards a PCSK9 inhibitor.  Certainly if he has an ischemic event in the future that treatment would be indicated.  Perhaps with starting metformin and changes in his nutrition we will see some improvement in his lipids as well.  Could consider bempedoic acid in addition to the Zetia.        Low   Osteopenia (Chronic)    Patient had DEXA scan completed due to a history of osteopenia.  I reviewed the results with him which still does show osteopenia.  He reports taking 50,000 units of vitamin D once a week for many years, previously prescribed by a physician in [place].  I recommended not taking this high of a dose in perpetuity, given risk of hypercalcemia.  Rather I think he would do fine with the regular daily dose of 1 or 2000 units in addition to regular nutrition.      Localized osteoarthritis of left hand (Chronic)    Patient with chronic pain due to osteoarthritis in the left hand, right wrist, right shoulder, and possible radicular pain down the right arm due to cervical spine osteoarthritis.  He had steroid injections in several of these joints with his orthopedist which have been helpful.  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  I recommended transitioning from Lexapro to Cymbalta to help with chronic pain and neuropathy experience. Will start with 30 mg daily.  We talked about reasonable expectations for this medication.  Follow-up with me in 3 months.  Continue management with orthopedics as well.  I am going to start him on metformin for the hyperglycemia and anticipation that he is going to continue with some amount of steroid injections.       Return in about 3 months (around 09/01/2022).    Tyson Alias, MD

## 2022-06-01 NOTE — Patient Instructions (Signed)
Stop taking Lexapro (escitalopram) We are replacing Lexapro with Cymbalta. Take one tablet daily. Start taking Metformin, one tablet daily, for diabetes.

## 2022-06-01 NOTE — Assessment & Plan Note (Signed)
New diagnosis based on A1c today at 7.3% and a random glucose of 193. No complications seen so far. Will check urine microalbumin, but renal function has been normal based on Cr. He has signs of metabolic syndrome, also has hypertension, hyperlipidemia, and obesity. We talked about nutrition, exercise, and we decided to start metformin 500mg  daily. We talked about side effects to expect. Goal will be for A1c less than 7%. Follow up with me in 3 months, recheck labs at that visit.

## 2022-06-02 LAB — MICROALBUMIN / CREATININE URINE RATIO
Creatinine, Urine: 90.1 mg/dL
Microalb/Creat Ratio: 24 mg/g creat (ref 0–29)
Microalbumin, Urine: 21.3 ug/mL

## 2022-06-25 NOTE — Progress Notes (Signed)
The 10-year ASCVD risk score (Arnett DK, et al., 2019) is: 57.9%   Values used to calculate the score:     Age: 72 years     Sex: Male     Is Non-Hispanic African American: No     Diabetic: Yes     Tobacco smoker: No     Systolic Blood Pressure: 163 mmHg     Is BP treated: Yes     HDL Cholesterol: 49 mg/dL     Total Cholesterol: 261 mg/dL  Sandie Ano, RN

## 2022-06-29 ENCOUNTER — Other Ambulatory Visit: Payer: Self-pay

## 2022-06-29 ENCOUNTER — Other Ambulatory Visit (HOSPITAL_COMMUNITY): Payer: Self-pay

## 2022-06-30 ENCOUNTER — Telehealth: Payer: Self-pay

## 2022-06-30 NOTE — Telephone Encounter (Signed)
[facility] Patient Advocate Encounter  Patient's medication Robert Park) have been couriered to [facility] from [facility] and will be administered on the patient next office visit on 07/06/22.  Clearance Coots , CPhT Specialty Pharmacy Patient [facility] Phone: [phone] Fax:  [phone]

## 2022-07-06 ENCOUNTER — Encounter: Payer: Medicare Other | Admitting: Pharmacist

## 2022-07-07 ENCOUNTER — Telehealth: Payer: Self-pay

## 2022-07-07 NOTE — Telephone Encounter (Signed)
Thank you :)

## 2022-07-07 NOTE — Telephone Encounter (Signed)
Pt called to let PCP know that he was advised  but his pharmacist  to DC his metformin  due to having reactions ... I have given him an appt with PCP 7/15   .Marland Kitchen Just letting you know ahead of time

## 2022-07-14 ENCOUNTER — Encounter: Payer: Self-pay | Admitting: Pharmacist

## 2022-07-14 ENCOUNTER — Ambulatory Visit: Payer: Medicare Other | Admitting: Pharmacist

## 2022-07-14 ENCOUNTER — Other Ambulatory Visit: Payer: Self-pay

## 2022-07-14 DIAGNOSIS — B20 Human immunodeficiency virus [HIV] disease: Secondary | ICD-10-CM

## 2022-07-14 MED ORDER — CABOTEGRAVIR & RILPIVIRINE ER 600 & 900 MG/3ML IM SUER
1.0000 | Freq: Once | INTRAMUSCULAR | Status: AC
  Administered 2022-07-14: 1 via INTRAMUSCULAR

## 2022-07-14 NOTE — Progress Notes (Signed)
HPI: Robert Park is a 72 y.o. male who presents to the [facility] pharmacy clinic for [place] administration.  Patient Active Problem List   Diagnosis Date Noted   Mild persistent asthma, uncomplicated 07/02/2020   Seasonal and perennial allergic rhinitis 07/02/2020   Upper airway cough syndrome 06/25/2020   Allergic sinusitis 02/05/2020   Hypertension 08/07/2019   Diabetes (HCC) 08/07/2019   Hyperlipidemia 08/07/2019   GAD (generalized anxiety disorder) 08/07/2019   Localized osteoarthritis of left hand 08/07/2019   Colon polyps 06/06/2019   Human immunodeficiency virus (HIV) disease (HCC) 02/16/2019   Screening for STDs (sexually transmitted diseases) 02/16/2019   Osteopenia 02/16/2019   Healthcare maintenance 02/16/2019   Ulcerative colitis without complications (HCC) 02/16/2019    Patient's Medications  New Prescriptions   No medications on file  Previous Medications   ADALIMUMAB (HUMIRA) 40 MG/0.4ML PSKT    Inject 1 pen  into the skin every 14 (fourteen) days.   ALBUTEROL (VENTOLIN HFA) 108 (90 BASE) MCG/ACT INHALER    Inhale 1 puff into the lungs every 6 (six) hours as needed for wheezing or shortness of breath.   ARNUITY ELLIPTA 100 MCG/ACT AEPB    Inhale 1 puff into the lungs daily.   CABOTEGRAVIR & RILPIVIRINE ER (CABENUVA) 600 & 900 MG/3ML INJECTION    Inject 1 kit into the muscle every 2 (two) months.   CETIRIZINE (ZYRTEC) 10 MG TABLET    TAKE 1 TABLET(10 MG) BY MOUTH DAILY   DULOXETINE (CYMBALTA) 30 MG CAPSULE    Take 1 capsule (30 mg total) by mouth daily.   EZETIMIBE (ZETIA) 10 MG TABLET    Take 1 tablet (10 mg total) by mouth daily.   LOSARTAN (COZAAR) 50 MG TABLET    Take 1 tablet (50 mg total) by mouth daily.   METFORMIN (GLUCOPHAGE-XR) 500 MG 24 HR TABLET    Take 1 tablet (500 mg total) by mouth daily with breakfast.   OLOPATADINE HCL 0.6 % SOLN    SPRAY 2 SPRAYS IN EACH NOSTRIL ONCE TO TWICE DAILY   TADALAFIL (CIALIS) 10 MG TABLET    Take 0.5 tablets (5 mg  total) by mouth daily as needed for erectile dysfunction.  Modified Medications   No medications on file  Discontinued Medications   No medications on file    Allergies: Allergies  Allergen Reactions   Elemental Sulfur    Prednisone    Statins     Muscle pains, GI symptoms   Sulfamethoxazole-Trimethoprim     Past Medical History: Past Medical History:  Diagnosis Date   Allergy    Anxiety    Arthritis    Asthma    Cataract    Bilateral eyes   CMV colitis (HCC)    Depression    Diverticulosis    Dyslipidemia    ED (erectile dysfunction)    GERD (gastroesophageal reflux disease)    History of colon polyps    HIV infection (HCC)    Hyperlipidemia    Hypertension    Internal hemorrhoids    Osteopenia    Shingles    Sleep apnea    does not wear a c-pap   Ulcerative colitis (HCC)    Vitamin D deficiency     Social History: Social History   Socioeconomic History   Marital status: Divorced    Spouse name: Not on file   Number of children: 2   Years of education: Not on file   Highest education level: Not on file  Occupational  History   Occupation: Building surveyor    Comment: Disabled   Occupation: Copywriter, advertising: [employer]    Comment: PT  Tobacco Use   Smoking status: Former    Types: Cigarettes    Quit date: 06/06/1987    Years since quitting: 35.1   Smokeless tobacco: Never   Tobacco comments:    quit 34 years ago  Vaping Use   Vaping Use: Never used  Substance and Sexual Activity   Alcohol use: Yes    Comment: every other day -social   Drug use: Not Currently    Types: Marijuana    Comment: occassional usage - last smoked 3 months ago   Sexual activity: Yes    Partners: Male    Comment: declined condoms  Other Topics Concern   Not on file  Social History Narrative   Current Social History 05/16/2020        Patient lives with house mate in a two level townhome with handrails inside and 1-2 outside steps without handrails        Patient's method of transportation is personal car.      The highest level of education was 2 years college.      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 different restaurants       Current Stressors: Worry about son with PTSD (15 years service in [place] and [place])       Religious / Personal Beliefs: "A bit Spiritual."       L. Ducatte, BSN, RN-BC       Social Determinants of Health   Financial Resource Strain: Low Risk  (05/27/2021)   Overall Financial Resource Strain (CARDIA)    Difficulty of Paying Living Expenses: Not hard at all  Food Insecurity: No Food Insecurity (08/20/2021)   Hunger Vital Sign    Worried About Running Out of Food in the Last Year: Never true    Ran Out of Food in the Last Year: Never true  Transportation Needs: No Transportation Needs (05/27/2021)   PRAPARE - Administrator, Civil Service (Medical): No    Lack of Transportation (Non-Medical): No  Physical Activity: Insufficiently Active (05/27/2021)   Exercise Vital Sign    Days of Exercise per Week: 2 days    Minutes of Exercise per Session: 60 min  Stress: No Stress Concern Present (05/27/2021)   [employer] of Occupational Health - Occupational Stress Questionnaire    Feeling of Stress : Not at all  Social Connections: Unknown (05/27/2021)   Social Connection and Isolation Panel [NHANES]    Frequency of Communication with Friends and Family: Twice a week    Frequency of Social Gatherings with Friends and Family: Not on file    Attends Religious Services: Never    Active Member of Clubs or Organizations: No    Attends Banker Meetings: Never    Marital Status: Divorced    Labs: Lab Results  Component Value Date   HIV1RNAQUANT Not  Detected 05/08/2022   HIV1RNAQUANT Not Detected 01/09/2022   HIV1RNAQUANT Not Detected 11/11/2021   CD4TABS 757 11/11/2021   CD4TABS 962 09/17/2020   CD4TABS 1,261 04/09/2020    RPR  and STI Lab Results  Component Value Date   LABRPR REACTIVE (A) 03/11/2021   LABRPR REACTIVE (A) 08/26/2020   LABRPR REACTIVE (A) 07/16/2020   LABRPR REACTIVE (A) 02/23/2020   LABRPR NON-REACTIVE 05/16/2019   RPRTITER 1:2 (H) 03/11/2021   RPRTITER 1:4 (H) 08/26/2020   RPRTITER 1:4 (H) 07/16/2020   RPRTITER 1:32 (H) 02/23/2020   RPRTITER 1:1 (H) 02/01/2019    STI Results GC CT  05/08/2021  2:03 PM Negative    Negative    Negative  Negative    Negative    Negative   08/26/2020  4:05 PM Negative    Positive    Positive  Negative    Negative    Negative   02/01/2019  2:30 PM Negative  Negative     Hepatitis B Lab Results  Component Value Date   HEPBSAB REACTIVE (A) 02/01/2019   HEPBSAG NON-REACTIVE 02/01/2019   HEPBCAB NON-REACTIVE 02/01/2019   Hepatitis C Lab Results  Component Value Date   HEPCAB NON-REACTIVE 02/01/2019   Hepatitis A Lab Results  Component Value Date   HAV REACTIVE (A) 02/01/2019   Lipids: Lab Results  Component Value Date   CHOL 261 (H) 03/11/2021   TRIG 370 (H) 03/11/2021   HDL 49 03/11/2021   CHOLHDL 5.3 (H) 03/11/2021   LDLCALC 155 (H) 03/11/2021    TARGET DATE: The 29th  Assessment: Bill presents today for his maintenance Cabenuva injections. Past injections were tolerated well without issues. Last HIV RNA was undetectable on 05/08/22. Will defer today.  Administered cabotegravir 600mg /7mL in left upper outer quadrant of the gluteal muscle. Administered rilpivirine 900 mg/70mL in the right upper outer quadrant of the gluteal muscle. No issues with injections. He will follow up in 2 months for next set of injections.  Plan: - Cabenuva injections administered - Next injections scheduled for 09/18/22 with Tammy Sours (will need to change this one as it is 1  day out of his injection window; sent MyChart message) - Call with any issues or questions  Rhian Funari L. Bao Bazen, PharmD, BCIDP, AAHIVP, CPP Clinical Pharmacist Practitioner Infectious Diseases Clinical Pharmacist [facility]

## 2022-07-21 ENCOUNTER — Ambulatory Visit: Payer: Medicare Other | Admitting: Allergy & Immunology

## 2022-07-21 ENCOUNTER — Telehealth: Payer: Self-pay | Admitting: Pharmacist

## 2022-07-21 NOTE — Telephone Encounter (Signed)
LVM requesting callback to reschedule his next appointment as it is one day out of his target window.  Margarite Gouge, PharmD, CPP, BCIDP, AAHIVP Clinical Pharmacist Practitioner Infectious Diseases Clinical Pharmacist [facility]

## 2022-07-27 ENCOUNTER — Encounter: Payer: Self-pay | Admitting: Student in an Organized Health Care Education/Training Program

## 2022-07-27 ENCOUNTER — Ambulatory Visit (INDEPENDENT_AMBULATORY_CARE_PROVIDER_SITE_OTHER): Payer: Medicare Other | Admitting: Student in an Organized Health Care Education/Training Program

## 2022-07-27 ENCOUNTER — Encounter: Payer: Self-pay | Admitting: *Deleted

## 2022-07-27 ENCOUNTER — Ambulatory Visit (INDEPENDENT_AMBULATORY_CARE_PROVIDER_SITE_OTHER): Payer: Medicare Other | Admitting: *Deleted

## 2022-07-27 VITALS — BP 143/87 | HR 67 | Temp 98.7°F | Ht 67.0 in | Wt 226.0 lb

## 2022-07-27 VITALS — BP 153/86 | HR 73 | Temp 98.7°F | Ht 67.0 in | Wt 226.0 lb

## 2022-07-27 DIAGNOSIS — I10 Essential (primary) hypertension: Secondary | ICD-10-CM

## 2022-07-27 DIAGNOSIS — E119 Type 2 diabetes mellitus without complications: Secondary | ICD-10-CM

## 2022-07-27 DIAGNOSIS — M19042 Primary osteoarthritis, left hand: Secondary | ICD-10-CM

## 2022-07-27 DIAGNOSIS — Z0000 Encounter for general adult medical examination without abnormal findings: Secondary | ICD-10-CM | POA: Diagnosis not present

## 2022-07-27 MED ORDER — SEMAGLUTIDE(0.25 OR 0.5MG/DOS) 2 MG/3ML ~~LOC~~ SOPN: 0.2500 mg | PEN_INJECTOR | SUBCUTANEOUS | 2 refills | Status: DC

## 2022-07-27 NOTE — Assessment & Plan Note (Signed)
Osteoarthritis in bilateral hands, bilateral shoulders, managed with steroid injections with orthopedics.  Doing well switching to duloxetine at her last visit.  Thinks this may be helping with the shoulder pain a little.  Will continue with this medication at 30 mg daily.  He has some sensitivities to medication adjustments.  We talked about his use of ibuprofen, has been using 4 mg daily.  Talked about the risk of optic ulcer disease and its kidney disease especially on losartan.  I encouraged him to use ibuprofen no more than 3 times weekly.

## 2022-07-27 NOTE — Assessment & Plan Note (Signed)
Patient unable to tolerate initiation of metformin due to GI side effects.  A1c is 7.3% less than 2 months ago.  We talked about options for other diabetes medications.  Decided to try Ozempic, starting at 0.25 mg once weekly.  Talked about increasing it to 0.5 mg after 4 weeks if he tolerates it well.  Follow-up with me in 3 months to monitor his response.  Goal A1c will be under 7%.

## 2022-07-27 NOTE — Assessment & Plan Note (Signed)
Blood pressure little elevated today.  Will plan to continue with losartan 50 mg daily.  Patient is sensitive to medications and we are making changes to the diabetes medications.  If blood pressure still elevated at our next visit may switch to losartan-HCTZ.

## 2022-07-27 NOTE — Progress Notes (Signed)
   Established Patient Office Visit  Subjective   Patient ID: Robert Park, male    DOB: June 27, 1950  Age: 72 y.o. MRN: [id number]  Chief Complaint  Patient presents with   Follow-up   Hypertension    HPI  72 year old person here for follow-up of diabetes.  I saw the patient a little less than 2 months ago where we made a new diagnosis of diabetes and started medication with metformin.  Patient did not tolerate the metformin well.  At the 500 mg daily dose he had issues with diarrhea and lower GI discomfort.  He tried to continue but then had issues with constipation.  Patient has sensitive lower GI tract due to ulcerative colitis on Humira, he feels like the metformin was making the symptoms worse.  He discontinued metformin about 3 weeks ago.  Making good lifestyle interventions, reports healthy diet, stays active through his work.  No chest pain or shortness of breath.  Reports some improvement in his arthritis type symptoms since switching to duloxetine.  No side effects to that medication.    Objective:     BP (!) 143/87 (BP Location: Right Arm, Patient Position: Sitting, Cuff Size: Small)   Pulse 67   Temp 98.7 F (37.1 C) (Oral)   Ht 5\' 7"  (1.702 m)   Wt 226 lb (102.5 kg)   SpO2 98%   BMI 35.40 kg/m    Physical Exam  Gen: Well-appearing, no distress Neck: Normal, no thyromegaly CV: Regular rate and rhythm with no murmurs Lungs: Unlabored, clear to auscultation Abd: Soft, nontender, nondistended Ext: Warm well-perfused with no lower extremity edema    Assessment & Plan:   Problem List Items Addressed This Visit       High   Hypertension - Primary (Chronic)    Blood pressure little elevated today.  Will plan to continue with losartan 50 mg daily.  Patient is sensitive to medications and we are making changes to the diabetes medications.  If blood pressure still elevated at our next visit may switch to losartan-HCTZ.      Diabetes (HCC) (Chronic)    Patient unable  to tolerate initiation of metformin due to GI side effects.  A1c is 7.3% less than 2 months ago.  We talked about options for other diabetes medications.  Decided to try Ozempic, starting at 0.25 mg once weekly.  Talked about increasing it to 0.5 mg after 4 weeks if he tolerates it well.  Follow-up with me in 3 months to monitor his response.  Goal A1c will be under 7%.      Relevant Medications   Semaglutide,0.25 or 0.5MG /DOS, 2 MG/3ML SOPN     Low   Localized osteoarthritis of left hand (Chronic)    Osteoarthritis in bilateral hands, bilateral shoulders, managed with steroid injections with orthopedics.  Doing well switching to duloxetine at her last visit.  Thinks this may be helping with the shoulder pain a little.  Will continue with this medication at 30 mg daily.  He has some sensitivities to medication adjustments.  We talked about his use of ibuprofen, has been using 4 mg daily.  Talked about the risk of optic ulcer disease and its kidney disease especially on losartan.  I encouraged him to use ibuprofen no more than 3 times weekly.       Return in about 3 months (around 10/27/2022).    Tyson Alias, MD

## 2022-07-27 NOTE — Progress Notes (Unsigned)
Subjective:   Robert Park is a 72 y.o. male who presents for Medicare Annual/Subsequent preventive examination.  Visit Complete: In person  Patient Medicare AWV questionnaire was completed by the patient on 07/27/2022; I have confirmed that all information answered by patient is correct and no changes since this date.  Review of Systems    Defer to pcp        Objective:    Today's Vitals   07/27/22 0959  BP: (!) 153/86  Pulse: 73  Temp: 98.7 F (37.1 C)  TempSrc: Oral  SpO2: 98%  Weight: 225 lb 15.5 oz (102.5 kg)  Height: 5\' 7"  (1.702 m)   Body mass index is 35.39 kg/m.     07/27/2022    1:11 PM 05/27/2021    9:42 AM 10/21/2020   11:18 AM 06/25/2020    4:28 PM 05/16/2020   10:25 AM 02/28/2020    8:48 PM 02/05/2020   11:35 AM  Advanced Directives  Does Patient Have a Medical Advance Directive? No Yes Yes Yes Yes No Yes  Type of Special educational needs teacher of [place];Living will Healthcare Power of [place];Living will Living will;Healthcare Power of [employer] Power of [place];Living will  Healthcare Power of [place];Living will  Does patient want to make changes to medical advance directive?   No - Patient declined No - Patient declined No - Patient declined  No - Patient declined  Copy of Healthcare Power of Attorney in Chart?  No - copy requested No - copy requested No - copy requested No - copy requested  No - copy requested  Would patient like information on creating a medical advance directive? No - Patient declined     No - Patient declined     Current Medications (verified) Outpatient Encounter Medications as of 07/27/2022  Medication Sig   Adalimumab (HUMIRA) 40 MG/0.4ML PSKT Inject 1 pen  into the skin every 14 (fourteen) days.   albuterol (VENTOLIN HFA) 108 (90 Base) MCG/ACT inhaler Inhale 1 puff into the lungs every 6 (six) hours as needed for wheezing or shortness of breath.   ARNUITY ELLIPTA 100 MCG/ACT AEPB Inhale 1 puff into the  lungs daily.   cabotegravir & rilpivirine ER (CABENUVA) 600 & 900 MG/3ML injection Inject 1 kit into the muscle every 2 (two) months.   cetirizine (ZYRTEC) 10 MG tablet TAKE 1 TABLET(10 MG) BY MOUTH DAILY   DULoxetine (CYMBALTA) 30 MG capsule Take 1 capsule (30 mg total) by mouth daily.   ezetimibe (ZETIA) 10 MG tablet Take 1 tablet (10 mg total) by mouth daily.   losartan (COZAAR) 50 MG tablet Take 1 tablet (50 mg total) by mouth daily.   Olopatadine HCl 0.6 % SOLN SPRAY 2 SPRAYS IN EACH NOSTRIL ONCE TO TWICE DAILY   Semaglutide,0.25 or 0.5MG /DOS, 2 MG/3ML SOPN Inject 0.25 mg into the skin once a week.   tadalafil (CIALIS) 10 MG tablet Take 0.5 tablets (5 mg total) by mouth daily as needed for erectile dysfunction.   No facility-administered encounter medications on file as of 07/27/2022.    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HCC)    Depression    Diverticulosis    Dyslipidemia    ED (erectile dysfunction)    GERD (gastroesophageal reflux disease)  History of colon polyps    HIV infection (HCC)    Hyperlipidemia    Hypertension    Internal hemorrhoids    Osteopenia    Shingles    Sleep apnea    does not wear a c-pap   Ulcerative colitis (HCC)    Vitamin D deficiency    Past Surgical History:  Procedure Laterality Date   CARPAL TUNNEL RELEASE Right    x 2   CARPAL TUNNEL RELEASE Left    x 2   CATARACT EXTRACTION, BILATERAL Bilateral 10/2017   COLONOSCOPY     HEMORRHOID SURGERY     MOUTH SURGERY     TONSILLECTOMY AND ADENOIDECTOMY     age 29   UPPER GASTROINTESTINAL ENDOSCOPY     VASECTOMY     Family History  Problem Relation Age of Onset   Colon cancer Mother 58   Heart failure Mother    Cervical cancer Mother    Leukemia Mother    Diabetes Mother    Lung  cancer Father    Diabetes Sister    Diverticulitis Brother    Diabetes Maternal Grandmother    Diabetes Brother    Hypertension Brother    Heart disease Brother    Hypertension Sister    Kidney disease Sister    Post-traumatic stress disorder Son    Hypertension Son    Hyperlipidemia Son    Anxiety disorder Son    Esophageal cancer Neg Hx    Rectal cancer Neg Hx    Stomach cancer Neg Hx    Social History   Socioeconomic History   Marital status: Divorced    Spouse name: Not on file   Number of children: 2   Years of education: Not on file   Highest education level: Not on file  Occupational History   Occupation: Building surveyor    Comment: Disabled   Occupation: Copywriter, advertising: [employer]    Comment: PT  Tobacco Use   Smoking status: Former    Current packs/day: 0.00    Types: Cigarettes    Quit date: 06/06/1987    Years since quitting: 35.1   Smokeless tobacco: Never   Tobacco comments:    quit 34 years ago  Vaping Use   Vaping status: Never Used  Substance and Sexual Activity   Alcohol use: Yes    Comment: every other day -social   Drug use: Not Currently    Types: Marijuana    Comment: occassional usage - last smoked 3 months ago   Sexual activity: Yes    Partners: Male    Comment: declined condoms  Other Topics Concern   Not on file  Social History Narrative   Current Social History 05/16/2020        Patient lives with house mate in a two level townhome with handrails inside and 1-2 outside steps without handrails       Patient's method of transportation is personal car.      The highest level of education was 2 years college.      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 Ducatte, BSN, RN-BC       Social Determinants of Health   Financial Resource Strain: Low Risk  (05/27/2021)   Overall Financial Resource Strain (CARDIA)    Difficulty of Paying Living Expenses: Not hard at all  Food Insecurity: No Food Insecurity (08/20/2021)   Hunger Vital Sign    Worried About Running Out of Food in the Last Year: Never true    Ran Out of Food in the Last Year: Never true  Transportation Needs: No Transportation Needs (05/27/2021)   PRAPARE - Administrator, Civil Service (Medical): No    Lack of Transportation (Non-Medical): No  Physical Activity: Insufficiently Active (05/27/2021)   Exercise Vital Sign    Days of Exercise per Week: 2 days    Minutes of Exercise per Session: 60 min  Stress: No Stress Concern Present (05/27/2021)   [employer] of Occupational Health - Occupational Stress Questionnaire    Feeling of Stress : Not at all  Social Connections: Unknown (05/27/2021)   Social Connection and Isolation Panel [NHANES]    Frequency of Communication with Friends and Family: Twice a week    Frequency of Social Gatherings with Friends and Family: Not on file    Attends Religious Services: Never    Database administrator or Organizations: No    Attends Banker Meetings: Never    Marital Status: Divorced    Tobacco Counseling Counseling given: Not Answered Tobacco comments: quit 34 years ago   Clinical Intake:  Pre-visit preparation completed: Yes  Pain : No/denies pain     BMI - recorded: 35.39 Nutritional Status: BMI > 30  Obese Nutritional Risks: None Diabetes: Yes CBG done?: No CBG resulted in Enter/ Edit results?: No Did pt. bring in CBG monitor from home?: No  How often do you need to have someone help you when you read instructions, pamphlets, or other written materials  from your doctor or pharmacy?: 1 - Never What is the last grade level you completed in school?: college  Interpreter Needed?: No  Information entered by :: [email or web address],cma 07/27/2022   Activities of Daily Living    07/27/2022   11:17 AM 06/01/2022    9:07 AM  In your present state of health, do you have any difficulty performing the following activities:  Hearing? 0 0  Vision? 0 0  Difficulty concentrating or making decisions? 0 0  Walking or climbing stairs? 0 0  Dressing or bathing? 0 0  Doing errands, shopping? 0 0    Patient Care Team: Tyson Alias, MD as PCP - General (Internal Medicine) Veryl Speak, FNP as Nurse Practitioner (Family Medicine) Danis, Andreas Blower, MD as Consulting Physician (Gastroenterology) Janalyn Harder, MD (Inactive) as Consulting Physician (Dermatology)  Indicate any recent Medical Services you may have received from other than [facility] providers in the past year (date may be approximate).     Assessment:   This is a routine wellness examination for Jadriel.  Hearing/Vision screen No results found.  Dietary issues and exercise activities discussed:     Goals Addressed   None   Depression Screen    07/27/2022   11:17 AM 06/01/2022   11:04 AM 11/11/2021   10:52 AM 05/27/2021    9:39 AM 03/11/2021   10:31 AM 10/21/2020   11:16 AM 09/17/2020  10:08 AM  PHQ 2/9 Scores  PHQ - 2 Score 0 0 0 0 0 0 0    Fall Risk    07/27/2022   11:17 AM 06/01/2022    9:08 AM 06/01/2022    9:07 AM 11/11/2021   10:51 AM 05/27/2021    9:43 AM  Fall Risk   Falls in the past year? 0 0 0 0 1  Number falls in past yr: 0 0 0 0 0  Injury with Fall? 0 0 0 0 0  Risk for fall due to :    No Fall Risks Other (Comment)  Risk for fall due to: Comment     blood pressure  Follow up Falls evaluation completed Falls evaluation completed Falls evaluation completed Falls evaluation completed Falls evaluation completed;Falls prevention discussed    MEDICARE RISK AT  HOME:   TIMED UP AND GO:  Was the test performed?  No           05/27/2021    9:48 AM  6CIT Screen  What Year? 0 points  What month? 0 points  What time? 0 points  Count back from 20 0 points  Months in reverse 0 points  Repeat phrase 0 points  Total Score 0 points    Immunizations Immunization History  Administered Date(s) Administered   COVID-19, mRNA, vaccine(Comirnaty)12 years and older 11/11/2021   Influenza-Unspecified 12/25/2014, 10/22/2015, 10/08/2016, 10/13/2019, 10/14/2020   Meningococcal Mcv4o 10/08/2016, 12/08/2016   PFIZER Comirnaty(Gray Top)Covid-19 Tri-Sucrose Vaccine 05/10/2020   PFIZER(Purple Top)SARS-COV-2 Vaccination 02/26/2019, 03/20/2019, 10/14/2019   Pfizer Covid-19 Vaccine Bivalent Booster 56yrs & up 01/21/2021   Pneumococcal Conjugate-13 08/07/2019, 03/04/2020   Pneumococcal Polysaccharide-23 05/01/2014   Tdap 02/19/2014   Zoster Recombinant(Shingrix) 10/14/2020    TDAP status: Up to date  Flu Vaccine status: Up to date  Pneumococcal vaccine status: Up to date  Covid-19 vaccine status: Completed vaccines  Qualifies for Shingles Vaccine? Yes   Zostavax completed No   Shingrix Completed?: Yes-one dose 10/14/2020  Screening Tests Health Maintenance  Topic Date Due   FOOT EXAM  Never done   OPHTHALMOLOGY EXAM  Never done   Zoster Vaccines- Shingrix (2 of 2) 12/09/2020   COVID-19 Vaccine (7 - 2023-24 season) 01/06/2022   INFLUENZA VACCINE  08/13/2022   HEMOGLOBIN A1C  12/02/2022   Diabetic kidney evaluation - eGFR measurement  01/10/2023   Colonoscopy  01/23/2023   Diabetic kidney evaluation - Urine ACR  06/01/2023   Medicare Annual Wellness (AWV)  07/27/2023   DTaP/Tdap/Td (2 - Td or Tdap) 02/20/2024   Pneumonia Vaccine 30+ Years old (3 of 3 - PPSV23 or PCV20) 03/04/2025   Hepatitis C Screening  Completed   HPV VACCINES  Aged Out    Health Maintenance  Health Maintenance Due  Topic Date Due   FOOT EXAM  Never done    OPHTHALMOLOGY EXAM  Never done   Zoster Vaccines- Shingrix (2 of 2) 12/09/2020   COVID-19 Vaccine (7 - 2023-24 season) 01/06/2022    Colorectal cancer screening: Type of screening: Colonoscopy. Completed 06/21/2019. Repeat every -on recall with [facility] for 01/13/2023  Lung Cancer Screening: (Low Dose CT Chest recommended if Age 42-80 years, 20 pack-year currently smoking OR have q/uit w/in 15years.) does not qualify.   Lung Cancer Screening Referral: n/a  Additional Screening:  Hepatitis C Screening: does not qualify; Completed 02/01/2019  Vision Screening: Recommended annual ophthalmology exams for early detection of glaucoma and other disorders of the eye. Is the patient up to date  with their annual eye exam?  Yes  Who is the provider or what is the name of the office in which the patient attends annual eye exams? "Need to get a new one" If pt is not established with a provider, would they like to be referred to a provider to establish care? Yes .   Dental Screening: Recommended annual dental exams for proper oral hygiene  Diabetic Foot Exam: Diabetic Foot Exam: Overdue, Pt has been advised about the importance in completing this exam. Pt is scheduled for diabetic foot exam on next visit.  Community Resource Referral / Chronic Care Management: CRR required this visit?  No   CCM required this visit?  No     Plan:     I have personally reviewed and noted the following in the patient's chart:   Medical and social history Use of alcohol, tobacco or illicit drugs  Current medications and supplements including opioid prescriptions. Patient is not currently taking opioid prescriptions. Functional ability and status Nutritional status Physical activity Advanced directives List of other physicians Hospitalizations, surgeries, and ER visits in previous 12 months Vitals Screenings to include cognitive, depression, and falls Referrals and appointments  In addition, I have  reviewed and discussed with patient certain preventive protocols, quality metrics, and best practice recommendations. A written personalized care plan for preventive services as well as general preventive health recommendations were provided to patient.     Joslyn Devon, [place]   07/28/2022   After Visit Summary: (MyChart) the after visit summary with patients personalized plan was offered to patient via MyChart   Nurse Notes: face to face  Mr. Marcy , Thank you for taking time to come for your Medicare Wellness Visit. I appreciate your ongoing commitment to your health goals. Please review the following plan we discussed and let me know if I can assist you in the future.   These are the goals we discussed:  Goals       care coordination (pt-stated)      . Patient would like a referral for a dermatologist within the [facility] network. SW sent careteam an inbasket with request.   . SDOH completed. No additional needs or interventions.      Exercise 3x per week (40 min per time) (pt-stated)      Going to gym      Weight (lb) < 200 lb (90.7 kg) (pt-stated)      Goal is to lose this weight prior to trip in October 2022        This is a list of the screening recommended for you and due dates:  Health Maintenance  Topic Date Due   Complete foot exam   Never done   Eye exam for diabetics  Never done   Zoster (Shingles) Vaccine (2 of 2) 12/09/2020   COVID-19 Vaccine (7 - 2023-24 season) 01/06/2022   Flu Shot  08/13/2022   Hemoglobin A1C  12/02/2022   Yearly kidney function blood test for diabetes  01/10/2023   Colon Cancer Screening  01/23/2023   Yearly kidney health urinalysis for diabetes  06/01/2023   Medicare Annual Wellness Visit  07/27/2023   DTaP/Tdap/Td vaccine (2 - Td or Tdap) 02/20/2024   Pneumonia Vaccine (3 of 3 - PPSV23 or PCV20) 03/04/2025   Hepatitis C Screening  Completed   HPV Vaccine  Aged Out

## 2022-08-18 ENCOUNTER — Telehealth: Payer: Self-pay

## 2022-08-18 MED ORDER — TADALAFIL 10 MG PO TABS: 5.0000 mg | ORAL_TABLET | Freq: Every day | ORAL | 2 refills | Status: DC | PRN

## 2022-08-18 NOTE — Telephone Encounter (Signed)
Patient calling to request refills and confirm if next appointment is with target window for Cabenuva injection. Routing message to pharmacy to confirm appt. And to provider to approve refill request.  Valarie Cones, LPN

## 2022-08-18 NOTE — Telephone Encounter (Signed)
Looks like he rescheduled for the 5th which is the last day of his window, so he is good. Thanks!

## 2022-08-18 NOTE — Telephone Encounter (Signed)
Medication has been refilled.

## 2022-08-18 NOTE — Addendum Note (Signed)
Addended by: Jeanine Luz D on: 08/18/2022 02:51 PM   Modules accepted: Orders

## 2022-08-19 NOTE — Telephone Encounter (Signed)
Patient made aware that medications have been filled and appointment is within target date.

## 2022-08-25 ENCOUNTER — Telehealth: Payer: Self-pay

## 2022-08-25 DIAGNOSIS — F32A Depression, unspecified: Secondary | ICD-10-CM

## 2022-08-25 DIAGNOSIS — M19042 Primary osteoarthritis, left hand: Secondary | ICD-10-CM

## 2022-08-25 DIAGNOSIS — E785 Hyperlipidemia, unspecified: Secondary | ICD-10-CM

## 2022-08-25 MED ORDER — DULOXETINE HCL 30 MG PO CPEP: 30.0000 mg | ORAL_CAPSULE | Freq: Every day | ORAL | 3 refills | Status: DC

## 2022-08-25 MED ORDER — LOSARTAN POTASSIUM 50 MG PO TABS: 50.0000 mg | ORAL_TABLET | Freq: Every day | ORAL | 3 refills | Status: DC

## 2022-08-25 NOTE — Telephone Encounter (Signed)
Incoming fax from pharmacy, requesting a 90 day supply.

## 2022-08-25 NOTE — Telephone Encounter (Signed)
90 day supplies of losartan and duloxetine refilled. Thank you.

## 2022-09-03 ENCOUNTER — Other Ambulatory Visit (HOSPITAL_COMMUNITY): Payer: Self-pay

## 2022-09-08 ENCOUNTER — Telehealth: Payer: Self-pay

## 2022-09-08 ENCOUNTER — Other Ambulatory Visit (HOSPITAL_COMMUNITY): Payer: Self-pay

## 2022-09-08 NOTE — Telephone Encounter (Signed)
Last seen in clinic February 2024.  He needs a next available follow-up with me for his ulcerative colitis.  Prescription form signed and in my office.  -HD

## 2022-09-08 NOTE — Telephone Encounter (Signed)
Received a refill request form from [facility] - Humira 40 mg/0.4 ml - Inject 1 pen into the skin every 14 days. 3 month supply with 1 year of refills.  Will need wet signature. Form placed in your office IN box for signature. Thanks

## 2022-09-08 NOTE — Telephone Encounter (Signed)
Prescription has been faxed back to [facility] assistance program.   Attempted to reach patient to schedule routine OV. Pt's phone goes straight to vm. Lm on vm for patient to return call.

## 2022-09-08 NOTE — Telephone Encounter (Signed)
PT returned call. Wants to know why he has to have a routine OV when he was just seen on 02/13/2022. Please advise.

## 2022-09-09 NOTE — Telephone Encounter (Signed)
Understood.  If he feels that his colitis is doing well and he is tolerating treatment without difficulty, he can see me in the office January or February of next year.  I was only offering now because I know we are booked out for office visits until November.  The January schedule should be finished soon.  HD

## 2022-09-09 NOTE — Telephone Encounter (Signed)
Called and spoke with patient. We reviewed recommendations below. Pt was advised to call back in a couple of months to schedule routine OV in January or February 2025. I have also placed a recall in the system. Pt verbalized understanding and had no concerns at the end of the call.   February 2025 office recall in epic.

## 2022-09-10 ENCOUNTER — Telehealth: Payer: Self-pay

## 2022-09-10 NOTE — Telephone Encounter (Signed)
[facility] Patient Advocate Encounter  Patient's medication Robert Park) have been couriered to [facility] from [facility] and will be administered on the patient next office visit on 09/17/22.  Clearance Coots , CPhT [facility] Pharmacy Patient [facility] Phone: [phone] Fax:  [phone]

## 2022-09-17 ENCOUNTER — Ambulatory Visit (INDEPENDENT_AMBULATORY_CARE_PROVIDER_SITE_OTHER): Payer: Medicare Other | Admitting: Family

## 2022-09-17 ENCOUNTER — Other Ambulatory Visit: Payer: Self-pay

## 2022-09-17 ENCOUNTER — Encounter: Payer: Self-pay | Admitting: Family

## 2022-09-17 VITALS — BP 132/93 | HR 78 | Resp 16 | Ht 67.0 in | Wt 218.0 lb

## 2022-09-17 DIAGNOSIS — Z79899 Other long term (current) drug therapy: Secondary | ICD-10-CM

## 2022-09-17 DIAGNOSIS — Z0000 Encounter for general adult medical examination without abnormal findings: Secondary | ICD-10-CM

## 2022-09-17 DIAGNOSIS — Z23 Encounter for immunization: Secondary | ICD-10-CM

## 2022-09-17 DIAGNOSIS — B20 Human immunodeficiency virus [HIV] disease: Secondary | ICD-10-CM | POA: Diagnosis not present

## 2022-09-17 MED ORDER — CABOTEGRAVIR & RILPIVIRINE ER 600 & 900 MG/3ML IM SUER
1.0000 | Freq: Once | INTRAMUSCULAR | Status: AC
  Administered 2022-09-17: 1 via INTRAMUSCULAR

## 2022-09-17 NOTE — Progress Notes (Signed)
Brief Narrative   Patient ID: Robert Park, male    DOB: July 09, 1950, 72 y.o.   MRN: [id number]  Robert Park is a 72 year old Caucasian gentleman diagnosed with HIV disease in the 2010 with risk factor of MSM.  Previous history of CMV colitis in 2010-2011.  Initial CD4 count of 450 with viral load of 21,000.  Entered care and CDC stage II.  [id number] negative. Previous medications include Atripla and Genovya.  Currently on Cabenuva.    Subjective:    Chief Complaint  Patient presents with   Follow-up    HPI:  Robert Park is a 72 y.o. male with HIV disease last seen by Margarite Gouge, PharmD, CPP with good adherence and tolerance to Cabenuva. Last viral was undetectable and CD4 count 1,021. Here today for follow up and injection.  Robert Park has been doing well since his last office visit and continues to tolerate Cabenuva with no significant adverse side effects. Has plans to go on a cruise soon. No new concerns/complaints. Condoms and STD testing offered. Due for routine dental care and working on finding a Education officer, community. Healthcare maintenance due includes influenza vaccination. Colon cancer screening is up to date.  Denies fevers, chills, night sweats, headaches, changes in vision, neck pain/stiffness, nausea, diarrhea, vomiting, lesions or rashes.   Allergies  Allergen Reactions   Elemental Sulfur    Prednisone    Statins     Muscle pains, GI symptoms   Sulfamethoxazole-Trimethoprim       Outpatient Medications Prior to Visit  Medication Sig Dispense Refill   Adalimumab (HUMIRA) 40 MG/0.4ML PSKT Inject 1 pen  into the skin every 14 (fourteen) days. 2 each 11   albuterol (VENTOLIN HFA) 108 (90 Base) MCG/ACT inhaler Inhale 1 puff into the lungs every 6 (six) hours as needed for wheezing or shortness of breath. 8 g 2   ARNUITY ELLIPTA 100 MCG/ACT AEPB Inhale 1 puff into the lungs daily. 30 each 5   cabotegravir & rilpivirine ER (CABENUVA) 600 & 900 MG/3ML injection Inject 1 kit into the  muscle every 2 (two) months. 6 mL 5   cetirizine (ZYRTEC) 10 MG tablet TAKE 1 TABLET(10 MG) BY MOUTH DAILY 90 tablet 1   DULoxetine (CYMBALTA) 30 MG capsule Take 1 capsule (30 mg total) by mouth daily. 90 capsule 3   ezetimibe (ZETIA) 10 MG tablet Take 1 tablet (10 mg total) by mouth daily. 90 tablet 3   losartan (COZAAR) 50 MG tablet Take 1 tablet (50 mg total) by mouth daily. 90 tablet 3   Olopatadine HCl 0.6 % SOLN SPRAY 2 SPRAYS IN EACH NOSTRIL ONCE TO TWICE DAILY 30.5 g 5   Semaglutide,0.25 or 0.5MG /DOS, 2 MG/3ML SOPN Inject 0.25 mg into the skin once a week. 3 mL 2   tadalafil (CIALIS) 10 MG tablet Take 0.5 tablets (5 mg total) by mouth daily as needed for erectile dysfunction. 20 tablet 2   No facility-administered medications prior to visit.     Past Medical History:  Diagnosis Date   Allergy    Anxiety    Arthritis    Asthma    Cataract    Bilateral eyes   CMV colitis (HCC)    Depression    Diverticulosis    Dyslipidemia    ED (erectile dysfunction)    GERD (gastroesophageal reflux disease)    History of colon polyps    HIV infection (HCC)    Hyperlipidemia    Hypertension    Internal hemorrhoids  Osteopenia    Shingles    Sleep apnea    does not wear a c-pap   Ulcerative colitis (HCC)    Vitamin D deficiency      Past Surgical History:  Procedure Laterality Date   CARPAL TUNNEL RELEASE Right    x 2   CARPAL TUNNEL RELEASE Left    x 2   CATARACT EXTRACTION, BILATERAL Bilateral 10/2017   COLONOSCOPY     HEMORRHOID SURGERY     MOUTH SURGERY     TONSILLECTOMY AND ADENOIDECTOMY     age 72   UPPER GASTROINTESTINAL ENDOSCOPY     VASECTOMY        Review of Systems  Constitutional:  Negative for appetite change, chills, fatigue, fever and unexpected weight change.  Eyes:  Negative for visual disturbance.  Respiratory:  Negative for cough, chest tightness, shortness of breath and wheezing.   Cardiovascular:  Negative for chest pain and leg swelling.   Gastrointestinal:  Negative for abdominal pain, constipation, diarrhea, nausea and vomiting.  Genitourinary:  Negative for dysuria, flank pain, frequency, genital sores, hematuria and urgency.  Skin:  Negative for rash.  Allergic/Immunologic: Negative for immunocompromised state.  Neurological:  Negative for dizziness and headaches.      Objective:    BP (!) 132/93   Pulse 78   Resp 16   Ht 5\' 7"  (1.702 m)   Wt 218 lb (98.9 kg)   BMI 34.14 kg/m  Nursing note and vital signs reviewed.  Physical Exam Constitutional:      General: He is not in acute distress.    Appearance: He is well-developed.  Eyes:     Conjunctiva/sclera: Conjunctivae normal.  Cardiovascular:     Rate and Rhythm: Normal rate and regular rhythm.     Heart sounds: Normal heart sounds. No murmur heard.    No friction rub. No gallop.  Pulmonary:     Effort: Pulmonary effort is normal. No respiratory distress.     Breath sounds: Normal breath sounds. No wheezing or rales.  Chest:     Chest wall: No tenderness.  Abdominal:     General: Bowel sounds are normal.     Palpations: Abdomen is soft.     Tenderness: There is no abdominal tenderness.  Musculoskeletal:     Cervical back: Neck supple.  Lymphadenopathy:     Cervical: No cervical adenopathy.  Skin:    General: Skin is warm and dry.     Findings: No rash.  Neurological:     Mental Status: He is alert and oriented to person, place, and time.  Psychiatric:        Behavior: Behavior normal.        Thought Content: Thought content normal.        Judgment: Judgment normal.         09/17/2022   10:56 AM 07/27/2022   11:17 AM 06/01/2022   11:04 AM 11/11/2021   10:52 AM 05/27/2021    9:39 AM  Depression screen PHQ 2/9  Decreased Interest 0 0 0 0 0  Down, Depressed, Hopeless 0 0 0 0 0  PHQ - 2 Score 0 0 0 0 0       Assessment & Plan:    Patient Active Problem List   Diagnosis Date Noted   Mild persistent asthma, uncomplicated 07/02/2020    Seasonal and perennial allergic rhinitis 07/02/2020   Upper airway cough syndrome 06/25/2020   Allergic sinusitis 02/05/2020   Hypertension 08/07/2019   Diabetes (  HCC) 08/07/2019   Hyperlipidemia 08/07/2019   GAD (generalized anxiety disorder) 08/07/2019   Localized osteoarthritis of left hand 08/07/2019   Colon polyps 06/06/2019   Human immunodeficiency virus (HIV) disease (HCC) 02/16/2019   Screening for STDs (sexually transmitted diseases) 02/16/2019   Osteopenia 02/16/2019   Healthcare maintenance 02/16/2019   Ulcerative colitis without complications (HCC) 02/16/2019     Problem List Items Addressed This Visit       Other   Human immunodeficiency virus (HIV) disease (HCC) - Primary (Chronic)    Robert Park continues to have well controlled virus with good adherence and tolerance with Cabenuva. Reviewed previous lab work and discussed plan of care and U equals U. Injection of Cabenuva provided with no complications. Plan for follow up with pharmacy provider for next 4 months and then with me in 6 months or sooner if needed.       Relevant Orders   COMPLETE METABOLIC PANEL WITH GFR   HIV-1 RNA quant-no reflex-bld   T-helper cell (CD4)- ([facility] clinic only)   Healthcare maintenance (Chronic)    Discussed importance of safe sexual practice and condom use. Condoms and STD testing offered.  High dose influenza vaccination provided.  Colon cancer screening up to date Working on finding a dentist for routine dental care.      Other Visit Diagnoses     Pharmacologic therapy       Relevant Orders   Lipid panel   Encounter for immunization       Relevant Orders   Flu Vaccine Trivalent High Dose (Fluad) (Completed)        I am having Robert Park "Robert Park" maintain his ezetimibe, Humira (2 Syringe), albuterol, Olopatadine HCl, cetirizine, Arnuity Ellipta, cabotegravir & rilpivirine ER, Semaglutide(0.25 or 0.5MG /DOS), tadalafil, DULoxetine, and losartan. We administered cabotegravir &  rilpivirine ER.   Meds ordered this encounter  Medications   cabotegravir & rilpivirine ER (CABENUVA) 600 & 900 MG/3ML injection 1 kit     Follow-up: Return in about 6 months (around 03/17/2023), or if symptoms worsen or fail to improve.   Marcos Eke, MSN, FNP-C Nurse Practitioner [facility] Main number: [phone]

## 2022-09-17 NOTE — Patient Instructions (Addendum)
Nice to see you.  We will check your lab work today.  Plan for follow up in 6 months or sooner if needed with lab work on the same day and remaining 2 month with pharmacy team.   Have a great day and stay safe!

## 2022-09-17 NOTE — Assessment & Plan Note (Signed)
Annette Stable continues to have well controlled virus with good adherence and tolerance with Cabenuva. Reviewed previous lab work and discussed plan of care and U equals U. Injection of Cabenuva provided with no complications. Plan for follow up with pharmacy provider for next 4 months and then with me in 6 months or sooner if needed.

## 2022-09-17 NOTE — Assessment & Plan Note (Signed)
Discussed importance of safe sexual practice and condom use. Condoms and STD testing offered.  High dose influenza vaccination provided.  Colon cancer screening up to date Working on finding a dentist for routine dental care.

## 2022-09-18 ENCOUNTER — Encounter: Payer: Medicare Other | Admitting: Family

## 2022-09-21 ENCOUNTER — Other Ambulatory Visit: Payer: Self-pay

## 2022-09-21 ENCOUNTER — Other Ambulatory Visit: Payer: Medicare Other

## 2022-09-21 DIAGNOSIS — B20 Human immunodeficiency virus [HIV] disease: Secondary | ICD-10-CM

## 2022-09-23 LAB — COMPLETE METABOLIC PANEL WITH GFR
AG Ratio: 1.8 (calc) (ref 1.0–2.5)
ALT: 21 U/L (ref 9–46)
AST: 15 U/L (ref 10–35)
Albumin: 4.4 g/dL (ref 3.6–5.1)
Alkaline phosphatase (APISO): 83 U/L (ref 35–144)
BUN: 12 mg/dL (ref 7–25)
CO2: 30 mmol/L (ref 20–32)
Calcium: 10.4 mg/dL — ABNORMAL HIGH (ref 8.6–10.3)
Chloride: 99 mmol/L (ref 98–110)
Creat: 0.86 mg/dL (ref 0.70–1.28)
Globulin: 2.5 g/dL (ref 1.9–3.7)
Glucose, Bld: 134 mg/dL — ABNORMAL HIGH (ref 65–99)
Potassium: 4.7 mmol/L (ref 3.5–5.3)
Sodium: 137 mmol/L (ref 135–146)
Total Bilirubin: 0.5 mg/dL (ref 0.2–1.2)
Total Protein: 6.9 g/dL (ref 6.1–8.1)
eGFR: 93 mL/min/{1.73_m2} (ref >=60)

## 2022-09-23 LAB — LIPID PANEL
Cholesterol: 249 mg/dL — ABNORMAL HIGH (ref <200)
HDL: 54 mg/dL (ref >=40)
LDL Cholesterol (Calc): 148 mg/dL — ABNORMAL HIGH
Non-HDL Cholesterol (Calc): 195 mg/dL — ABNORMAL HIGH (ref <130)
Total CHOL/HDL Ratio: 4.6 (calc) (ref <5.0)
Triglycerides: 288 mg/dL — ABNORMAL HIGH (ref <150)

## 2022-09-23 LAB — T-HELPER CELL (CD4) - (RCID CLINIC ONLY)
CD4 % Helper T Cell: 34 % (ref 33–65)
CD4 T Cell Abs: 737 /uL (ref 400–1790)

## 2022-09-23 LAB — HIV-1 RNA QUANT-NO REFLEX-BLD
HIV 1 RNA Quant: NOT DETECTED {copies}/mL
HIV-1 RNA Quant, Log: NOT DETECTED {Log_copies}/mL

## 2022-10-08 ENCOUNTER — Ambulatory Visit: Payer: Medicare Other | Admitting: Allergy & Immunology

## 2022-10-20 ENCOUNTER — Telehealth: Payer: Self-pay

## 2022-10-20 NOTE — Telephone Encounter (Signed)
Patient called office today requesting refill on Vitamin D. Would like refills sent to [facility] on [address].  Juanita Laster, RMA

## 2022-10-21 MED ORDER — VITAMIN D (ERGOCALCIFEROL) 1.25 MG (50000 UNIT) PO CAPS: 50000.0000 [IU] | ORAL_CAPSULE | ORAL | 3 refills | Status: DC

## 2022-10-21 NOTE — Addendum Note (Signed)
Addended by: Jeanine Luz D on: 10/21/2022 10:57 AM   Modules accepted: Orders

## 2022-10-21 NOTE — Telephone Encounter (Signed)
Pt updated Juanita Laster, RMA

## 2022-10-23 ENCOUNTER — Other Ambulatory Visit (HOSPITAL_COMMUNITY): Payer: Self-pay

## 2022-10-23 ENCOUNTER — Other Ambulatory Visit: Payer: Self-pay

## 2022-10-23 NOTE — Progress Notes (Signed)
Specialty Pharmacy Initial Fill Coordination Note  Robert Park is a 72 y.o. male contacted today regarding refills of specialty medication(s) Cabotegravir & Rilpivirine   Patient requested Courier to Provider Office   Delivery date: 11/03/22   Verified address: [facility] [address] [id number]   Medication will be filled on 11/01/20.   Patient is aware of 0.00 copayment.

## 2022-11-03 ENCOUNTER — Telehealth: Payer: Self-pay | Admitting: *Deleted

## 2022-11-03 ENCOUNTER — Telehealth: Payer: Self-pay

## 2022-11-03 MED ORDER — SEMAGLUTIDE(0.25 OR 0.5MG/DOS) 2 MG/3ML ~~LOC~~ SOPN: 0.5000 mg | PEN_INJECTOR | SUBCUTANEOUS | 2 refills | Status: DC

## 2022-11-03 NOTE — Telephone Encounter (Signed)
Call to patient informed him of the refill of the Ozempic to .5mg .  Will see Dr. Oswaldo Done at his appointment on Monday.

## 2022-11-03 NOTE — Telephone Encounter (Signed)
Ok. I have refilled ozempic at 0.5mg  subcutaneous once weekly. He can do that dose for 4 weeks, and if does well, we can increase to 1mg  after that. Looks like I have an appointment with him Monday, will check his A1c then. Thank you.

## 2022-11-03 NOTE — Telephone Encounter (Signed)
[facility] Patient Advocate Encounter  Patient's medications (CABENUVA) have been couriered to [facility] from [facility] and will be administered at the patients appointment on 11/12/22.  Kae Heller , CPhT Specialty Pharmacy Patient [facility] for Infectious Disease Phone: [phone] Fax:  [phone]

## 2022-11-05 DIAGNOSIS — M180 Bilateral primary osteoarthritis of first carpometacarpal joints: Secondary | ICD-10-CM | POA: Diagnosis not present

## 2022-11-09 ENCOUNTER — Encounter: Payer: Self-pay | Admitting: Student in an Organized Health Care Education/Training Program

## 2022-11-09 ENCOUNTER — Ambulatory Visit: Payer: Medicare Other | Admitting: Student in an Organized Health Care Education/Training Program

## 2022-11-09 VITALS — BP 140/81 | HR 60 | Temp 98.2°F | Ht 67.0 in | Wt 218.7 lb

## 2022-11-09 DIAGNOSIS — Z7985 Long-term (current) use of injectable non-insulin antidiabetic drugs: Secondary | ICD-10-CM

## 2022-11-09 DIAGNOSIS — I10 Essential (primary) hypertension: Secondary | ICD-10-CM | POA: Diagnosis not present

## 2022-11-09 DIAGNOSIS — L209 Atopic dermatitis, unspecified: Secondary | ICD-10-CM | POA: Insufficient documentation

## 2022-11-09 DIAGNOSIS — E119 Type 2 diabetes mellitus without complications: Secondary | ICD-10-CM | POA: Diagnosis not present

## 2022-11-09 LAB — GLUCOSE, CAPILLARY: Glucose-Capillary: 123 mg/dL — ABNORMAL HIGH (ref 70–99)

## 2022-11-09 LAB — POCT GLYCOSYLATED HEMOGLOBIN (HGB A1C): Hemoglobin A1C: 6.6 % — AB (ref 4.0–5.6)

## 2022-11-09 MED ORDER — TRIAMCINOLONE ACETONIDE 0.1 % EX CREA: 1.0000 | TOPICAL_CREAM | Freq: Two times a day (BID) | CUTANEOUS | 0 refills | Status: DC

## 2022-11-09 MED ORDER — SEMAGLUTIDE (1 MG/DOSE) 4 MG/3ML ~~LOC~~ SOPN: 1.0000 mg | PEN_INJECTOR | SUBCUTANEOUS | 3 refills | Status: DC

## 2022-11-09 NOTE — Patient Instructions (Addendum)
Great to see you today! Glad to hear that things are going well.  Today we talked about:   High blood pressure: Your blood pressure today was 140/81, which is a little higher than our goal of 130s. Keep taking your losartan 50 mg for now, and we will recheck this at your next visit. Please also continue taking your blood pressures at home.   Diabetes: Your A1c today is 6.6, which is below our target of 7%. Glad to hear that the Ozempic is working for you. Please continue taking the 0.5 mg dose for the next 4 weeks, then switch to the 1 mg dose, which should have been sent to the pharmacy. Let us know if you have any worsening GI symptoms.   Atopic dermatitis: We have sent a prescription of triamcinolone 0.1% to the pharmacy. Please apply to the rash on your back once per day as needed. You may also use it twice per day if the rash is severe. We have also sent that dermatology referral.  Keep up the good work and see you again in 3 months!

## 2022-11-09 NOTE — Assessment & Plan Note (Signed)
Moderate rash on his upper mid back consistent with atopic dermatitis, some excoriations from itching.  Will prescribe triamcinolone 0.1 mg cream to use daily.  Patient also requests a referral to dermatology for annual skin examinations which is reasonable.

## 2022-11-09 NOTE — Progress Notes (Signed)
   Established Patient Office Visit  Subjective   Patient ID: Robert Park, male    DOB: 1950/08/01  Age: 72 y.o. MRN: [id number]  Chief Complaint  Patient presents with   Diabetes   Follow-up    HPI  72 year old person here for management of hypertension and diabetes.  Doing well since I last saw him.  Started Ozempic and tolerating it well.  He is going moved to the 0.5 mg dose this week.  No upper GI side effects.  Tolerating other medications well.  No lightheadedness, dizziness, and falls.  Lives independently, active in all activities of daily living.  Still working.  No fevers or chills.  No recent illness.  No hospitalizations or ED visits.  Does report a rash on his upper back which comes and goes.  It has been itchy.  No associated systemic symptoms.  He has been using over-the-counter cortisone cream with only a little benefit.    Objective:     BP (!) 140/81 (BP Location: Right Arm, Patient Position: Sitting, Cuff Size: Small)   Pulse 60   Temp 98.2 F (36.8 C) (Oral)   Ht 5\' 7"  (1.702 m)   Wt 218 lb 11.2 oz (99.2 kg)   SpO2 98%   BMI 34.25 kg/m    Physical Exam  Gen: Well-appearing man, no distress Neck: Normal thyroid, no lymphadenopathy Heart: Regular rate and rhythm, no murmurs Lungs: Normal work of breathing, clear throughout Extremities: Warm well-perfused, no lower extremity edema Skin: On his upper mid back he has areas of raised erythematous dermatitis with 2 excoriations.  No pigmentation to the rash.  No plaque or overlying scale. Neuro: Alert, conversational, full strength upper and lower extremities, normal gait    Assessment & Plan:   Problem List Items Addressed This Visit       High   Hypertension (Chronic)    Blood pressure a little elevated above our goal.  I offered the patient a second antihypertensive agent but he declines.  He had dizziness with losartan 100 mg daily, so he is sensitive to the idea of increasing further.  Plan to  continue with losartan 50 mg daily.      Diabetes (HCC) - Primary (Chronic)    Well-controlled, hemoglobin A1c 6.6%.  No complications of diabetes.  Tolerating semaglutide well.  We will plan to transition from 0.5 mg weekly dose up to the 1.0 mg weekly dose.      Relevant Medications   Semaglutide, 1 MG/DOSE, 4 MG/3ML SOPN   Other Relevant Orders   POC Hbg A1C (Completed)     Unprioritized   Atopic dermatitis    Moderate rash on his upper mid back consistent with atopic dermatitis, some excoriations from itching.  Will prescribe triamcinolone 0.1 mg cream to use daily.  Patient also requests a referral to dermatology for annual skin examinations which is reasonable.      Relevant Medications   triamcinolone cream (KENALOG) 0.1 %   Other Relevant Orders   Ambulatory referral to Dermatology    Return in about 3 months (around 02/09/2023).    Tyson Alias, MD

## 2022-11-09 NOTE — Assessment & Plan Note (Signed)
Well-controlled, hemoglobin A1c 6.6%.  No complications of diabetes.  Tolerating semaglutide well.  We will plan to transition from 0.5 mg weekly dose up to the 1.0 mg weekly dose.

## 2022-11-09 NOTE — Assessment & Plan Note (Signed)
Blood pressure a little elevated above our goal.  I offered the patient a second antihypertensive agent but he declines.  He had dizziness with losartan 100 mg daily, so he is sensitive to the idea of increasing further.  Plan to continue with losartan 50 mg daily.

## 2022-11-11 NOTE — Progress Notes (Deleted)
HPI: Robert Park is a 72 y.o. male who presents to the [facility] pharmacy clinic for [place] administration.  Patient Active Problem List   Diagnosis Date Noted   Atopic dermatitis 11/09/2022   Mild persistent asthma, uncomplicated 07/02/2020   Seasonal and perennial allergic rhinitis 07/02/2020   Upper airway cough syndrome 06/25/2020   Allergic sinusitis 02/05/2020   Hypertension 08/07/2019   Diabetes (HCC) 08/07/2019   Hyperlipidemia 08/07/2019   GAD (generalized anxiety disorder) 08/07/2019   Localized osteoarthritis of left hand 08/07/2019   Colon polyps 06/06/2019   Human immunodeficiency virus (HIV) disease (HCC) 02/16/2019   Screening for STDs (sexually transmitted diseases) 02/16/2019   Osteopenia 02/16/2019   Healthcare maintenance 02/16/2019   Ulcerative colitis without complications (HCC) 02/16/2019    Patient's Medications  New Prescriptions   No medications on file  Previous Medications   ADALIMUMAB (HUMIRA) 40 MG/0.4ML PSKT    Inject 1 pen  into the skin every 14 (fourteen) days.   ALBUTEROL (VENTOLIN HFA) 108 (90 BASE) MCG/ACT INHALER    Inhale 1 puff into the lungs every 6 (six) hours as needed for wheezing or shortness of breath.   ARNUITY ELLIPTA 100 MCG/ACT AEPB    Inhale 1 puff into the lungs daily.   CABOTEGRAVIR & RILPIVIRINE ER (CABENUVA) 600 & 900 MG/3ML INJECTION    Inject 1 kit into the muscle every 2 (two) months.   CETIRIZINE (ZYRTEC) 10 MG TABLET    TAKE 1 TABLET(10 MG) BY MOUTH DAILY   DULOXETINE (CYMBALTA) 30 MG CAPSULE    Take 1 capsule (30 mg total) by mouth daily.   EZETIMIBE (ZETIA) 10 MG TABLET    Take 1 tablet (10 mg total) by mouth daily.   LOSARTAN (COZAAR) 50 MG TABLET    Take 1 tablet (50 mg total) by mouth daily.   OLOPATADINE HCL 0.6 % SOLN    SPRAY 2 SPRAYS IN EACH NOSTRIL ONCE TO TWICE DAILY   SEMAGLUTIDE, 1 MG/DOSE, 4 MG/3ML SOPN    Inject 1 mg into the skin once a week.   TADALAFIL (CIALIS) 10 MG TABLET    Take 0.5 tablets (5 mg  total) by mouth daily as needed for erectile dysfunction.   TRIAMCINOLONE CREAM (KENALOG) 0.1 %    Apply 1 Application topically 2 (two) times daily.   VITAMIN D, ERGOCALCIFEROL, (DRISDOL) 1.25 MG (50000 UNIT) CAPS CAPSULE    Take 1 capsule (50,000 Units total) by mouth every 7 (seven) days.  Modified Medications   No medications on file  Discontinued Medications   No medications on file    Allergies: Allergies  Allergen Reactions   Elemental Sulfur    Prednisone    Statins     Muscle pains, GI symptoms   Sulfamethoxazole-Trimethoprim     Past Medical History: Past Medical History:  Diagnosis Date   Allergy    Anxiety    Arthritis    Asthma    Cataract    Bilateral eyes   CMV colitis (HCC)    Depression    Diverticulosis    Dyslipidemia    ED (erectile dysfunction)    GERD (gastroesophageal reflux disease)    History of colon polyps    HIV infection (HCC)    Hyperlipidemia    Hypertension    Internal hemorrhoids    Osteopenia    Shingles    Sleep apnea    does not wear a c-pap   Ulcerative colitis (HCC)    Vitamin D deficiency  Social History: Social History   Socioeconomic History   Marital status: Divorced    Spouse name: Not on file   Number of children: 2   Years of education: Not on file   Highest education level: Not on file  Occupational History   Occupation: Building surveyor    Comment: Disabled   Occupation: Copywriter, advertising: [employer]    Comment: PT  Tobacco Use   Smoking status: Former    Current packs/day: 0.00    Types: Cigarettes    Quit date: 06/06/1987    Years since quitting: 35.4   Smokeless tobacco: Never   Tobacco comments:    quit 34 years ago  Vaping Use   Vaping status: Never Used  Substance and Sexual Activity   Alcohol use: Yes    Comment: every other day -social   Drug use: Not Currently    Types: Marijuana    Comment: occassional usage - last smoked 3 months ago   Sexual activity: Yes    Partners: Male     Comment: declined condoms  Other Topics Concern   Not on file  Social History Narrative   Current Social History 05/16/2020        Patient lives with house mate in a two level townhome with handrails inside and 1-2 outside steps without handrails       Patient's method of transportation is personal car.      The highest level of education was 2 years college.      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 [place]atte, BSN, RN-BC       Social Determinants of Health   Financial Resource Strain: Low Risk  (05/27/2021)   Overall Financial Resource Strain (CARDIA)    Difficulty of Paying Living Expenses: Not hard at all  Food Insecurity: No Food Insecurity (08/20/2021)   Hunger Vital Sign    Worried About Running Out of Food in the Last Year: Never true    Ran Out of Food in the Last Year: Never true  Transportation Needs: No Transportation Needs (05/27/2021)   PRAPARE - Administrator, Civil Service (Medical): No    Lack of Transportation (Non-Medical): No  Physical Activity: Insufficiently Active (05/27/2021)   Exercise Vital Sign    Days of Exercise per Week: 2 days    Minutes of Exercise per Session: 60 min  Stress: No Stress Concern Present (05/27/2021)   [employer] of Occupational Health - Occupational Stress Questionnaire    Feeling of Stress : Not at all  Social Connections: Unknown (05/27/2021)   Social Connection and Isolation Panel [NHANES]    Frequency of Communication with Friends and Family: Twice a week    Frequency of Social Gatherings with Friends  and Family: Not on file    Attends Religious Services: Never    Database administrator or Organizations: No    Attends Banker Meetings: Never  Marital Status: Divorced    Labs: Lab Results  Component Value Date   HIV1RNAQUANT Not Detected 09/21/2022   HIV1RNAQUANT Not Detected 05/08/2022   HIV1RNAQUANT Not Detected 01/09/2022   CD4TABS 737 09/21/2022   CD4TABS 757 11/11/2021   CD4TABS 962 09/17/2020    RPR and STI Lab Results  Component Value Date   LABRPR REACTIVE (A) 03/11/2021   LABRPR REACTIVE (A) 08/26/2020   LABRPR REACTIVE (A) 07/16/2020   LABRPR REACTIVE (A) 02/23/2020   LABRPR NON-REACTIVE 05/16/2019   RPRTITER 1:2 (H) 03/11/2021   RPRTITER 1:4 (H) 08/26/2020   RPRTITER 1:4 (H) 07/16/2020   RPRTITER 1:32 (H) 02/23/2020   RPRTITER 1:1 (H) 02/01/2019    STI Results GC CT  05/08/2021  2:03 PM Negative    Negative    Negative  Negative    Negative    Negative   08/26/2020  4:05 PM Negative    Positive    Positive  Negative    Negative    Negative   02/01/2019  2:30 PM Negative  Negative     Hepatitis B Lab Results  Component Value Date   HEPBSAB REACTIVE (A) 02/01/2019   HEPBSAG NON-REACTIVE 02/01/2019   HEPBCAB NON-REACTIVE 02/01/2019   Hepatitis C Lab Results  Component Value Date   HEPCAB NON-REACTIVE 02/01/2019   Hepatitis A Lab Results  Component Value Date   HAV REACTIVE (A) 02/01/2019   Lipids: Lab Results  Component Value Date   CHOL 249 (H) 09/21/2022   TRIG 288 (H) 09/21/2022   HDL 54 09/21/2022   CHOLHDL 4.6 09/21/2022   LDLCALC 148 (H) 09/21/2022    TARGET DATE:  The 29th of the month  Assessment: Bill presents today for their maintenance Cabenuva injections. Initial/past injections were tolerated well without issues. No problems with systemic effects of injections.   Administered cabotegravir 600mg /30mL in left upper outer quadrant of the gluteal muscle. Administered rilpivirine 900 mg/16mL in the right  upper outer quadrant of the gluteal muscle. Monitored patient for 10 minutes after injection. Injections were tolerated well without issue. Patient will follow up in 2 months for next injection. Will defer HIV RNA testing given it was recently assessed in September.   Politely declines STI testing today but accepts updated flu and COVID vaccines  Plan: - Cabenuva injections administered - Administer flu and COVID vaccines  - Next injections scheduled for *** with me and *** with Tammy Sours  - Call with any issues or questions  Margarite Gouge, PharmD, CPP, BCIDP, AAHIVP Clinical Pharmacist Practitioner Infectious Diseases Clinical Pharmacist [facility]

## 2022-11-12 ENCOUNTER — Encounter: Payer: Self-pay | Admitting: Pharmacist

## 2022-11-12 ENCOUNTER — Ambulatory Visit: Payer: Medicare Other | Admitting: Pharmacist

## 2022-11-13 ENCOUNTER — Ambulatory Visit: Payer: Medicare Other | Admitting: Pharmacist

## 2022-11-13 ENCOUNTER — Other Ambulatory Visit: Payer: Self-pay

## 2022-11-13 DIAGNOSIS — F32A Depression, unspecified: Secondary | ICD-10-CM

## 2022-11-13 DIAGNOSIS — E785 Hyperlipidemia, unspecified: Secondary | ICD-10-CM

## 2022-11-13 MED ORDER — EZETIMIBE 10 MG PO TABS: 10.0000 mg | ORAL_TABLET | Freq: Every day | ORAL | 3 refills | Status: DC

## 2022-11-13 DEATH — deceased

## 2022-11-15 NOTE — Progress Notes (Unsigned)
HPI: Robert Park is a 72 y.o. male who presents to the [facility] pharmacy clinic for [place] administration.  Patient Active Problem List   Diagnosis Date Noted   Atopic dermatitis 11/09/2022   Mild persistent asthma, uncomplicated 07/02/2020   Seasonal and perennial allergic rhinitis 07/02/2020   Upper airway cough syndrome 06/25/2020   Allergic sinusitis 02/05/2020   Hypertension 08/07/2019   Diabetes (HCC) 08/07/2019   Hyperlipidemia 08/07/2019   GAD (generalized anxiety disorder) 08/07/2019   Localized osteoarthritis of left hand 08/07/2019   Colon polyps 06/06/2019   Human immunodeficiency virus (HIV) disease (HCC) 02/16/2019   Screening for STDs (sexually transmitted diseases) 02/16/2019   Osteopenia 02/16/2019   Healthcare maintenance 02/16/2019   Ulcerative colitis without complications (HCC) 02/16/2019    Patient's Medications  New Prescriptions   No medications on file  Previous Medications   ADALIMUMAB (HUMIRA) 40 MG/0.4ML PSKT    Inject 1 pen  into the skin every 14 (fourteen) days.   ALBUTEROL (VENTOLIN HFA) 108 (90 BASE) MCG/ACT INHALER    Inhale 1 puff into the lungs every 6 (six) hours as needed for wheezing or shortness of breath.   ARNUITY ELLIPTA 100 MCG/ACT AEPB    Inhale 1 puff into the lungs daily.   CABOTEGRAVIR & RILPIVIRINE ER (CABENUVA) 600 & 900 MG/3ML INJECTION    Inject 1 kit into the muscle every 2 (two) months.   CETIRIZINE (ZYRTEC) 10 MG TABLET    TAKE 1 TABLET(10 MG) BY MOUTH DAILY   DULOXETINE (CYMBALTA) 30 MG CAPSULE    Take 1 capsule (30 mg total) by mouth daily.   EZETIMIBE (ZETIA) 10 MG TABLET    Take 1 tablet (10 mg total) by mouth daily.   LOSARTAN (COZAAR) 50 MG TABLET    Take 1 tablet (50 mg total) by mouth daily.   OLOPATADINE HCL 0.6 % SOLN    SPRAY 2 SPRAYS IN EACH NOSTRIL ONCE TO TWICE DAILY   SEMAGLUTIDE, 1 MG/DOSE, 4 MG/3ML SOPN    Inject 1 mg into the skin once a week.   TADALAFIL (CIALIS) 10 MG TABLET    Take 0.5 tablets (5 mg  total) by mouth daily as needed for erectile dysfunction.   TRIAMCINOLONE CREAM (KENALOG) 0.1 %    Apply 1 Application topically 2 (two) times daily.   VITAMIN D, ERGOCALCIFEROL, (DRISDOL) 1.25 MG (50000 UNIT) CAPS CAPSULE    Take 1 capsule (50,000 Units total) by mouth every 7 (seven) days.  Modified Medications   No medications on file  Discontinued Medications   No medications on file    Allergies: Allergies  Allergen Reactions   Elemental Sulfur    Prednisone    Statins     Muscle pains, GI symptoms   Sulfamethoxazole-Trimethoprim     Labs: Lab Results  Component Value Date   HIV1RNAQUANT Not Detected 09/21/2022   HIV1RNAQUANT Not Detected 05/08/2022   HIV1RNAQUANT Not Detected 01/09/2022   CD4TABS 737 09/21/2022   CD4TABS 757 11/11/2021   CD4TABS 962 09/17/2020    RPR and STI Lab Results  Component Value Date   LABRPR REACTIVE (A) 03/11/2021   LABRPR REACTIVE (A) 08/26/2020   LABRPR REACTIVE (A) 07/16/2020   LABRPR REACTIVE (A) 02/23/2020   LABRPR NON-REACTIVE 05/16/2019   RPRTITER 1:2 (H) 03/11/2021   RPRTITER 1:4 (H) 08/26/2020   RPRTITER 1:4 (H) 07/16/2020   RPRTITER 1:32 (H) 02/23/2020   RPRTITER 1:1 (H) 02/01/2019    STI Results GC CT  05/08/2021  2:03 PM  Negative    Negative    Negative  Negative    Negative    Negative   08/26/2020  4:05 PM Negative    Positive    Positive  Negative    Negative    Negative   02/01/2019  2:30 PM Negative  Negative     Hepatitis B Lab Results  Component Value Date   HEPBSAB REACTIVE (A) 02/01/2019   HEPBSAG NON-REACTIVE 02/01/2019   HEPBCAB NON-REACTIVE 02/01/2019   Hepatitis C Lab Results  Component Value Date   HEPCAB NON-REACTIVE 02/01/2019   Hepatitis A Lab Results  Component Value Date   HAV REACTIVE (A) 02/01/2019   Lipids: Lab Results  Component Value Date   CHOL 249 (H) 09/21/2022   TRIG 288 (H) 09/21/2022   HDL 54 09/21/2022   CHOLHDL 4.6 09/21/2022   LDLCALC 148 (H) 09/21/2022     TARGET DATE: The 29th of the month  Assessment: Bill presents today for his maintenance Cabenuva injections. Past injections were tolerated well without issues.  Administered cabotegravir 600mg /46mL in left upper outer quadrant of the gluteal muscle. Administered rilpivirine 900 mg/34mL in the right upper outer quadrant of the gluteal muscle. No issues with injections. *** will follow up in 2 months for next set of injections.  Immunizations: COVID, (?2nd dose Shingles?)  Plan: - Cabenuva injections administered - Next injections scheduled for *** - Will see Marcos Eke, NP in March - Call with any issues or questions  Lora Paula, PharmD PGY-2 Infectious Diseases Pharmacy Resident 11/15/2022 9:50 PM

## 2022-11-16 ENCOUNTER — Ambulatory Visit (INDEPENDENT_AMBULATORY_CARE_PROVIDER_SITE_OTHER): Payer: Medicare Other | Admitting: Pharmacist

## 2022-11-16 ENCOUNTER — Other Ambulatory Visit: Payer: Self-pay

## 2022-11-16 DIAGNOSIS — L2089 Other atopic dermatitis: Secondary | ICD-10-CM | POA: Diagnosis not present

## 2022-11-16 DIAGNOSIS — B20 Human immunodeficiency virus [HIV] disease: Secondary | ICD-10-CM

## 2022-11-16 DIAGNOSIS — D1801 Hemangioma of skin and subcutaneous tissue: Secondary | ICD-10-CM | POA: Diagnosis not present

## 2022-11-16 DIAGNOSIS — L918 Other hypertrophic disorders of the skin: Secondary | ICD-10-CM | POA: Diagnosis not present

## 2022-11-16 DIAGNOSIS — Z23 Encounter for immunization: Secondary | ICD-10-CM

## 2022-11-16 DIAGNOSIS — L814 Other melanin hyperpigmentation: Secondary | ICD-10-CM | POA: Diagnosis not present

## 2022-11-16 MED ORDER — CABOTEGRAVIR & RILPIVIRINE ER 600 & 900 MG/3ML IM SUER
1.0000 | Freq: Once | INTRAMUSCULAR | Status: AC
  Administered 2022-11-16: 1 via INTRAMUSCULAR

## 2022-11-16 NOTE — Addendum Note (Signed)
Addended by: Dalene Carrow on: 11/16/2022 04:22 PM   Modules accepted: Orders

## 2022-11-19 ENCOUNTER — Other Ambulatory Visit: Payer: Self-pay

## 2022-11-24 DIAGNOSIS — M25511 Pain in right shoulder: Secondary | ICD-10-CM | POA: Diagnosis not present

## 2022-12-24 ENCOUNTER — Other Ambulatory Visit (HOSPITAL_COMMUNITY): Payer: Self-pay

## 2022-12-24 ENCOUNTER — Other Ambulatory Visit: Payer: Self-pay

## 2022-12-24 NOTE — Progress Notes (Signed)
Specialty Pharmacy Refill Coordination Note  Dan Mickel is a 72 y.o. male assessed today regarding refills of clinic administered specialty medication(s) Cabotegravir & Rilpivirine [facility])   Clinic requested Courier to Provider Office   Delivery date: 01/08/23   Verified address: [address] [place] [address]   Medication will be filled on 01/07/23.

## 2023-01-05 ENCOUNTER — Ambulatory Visit: Payer: Medicare Other | Admitting: Allergy & Immunology

## 2023-01-07 ENCOUNTER — Other Ambulatory Visit: Payer: Self-pay

## 2023-01-11 ENCOUNTER — Telehealth: Payer: Self-pay

## 2023-01-11 NOTE — Telephone Encounter (Signed)
[facility] Patient Advocate Encounter  Patient's medications CABENUVA have been couriered to [facility] from [facility] and will be administered at the patients appointment on 01/15/23.  Kae Heller, CPhT Specialty Pharmacy Patient [facility] for Infectious Disease Phone: [phone] Fax:  [phone]

## 2023-01-14 ENCOUNTER — Ambulatory Visit: Payer: Medicare Other | Admitting: Family

## 2023-01-14 NOTE — Progress Notes (Signed)
 HPI: Robert Park is a 73 y.o. male who presents to the [facility] pharmacy clinic for Cabenuva  administration.  Patient Active Problem List   Diagnosis Date Noted   Atopic dermatitis 11/09/2022   Mild persistent asthma, uncomplicated 07/02/2020   Seasonal and perennial allergic rhinitis 07/02/2020   Upper airway cough syndrome 06/25/2020   Allergic sinusitis 02/05/2020   Hypertension 08/07/2019   Diabetes (HCC) 08/07/2019   Hyperlipidemia 08/07/2019   GAD (generalized anxiety disorder) 08/07/2019   Localized osteoarthritis of left hand 08/07/2019   Colon polyps 06/06/2019   Human immunodeficiency virus (HIV) disease (HCC) 02/16/2019   Screening for STDs (sexually transmitted diseases) 02/16/2019   Osteopenia 02/16/2019   Healthcare maintenance 02/16/2019   Ulcerative colitis without complications (HCC) 02/16/2019    Patient's Medications  New Prescriptions   No medications on file  Previous Medications   ADALIMUMAB  (HUMIRA ) 40 MG/0.4ML PSKT    Inject 1 pen  into the skin every 14 (fourteen) days.   ALBUTEROL  (VENTOLIN  HFA) 108 (90 BASE) MCG/ACT INHALER    Inhale 1 puff into the lungs every 6 (six) hours as needed for wheezing or shortness of breath.   ARNUITY ELLIPTA  100 MCG/ACT AEPB    Inhale 1 puff into the lungs daily.   CABOTEGRAVIR  & RILPIVIRINE  ER (CABENUVA ) 600 & 900 MG/3ML INJECTION    Inject 1 kit into the muscle every 2 (two) months.   CETIRIZINE  (ZYRTEC ) 10 MG TABLET    TAKE 1 TABLET(10 MG) BY MOUTH DAILY   DULOXETINE  (CYMBALTA ) 30 MG CAPSULE    Take 1 capsule (30 mg total) by mouth daily.   EZETIMIBE  (ZETIA ) 10 MG TABLET    Take 1 tablet (10 mg total) by mouth daily.   LOSARTAN  (COZAAR ) 50 MG TABLET    Take 1 tablet (50 mg total) by mouth daily.   OLOPATADINE  HCL 0.6 % SOLN    SPRAY 2 SPRAYS IN EACH NOSTRIL ONCE TO TWICE DAILY   SEMAGLUTIDE , 1 MG/DOSE, 4 MG/3ML SOPN    Inject 1 mg into the skin once a week.   TADALAFIL  (CIALIS ) 10 MG TABLET    Take 0.5 tablets (5 mg  total) by mouth daily as needed for erectile dysfunction.   TRIAMCINOLONE  CREAM (KENALOG ) 0.1 %    Apply 1 Application topically 2 (two) times daily.   VITAMIN D , ERGOCALCIFEROL , (DRISDOL ) 1.25 MG (50000 UNIT) CAPS CAPSULE    Take 1 capsule (50,000 Units total) by mouth every 7 (seven) days.  Modified Medications   No medications on file  Discontinued Medications   No medications on file    Allergies: Allergies  Allergen Reactions   Elemental Sulfur    Prednisone    Statins     Muscle pains, GI symptoms   Sulfamethoxazole-Trimethoprim     Past Medical History: Past Medical History:  Diagnosis Date   Allergy    Anxiety    Arthritis    Asthma    Cataract    Bilateral eyes   CMV colitis (HCC)    Depression    Diverticulosis    Dyslipidemia    ED (erectile dysfunction)    GERD (gastroesophageal reflux disease)    History of colon polyps    HIV infection (HCC)    Hyperlipidemia    Hypertension    Internal hemorrhoids    Osteopenia    Shingles    Sleep apnea    does not wear a c-pap   Ulcerative colitis (HCC)    Vitamin D  deficiency  Social History: Social History   Socioeconomic History   Marital status: Divorced    Spouse name: Not on file   Number of children: 2   Years of education: Not on file   Highest education level: Not on file  Occupational History   Occupation: Building surveyor    Comment: Disabled   Occupation: Copywriter, Advertising: [employer]    Comment: PT  Tobacco Use   Smoking status: Former    Current packs/day: 0.00    Types: Cigarettes    Quit date: 06/06/1987    Years since quitting: 35.6   Smokeless tobacco: Never   Tobacco comments:    quit 34 years ago  Vaping Use   Vaping status: Never Used  Substance and Sexual Activity   Alcohol use: Yes    Comment: every other day -social   Drug use: Not Currently    Types: Marijuana    Comment: occassional usage - last smoked 3 months ago   Sexual activity: Yes    Partners: Male     Comment: declined condoms  Other Topics Concern   Not on file  Social History Narrative   Current Social History 05/16/2020        Patient lives with house mate in a two level townhome with handrails inside and 1-2 outside steps without handrails       Patient's method of transportation is personal car.      The highest level of education was 2 years college.      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       FREDRIK Alcide, BSN, RN-BC       Social Drivers of Health   Financial Resource Strain: Low Risk  (05/27/2021)   Overall Financial Resource Strain (CARDIA)    Difficulty of Paying Living Expenses: Not hard at all  Food Insecurity: No Food Insecurity (08/20/2021)   Hunger Vital Sign    Worried About Running Out of Food in the Last Year: Never true    Ran Out of Food in the Last Year: Never true  Transportation Needs: No Transportation Needs (05/27/2021)   PRAPARE - Administrator, Civil Service (Medical): No    Lack of Transportation (Non-Medical): No  Physical Activity: Insufficiently Active (05/27/2021)   Exercise Vital Sign    Days of Exercise per Week: 2 days    Minutes of Exercise per Session: 60 min  Stress: No Stress Concern Present (05/27/2021)   [employer] of Occupational Health - Occupational Stress Questionnaire    Feeling of Stress : Not at all  Social Connections: Unknown (05/27/2021)   Social Connection and Isolation Panel [NHANES]    Frequency of Communication with Friends and Family: Twice a week    Frequency of Social Gatherings with Friends and  Family: Not on file    Attends Religious Services: Never    Database Administrator or Organizations: No    Attends Banker Meetings: Never  Marital Status: Divorced    Labs: Lab Results  Component Value Date   HIV1RNAQUANT Not Detected 09/21/2022   HIV1RNAQUANT Not Detected 05/08/2022   HIV1RNAQUANT Not Detected 01/09/2022   CD4TABS 737 09/21/2022   CD4TABS 757 11/11/2021   CD4TABS 962 09/17/2020    RPR and STI Lab Results  Component Value Date   LABRPR REACTIVE (A) 03/11/2021   LABRPR REACTIVE (A) 08/26/2020   LABRPR REACTIVE (A) 07/16/2020   LABRPR REACTIVE (A) 02/23/2020   LABRPR NON-REACTIVE 05/16/2019   RPRTITER 1:2 (H) 03/11/2021   RPRTITER 1:4 (H) 08/26/2020   RPRTITER 1:4 (H) 07/16/2020   RPRTITER 1:32 (H) 02/23/2020   RPRTITER 1:1 (H) 02/01/2019    STI Results GC CT  05/08/2021  2:03 PM Negative    Negative    Negative  Negative    Negative    Negative   08/26/2020  4:05 PM Negative    Positive    Positive  Negative    Negative    Negative   02/01/2019  2:30 PM Negative  Negative     Hepatitis B Lab Results  Component Value Date   HEPBSAB REACTIVE (A) 02/01/2019   HEPBSAG NON-REACTIVE 02/01/2019   HEPBCAB NON-REACTIVE 02/01/2019   Hepatitis C Lab Results  Component Value Date   HEPCAB NON-REACTIVE 02/01/2019   Hepatitis A Lab Results  Component Value Date   HAV REACTIVE (A) 02/01/2019   Lipids: Lab Results  Component Value Date   CHOL 249 (H) 09/21/2022   TRIG 288 (H) 09/21/2022   HDL 54 09/21/2022   CHOLHDL 4.6 09/21/2022   LDLCALC 148 (H) 09/21/2022    TARGET DATE:  The 29th of the month  Assessment: Jaydenn presents today for their maintenance Cabenuva  injections. Initial/past injections were tolerated well without issues. No problems with systemic effects of injections.   Administered cabotegravir  600mg /33mL in left upper outer quadrant of the gluteal muscle. Administered rilpivirine  900 mg/3mL in the right  upper outer quadrant of the gluteal muscle. Monitored patient for 10 minutes after injection. Injections were tolerated well without issue. Patient will follow up in 2 months for next injection. Will defer HIV RNA testing until his next  visit with Cathlyn.  Plan: - Cabenuva  injections administered - Next injections scheduled for 2/28 with Cathlyn and 4/23 with Cassie - Call with any issues or questions  Alan Geralds, PharmD, CPP, BCIDP, AAHIVP Clinical Pharmacist Practitioner Infectious Diseases Clinical Pharmacist [facility]

## 2023-01-15 ENCOUNTER — Ambulatory Visit (INDEPENDENT_AMBULATORY_CARE_PROVIDER_SITE_OTHER): Payer: Medicare Other | Admitting: Pharmacist

## 2023-01-15 ENCOUNTER — Ambulatory Visit: Payer: Medicare Other | Admitting: Pharmacist

## 2023-01-15 ENCOUNTER — Other Ambulatory Visit: Payer: Self-pay

## 2023-01-15 DIAGNOSIS — B20 Human immunodeficiency virus [HIV] disease: Secondary | ICD-10-CM | POA: Diagnosis not present

## 2023-01-15 MED ORDER — CABOTEGRAVIR & RILPIVIRINE ER 600 & 900 MG/3ML IM SUER
1.0000 | Freq: Once | INTRAMUSCULAR | Status: AC
  Administered 2023-01-15: 1 via INTRAMUSCULAR

## 2023-01-27 ENCOUNTER — Encounter: Payer: Self-pay | Admitting: Gastroenterology

## 2023-01-28 ENCOUNTER — Ambulatory Visit: Payer: Medicare Other | Admitting: Allergy & Immunology

## 2023-02-01 ENCOUNTER — Other Ambulatory Visit: Payer: Self-pay | Admitting: *Deleted

## 2023-02-01 MED ORDER — ALBUTEROL SULFATE HFA 108 (90 BASE) MCG/ACT IN AERS: INHALATION_SPRAY | RESPIRATORY_TRACT | 0 refills | Status: DC

## 2023-02-03 ENCOUNTER — Encounter: Payer: Self-pay | Admitting: Gastroenterology

## 2023-02-15 ENCOUNTER — Encounter: Payer: Self-pay | Admitting: Student in an Organized Health Care Education/Training Program

## 2023-02-15 ENCOUNTER — Ambulatory Visit (INDEPENDENT_AMBULATORY_CARE_PROVIDER_SITE_OTHER): Payer: Medicare Other | Admitting: Student in an Organized Health Care Education/Training Program

## 2023-02-15 VITALS — BP 138/82 | HR 78 | Temp 98.7°F | Ht 67.0 in | Wt 207.8 lb

## 2023-02-15 DIAGNOSIS — Z7985 Long-term (current) use of injectable non-insulin antidiabetic drugs: Secondary | ICD-10-CM | POA: Diagnosis not present

## 2023-02-15 DIAGNOSIS — H1131 Conjunctival hemorrhage, right eye: Secondary | ICD-10-CM | POA: Diagnosis not present

## 2023-02-15 DIAGNOSIS — E119 Type 2 diabetes mellitus without complications: Secondary | ICD-10-CM | POA: Diagnosis not present

## 2023-02-15 DIAGNOSIS — I10 Essential (primary) hypertension: Secondary | ICD-10-CM

## 2023-02-15 DIAGNOSIS — J4530 Mild persistent asthma, uncomplicated: Secondary | ICD-10-CM

## 2023-02-15 LAB — POCT GLYCOSYLATED HEMOGLOBIN (HGB A1C): Hemoglobin A1C: 6.3 % — AB (ref 4.0–5.6)

## 2023-02-15 LAB — GLUCOSE, CAPILLARY: Glucose-Capillary: 150 mg/dL — ABNORMAL HIGH (ref 70–99)

## 2023-02-15 MED ORDER — TADALAFIL 10 MG PO TABS: 5.0000 mg | ORAL_TABLET | Freq: Every day | ORAL | 2 refills | Status: AC | PRN

## 2023-02-15 NOTE — Assessment & Plan Note (Signed)
Asymptomatic hypercalcemia noted on last 2 blood draws.  Most likely cause is oversupplementation with high-dose vitamin D.  He is been using this for few years now partly because insurance coverage is better for this high dose version than a regular supplements.  I am going to check his calcium levels today, check vitamin D, and check PTH to rule out hyperparathyroidism.  Will expect to see the PTH suppressed if it is vitamin D oversupplementation.

## 2023-02-15 NOTE — Assessment & Plan Note (Signed)
Mild subconjunctival hemorrhage of the right eye, medial aspect.  Vision is normal in that eye.  No pain.  Probably related to recent coughing from a viral URI.  No treatment necessary.

## 2023-02-15 NOTE — Addendum Note (Signed)
Addended by: Erlinda Hong T on: 02/15/2023 10:39 AM   Modules accepted: Level of Service

## 2023-02-15 NOTE — Addendum Note (Signed)
Addended by: Bufford Spikes on: 02/15/2023 11:02 AM   Modules accepted: Orders

## 2023-02-15 NOTE — Progress Notes (Signed)
Established Patient Office Visit  Subjective   Patient ID: Robert Park, male    DOB: 1950/04/08  Age: 73 y.o. MRN: [id number]   HPI  73 year old person here for management of diabetes and hypertension.  Doing very well since I last saw him.  Losing excellent amount of weight on Ozempic.  Tolerating that medication well.  Having some occasional bloating, constipation, and nausea, but can tolerate this current dose.  No fevers or chills.  Had some recent coughing from a viral URI and then reported a right subconjunctival hemorrhage that occurred over the last 1 day.  Vision is fine.  No pain in his eye.  Some pressure around the sinuses.  No chest pain or dyspnea with exertion.  Continues to work part-time.  Not limited by anything currently.  Good adherence with medications and no side effects.    Objective:     BP 138/82 (BP Location: Right Arm, Patient Position: Sitting, Cuff Size: Small)   Pulse 78   Temp 98.7 F (37.1 C) (Oral)   Ht 5\' 7"  (1.702 m)   Wt 207 lb 12.8 oz (94.3 kg)   SpO2 98%   BMI 32.55 kg/m    Physical Exam  General: Well-appearing CV: Regular rate and rhythm, 2 out of 6 early systolic murmur best heard at the left upper sternal border Lungs: Clear to auscultation throughout, normal percussion Extremities: Warm and well-perfused with no lower extremity edema Eye: Right medial aspect has a subconjunctival hemorrhage, IOL in place, no swelling around the eyelid, normal EOM    Assessment & Plan:   Problem List Items Addressed This Visit       High   Hypertension (Chronic)   Blood pressure well-controlled today.  Chronic and stable.  Plan to continue losartan 50 mg daily.      Relevant Medications   tadalafil (CIALIS) 10 MG tablet   Diabetes (HCC) - Primary (Chronic)   Chronic and stable.  Very well-controlled with an A1c of 6.3%.  Plan to continue with Ozempic 1 mg weekly, tolerating this well.  Having some GI side effects so will not increase to the  2 mg dose.  Enjoying very nice weight loss, currently down to 207 pounds with a BMI of 32.  Has lost about 30 pounds.  Will continue losartan for renal protection.  Referral to ophthalmology today to screen for diabetic retinopathy, at low risk for this.      Relevant Orders   POC Hbg A1C (Completed)   Ambulatory referral to Ophthalmology     Medium    Mild persistent asthma, uncomplicated (Chronic)   Asymptomatic currently.  Patient stopped using Arnuity, currently only using albuterol as needed, which is a rare event.  If he starts using albuterol more frequently, will talk to him about anti-inflammatory reliever therapy.        Unprioritized   Hypercalcemia   Asymptomatic hypercalcemia noted on last 2 blood draws.  Most likely cause is oversupplementation with high-dose vitamin D.  He is been using this for few years now partly because insurance coverage is better for this high dose version than a regular supplements.  I am going to check his calcium levels today, check vitamin D, and check PTH to rule out hyperparathyroidism.  Will expect to see the PTH suppressed if it is vitamin D oversupplementation.      Relevant Orders   Vitamin D (25 hydroxy)   CMP14 + Anion Gap   Phosphorus   Parathyroid Hormone, Intact w/Ca  Subconjunctival hemorrhage of right eye   Mild subconjunctival hemorrhage of the right eye, medial aspect.  Vision is normal in that eye.  No pain.  Probably related to recent coughing from a viral URI.  No treatment necessary.       Return in about 3 months (around 05/15/2023).    Tyson Alias, MD

## 2023-02-15 NOTE — Assessment & Plan Note (Signed)
Chronic and stable.  Very well-controlled with an A1c of 6.3%.  Plan to continue with Ozempic 1 mg weekly, tolerating this well.  Having some GI side effects so will not increase to the 2 mg dose.  Enjoying very nice weight loss, currently down to 207 pounds with a BMI of 32.  Has lost about 30 pounds.  Will continue losartan for renal protection.  Referral to ophthalmology today to screen for diabetic retinopathy, at low risk for this.

## 2023-02-15 NOTE — Assessment & Plan Note (Signed)
Blood pressure well-controlled today.  Chronic and stable.  Plan to continue losartan 50 mg daily.

## 2023-02-15 NOTE — Assessment & Plan Note (Signed)
Asymptomatic currently.  Patient stopped using Arnuity, currently only using albuterol as needed, which is a rare event.  If he starts using albuterol more frequently, will talk to him about anti-inflammatory reliever therapy.

## 2023-02-16 LAB — CMP14 + ANION GAP
ALT: 19 [IU]/L (ref 0–44)
AST: 16 [IU]/L (ref 0–40)
Albumin: 4.8 g/dL (ref 3.8–4.8)
Alkaline Phosphatase: 126 [IU]/L — ABNORMAL HIGH (ref 44–121)
Anion Gap: 18 mmol/L (ref 10.0–18.0)
BUN/Creatinine Ratio: 15 (ref 10–24)
BUN: 15 mg/dL (ref 8–27)
Bilirubin Total: 0.5 mg/dL (ref 0.0–1.2)
CO2: 22 mmol/L (ref 20–29)
Calcium: 10.4 mg/dL — ABNORMAL HIGH (ref 8.6–10.2)
Chloride: 98 mmol/L (ref 96–106)
Creatinine, Ser: 1.03 mg/dL (ref 0.76–1.27)
Globulin, Total: 2.3 g/dL (ref 1.5–4.5)
Glucose: 114 mg/dL — ABNORMAL HIGH (ref 70–99)
Potassium: 5.1 mmol/L (ref 3.5–5.2)
Sodium: 138 mmol/L (ref 134–144)
Total Protein: 7.1 g/dL (ref 6.0–8.5)
eGFR: 78 mL/min/{1.73_m2} (ref >59)

## 2023-02-16 LAB — VITAMIN D 25 HYDROXY (VIT D DEFICIENCY, FRACTURES): Vit D, 25-Hydroxy: 52.2 ng/mL (ref 30.0–100.0)

## 2023-02-16 LAB — PHOSPHORUS: Phosphorus: 3.3 mg/dL (ref 2.8–4.1)

## 2023-02-16 LAB — PARATHYROID HORMONE, INTACT (NO CA): PTH: 17 pg/mL (ref 15–65)

## 2023-02-17 ENCOUNTER — Telehealth: Payer: Self-pay

## 2023-02-17 NOTE — Telephone Encounter (Signed)
 Requesting lab results, please call pt back.

## 2023-02-18 NOTE — Telephone Encounter (Signed)
 I called patient twice yesterday, went straight to VM, and I left a message. Sorry I couldn't get in touch with him.

## 2023-02-22 ENCOUNTER — Other Ambulatory Visit (HOSPITAL_COMMUNITY): Payer: Self-pay

## 2023-02-22 ENCOUNTER — Other Ambulatory Visit: Payer: Self-pay

## 2023-02-22 NOTE — Progress Notes (Signed)
 Specialty Pharmacy Refill Coordination Note  Robert Park is a 73 y.o. male assessed today regarding refills of clinic administered specialty medication(s) Cabotegravir  & Rilpivirine  (CABENUVA )   Clinic requested Courier to Provider Office   Delivery date: 03/08/23   Verified address: [address] [place] [address]   Medication will be filled on 03/05/23.

## 2023-02-23 ENCOUNTER — Encounter: Payer: Self-pay | Admitting: Student in an Organized Health Care Education/Training Program

## 2023-02-26 ENCOUNTER — Telehealth: Payer: Self-pay | Admitting: Gastroenterology

## 2023-02-26 NOTE — Telephone Encounter (Signed)
Please see note below and advise on the status of PA for Humira for pt.

## 2023-02-26 NOTE — Telephone Encounter (Signed)
Patient called and stated that he was wanting to know the status of his prior authorization for his Humira. Patient stated that [employer] sent over 2 fax for the prior authorization. Patient also stated that he was low on his Humira. Please advise.

## 2023-03-05 ENCOUNTER — Other Ambulatory Visit (HOSPITAL_COMMUNITY): Payer: Self-pay

## 2023-03-08 ENCOUNTER — Telehealth: Payer: Self-pay

## 2023-03-08 ENCOUNTER — Other Ambulatory Visit: Payer: Self-pay

## 2023-03-08 ENCOUNTER — Other Ambulatory Visit (HOSPITAL_COMMUNITY): Payer: Self-pay

## 2023-03-08 NOTE — Telephone Encounter (Signed)
 [facility] Patient Advocate Encounter   I was successful in securing patient a $ 2000.00 grant from [employer] to provide copayment coverage for Cabenuva.  The patient's out of pocket cost will be $5.00 monthly.     I have spoken with the patient.    The billing information is as follows and has been shared with [facility].         Dates of Eligibility: 03/08/23 through 01/12/24  Patient knows to call the office with questions or concerns.  Clearance Coots, CPhT Specialty Pharmacy Patient [facility] for Infectious Disease Phone: [phone] Fax:  [phone]

## 2023-03-09 ENCOUNTER — Other Ambulatory Visit: Payer: Self-pay

## 2023-03-10 ENCOUNTER — Telehealth: Payer: Self-pay

## 2023-03-10 NOTE — Telephone Encounter (Signed)
 [facility] Patient Advocate Encounter  Patient's medications CABENUVA have been couriered to [facility] from [facility] and will be administered at the patients appointment on 03/12/23.  Kae Heller, CPhT Specialty Pharmacy Patient [facility] for Infectious Disease Phone: [phone] Fax:  [phone]

## 2023-03-12 ENCOUNTER — Other Ambulatory Visit: Payer: Self-pay

## 2023-03-12 ENCOUNTER — Ambulatory Visit: Payer: Medicare Other | Admitting: Family

## 2023-03-12 ENCOUNTER — Telehealth: Payer: Medicare Other | Admitting: Family

## 2023-03-12 ENCOUNTER — Encounter: Payer: Self-pay | Admitting: Family

## 2023-03-12 VITALS — BP 142/85 | HR 69 | Temp 98.5°F | Wt 211.6 lb

## 2023-03-12 DIAGNOSIS — B20 Human immunodeficiency virus [HIV] disease: Secondary | ICD-10-CM | POA: Diagnosis not present

## 2023-03-12 DIAGNOSIS — Z79899 Other long term (current) drug therapy: Secondary | ICD-10-CM | POA: Diagnosis not present

## 2023-03-12 MED ORDER — CABOTEGRAVIR & RILPIVIRINE ER 600 & 900 MG/3ML IM SUER
1.0000 | Freq: Once | INTRAMUSCULAR | Status: AC
  Administered 2023-03-12: 1 via INTRAMUSCULAR

## 2023-03-12 NOTE — Progress Notes (Signed)
    Brief Narrative   Patient ID: Robert Park, male    DOB: 05-19-1950, 73 y.o.   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  Previous history of CMV colitis in 2010-2011.  Initial CD4 count of 450 with viral load of 21,000.  Entered care and CDC stage II.  [id number] negative. Previous medications include Atripla and Genovya.  Currently on Cabenuva.    Subjective:    Chief Complaint  Patient presents with   Follow-up    Brief Progress Note:  Robert Park has been doing well on Cabenuva with well controlled virus and good tolerance to medication. Unable to visit with him today, but did receive his Cabenuva and will check lab work.  Continue current dose of Cabenuva.   Marcos Eke, NP 03/12/2023 11:33 AM

## 2023-03-15 ENCOUNTER — Other Ambulatory Visit (HOSPITAL_COMMUNITY): Payer: Self-pay

## 2023-03-15 ENCOUNTER — Telehealth: Payer: Self-pay | Admitting: Pharmacy Technician

## 2023-03-15 LAB — LIPID PANEL
Cholesterol: 259 mg/dL — ABNORMAL HIGH (ref <200)
HDL: 60 mg/dL (ref >=40)
LDL Cholesterol (Calc): 158 mg/dL — ABNORMAL HIGH
Non-HDL Cholesterol (Calc): 199 mg/dL — ABNORMAL HIGH (ref <130)
Total CHOL/HDL Ratio: 4.3 (calc) (ref <5.0)
Triglycerides: 239 mg/dL — ABNORMAL HIGH (ref <150)

## 2023-03-15 LAB — T-HELPER CELLS (CD4) COUNT (NOT AT ARMC)
Absolute CD4: 724 {cells}/uL (ref 490–1740)
CD4 T Helper %: 36 % (ref 30–61)
Total lymphocyte count: 2037 {cells}/uL (ref 850–3900)

## 2023-03-15 LAB — HIV-1 RNA QUANT-NO REFLEX-BLD
HIV 1 RNA Quant: NOT DETECTED {copies}/mL
HIV-1 RNA Quant, Log: NOT DETECTED {Log_copies}/mL

## 2023-03-15 NOTE — Telephone Encounter (Signed)
 PT called for an update on Humira. He has 1 shot left and needs to know why he has not received a new prescription. Requesting call back.

## 2023-03-15 NOTE — Telephone Encounter (Signed)
 PT returning call. He wanted to give an update that he is completely out of Humira. He is very upset that after a month, he still does not have this medication. Please advise. He is also very upset that he cannot see Dr. Myrtie Neither for his future appointment and seeking legal counsel regarding all of this.

## 2023-03-15 NOTE — Telephone Encounter (Signed)
 Pharmacy Patient Advocate Encounter   Received notification from CoverMyMeds that prior authorization for HUMIRA 40MG  is required/requested.   Insurance verification completed.   The patient is insured through [facility] .   Per test claim: PA required; PA submitted to above mentioned insurance via CoverMyMeds Key/confirmation #/EOC [id number] Status is pending

## 2023-03-16 ENCOUNTER — Ambulatory Visit: Payer: Medicare Other | Admitting: Allergy & Immunology

## 2023-03-16 ENCOUNTER — Encounter: Payer: Self-pay | Admitting: Allergy & Immunology

## 2023-03-16 ENCOUNTER — Other Ambulatory Visit: Payer: Self-pay

## 2023-03-16 ENCOUNTER — Telehealth: Payer: Self-pay

## 2023-03-16 VITALS — BP 128/82 | HR 82 | Temp 97.8°F | Resp 16 | Ht 67.0 in | Wt 211.0 lb

## 2023-03-16 DIAGNOSIS — J4530 Mild persistent asthma, uncomplicated: Secondary | ICD-10-CM | POA: Diagnosis not present

## 2023-03-16 DIAGNOSIS — J3089 Other allergic rhinitis: Secondary | ICD-10-CM

## 2023-03-16 DIAGNOSIS — J302 Other seasonal allergic rhinitis: Secondary | ICD-10-CM | POA: Diagnosis not present

## 2023-03-16 DIAGNOSIS — G4733 Obstructive sleep apnea (adult) (pediatric): Secondary | ICD-10-CM

## 2023-03-16 MED ORDER — FLUTICASONE PROPIONATE HFA 110 MCG/ACT IN AERO: 2.0000 | INHALATION_SPRAY | Freq: Two times a day (BID) | RESPIRATORY_TRACT | 3 refills | Status: AC

## 2023-03-16 MED ORDER — CETIRIZINE HCL 10 MG PO TABS: 10.0000 mg | ORAL_TABLET | Freq: Every day | ORAL | 3 refills | Status: DC

## 2023-03-16 MED ORDER — FLUTICASONE PROPIONATE 50 MCG/ACT NA SUSP: 1.0000 | Freq: Every day | NASAL | 3 refills | Status: AC

## 2023-03-16 MED ORDER — OLOPATADINE HCL 0.6 % NA SOLN: 1.0000 | Freq: Every day | NASAL | 3 refills | Status: AC

## 2023-03-16 MED ORDER — ALBUTEROL SULFATE HFA 108 (90 BASE) MCG/ACT IN AERS: INHALATION_SPRAY | RESPIRATORY_TRACT | 2 refills | Status: DC

## 2023-03-16 NOTE — Telephone Encounter (Signed)
 Received a fax from [facility] requesting completion of page 5 (Enrollment and Prescription form). Page 5 was completed as requested with provider information (Dr. Amada Jupiter), office contact, and prescriptions for Humira maintenance dose.   Page 5 has been faxed back to [employer] along with patient's medication allergies and concomitant medications.  [employer] P: [phone] F: [phone]

## 2023-03-16 NOTE — Telephone Encounter (Signed)
 Pt returned call. I apologized to the patient for the delay in someone contacting him. I informed patient of the information that I received from [employer]. I told pt that I will send him a MyChart message once I fax back provider portion of [employer] application. Pt prefers to have an appointment with Dr. Myrtie Neither so his appt has been rescheduled to Tuesday, 05/11/23 at 8:40 am with Dr. Myrtie Neither. Pt verbalized understanding and had no concerns at the end of the call.

## 2023-03-16 NOTE — Progress Notes (Signed)
 FOLLOW UP  Date of Service/Encounter:  03/16/23   Assessment:   Mild persistent asthma, uncomplicated - with much better control since starting the Flovent    Seasonal and perennial allergic rhinitis (grasses, ragweed, weeds, trees, cat, and dog)   Complicated past medical history including well-controlled HIV as well as ulcerative colitis  Plan/Recommendations:   1. Mild persistent asthma, uncomplicated - Lung testing looks amazing today! - I am not going to make any changes at all today.  - I sent in 69-month supplies for your Flovent.  - Daily controller medication(s): Flovent 2 puffs twice daily with spacer - Prior to physical activity: albuterol 2 puffs 10-15 minutes before physical activity. - Rescue medications: albuterol 4 puffs every 4-6 hours as needed - Changes during respiratory infections or worsening symptoms: Increase Flovent to 4 puffs twice daily for TWO WEEKS. - Asthma control goals:  * Full participation in all desired activities (may need albuterol before activity) * Albuterol use two time or less a week on average (not counting use with activity) * Cough interfering with sleep two time or less a month * Oral steroids no more than once a year * No hospitalizations  2. Chronic rhinitis (grasses, ragweed, weeds, trees, cat, and dog) - It seems that everything is well controlled. - I removed the Singulair from your medical list since you reacted badly to it.  - Continue with: Zyrtec (cetirizine) 10mg  tablet 1-2 times daily and Flonase (fluticasone) two sprays per nostril daily and Patanase (olopatadine) two sprays per nostril 1-2 times daily as needed - You can use an extra dose of the antihistamine, if needed, for breakthrough symptoms.  - Consider nasal saline rinses 1-2 times daily to remove allergens from the nasal cavities as well as help with mucous clearance (this is especially helpful to do before the nasal sprays are given) - Consider  allergy shots as a means of long-term control.  3. Return in about 6 months (around 09/16/2023). You can have the follow up appointment with Dr. Dellis Anes or a Nurse Practicioner (our Nurse Practitioners are excellent and always have Physician oversight!).   Subjective:   Robert Park is a 73 y.o. male presenting today for follow up of  Chief Complaint  Patient presents with   Asthma   Allergies    Robert Park has a history of the following: Patient Active Problem List   Diagnosis Date Noted   Hypercalcemia 02/15/2023   Subconjunctival hemorrhage of right eye 02/15/2023   Mild persistent asthma, uncomplicated 07/02/2020   Seasonal and perennial allergic rhinitis 07/02/2020   Allergic sinusitis 02/05/2020   Hypertension 08/07/2019   Diabetes (HCC) 08/07/2019   Hyperlipidemia 08/07/2019   GAD (generalized anxiety disorder) 08/07/2019   Localized osteoarthritis of left hand 08/07/2019   Colon polyps 06/06/2019   Human immunodeficiency virus (HIV) disease (HCC) 02/16/2019   Screening for STDs (sexually transmitted diseases) 02/16/2019   Osteopenia 02/16/2019   Healthcare maintenance 02/16/2019   Ulcerative colitis without complications (HCC) 02/16/2019    History obtained from: chart review and patient.  Discussed the use of AI scribe software for clinical note transcription with the patient and/or guardian, who gave verbal consent to proceed.  Robert Park is a 73 y.o. male presenting for a follow up visit.  He was last seen in January 2024.  At that time, like testing looked amazing.  We continue with Flovent 110 mcg 2 puffs twice daily as well as albuterol as needed.  For her rhinitis, he was  doing great with Zyrtec as well as Singulair and Patanase.  He also remained on Flonase.  Since last visit,   Asthma/Respiratory Symptom History: Additionally, he uses a Flovent inhaler, two puffs twice a day as needed. He has discontinued montelukast due to side effects.  He has not been  using his rescue inhaler very frequently.  He has not been on prednisone for symptoms. He experiences good lung function and maintains regular exercise.   Allergic Rhinitis Symptom History: He has experienced a relatively good year for his allergies last year but anticipates a more challenging season this year. He uses Flonase and cetirizine intermittently and finds an over-the-counter inhaler helpful for calming his cough. No recent antibiotic use except for over-the-counter medication for a viral sinus infection a couple of months ago.   He was on Ozempic until three weeks ago but stopped due to cost issues with his Medicare Advantage plan. While on Ozempic, he experienced severe constipation and diarrhea, which he felt counteracted the benefits of Humira for his ulcerative colitis. He lost 30 pounds while on Ozempic but has since regained five pounds. He monitors his diet closely due to ulcerative colitis and reports his sugar levels were normal on a 0.5 dosage of Ozempic. He has been on Humira since late 2017, which has significantly improved his quality of life after seven years of severe symptoms.  He experiences arthritic pain and is scheduled for an MRI of his shoulder due to pain and stiffness over the last six to eight months. He has had carpal tunnel surgery and receives cortisone shots for pain management, preferring them over surgery.  He is planning a 17-day trip to [place] in May and is trying to maintain his health and manage stress. He exercises regularly and is mindful of his diet, avoiding stress from news consumption.     Otherwise, there have been no changes to his past medical history, surgical history, family history, or social history.    Review of systems otherwise negative other than that mentioned in the HPI.    Objective:   Blood pressure 128/82, pulse 82, temperature 97.8 F (36.6 C), temperature source Temporal, resp. rate 16, height 5\' 7"  (1.702 m), weight 211 lb  (95.7 kg), SpO2 96%. Body mass index is 33.05 kg/m.    Physical Exam Vitals reviewed.  Constitutional:      Appearance: He is well-developed.     Comments: Very lovely and talkative.  HENT:     Head: Normocephalic and atraumatic.     Right Ear: Tympanic membrane, ear canal and external ear normal.     Left Ear: Tympanic membrane, ear canal and external ear normal.     Nose: No nasal deformity, septal deviation, mucosal edema or rhinorrhea.     Right Turbinates: Enlarged, swollen and pale.     Left Turbinates: Enlarged, swollen and pale.     Right Sinus: No maxillary sinus tenderness or frontal sinus tenderness.     Left Sinus: No maxillary sinus tenderness or frontal sinus tenderness.     Comments: No nasal polyps.    Mouth/Throat:     Mouth: Mucous membranes are not pale and not dry.     Pharynx: Uvula midline.  Eyes:     General: Lids are normal. Allergic shiner present.        Right eye: No discharge.        Left eye: No discharge.     Conjunctiva/sclera: Conjunctivae normal.     Right eye: Right conjunctiva  is not injected. No chemosis.    Left eye: Left conjunctiva is not injected. No chemosis.    Pupils: Pupils are equal, round, and reactive to light.  Cardiovascular:     Rate and Rhythm: Normal rate and regular rhythm.     Heart sounds: Normal heart sounds.  Pulmonary:     Effort: Pulmonary effort is normal. No tachypnea, accessory muscle usage or respiratory distress.     Breath sounds: Normal breath sounds. No wheezing, rhonchi or rales.     Comments: Moving air well in all lung fields.  Chest:     Chest wall: No tenderness.  Lymphadenopathy:     Cervical: No cervical adenopathy.  Skin:    Coloration: Skin is not pale.     Findings: No abrasion, erythema, petechiae or rash. Rash is not papular, urticarial or vesicular.  Neurological:     Mental Status: He is alert.  Psychiatric:        Behavior: Behavior is cooperative.      Diagnostic studies:     Spirometry: results normal (FEV1: 3.20/112%, FVC: 4.06/108%, FEV1/FVC: 79%).    Spirometry consistent with normal pattern.   Allergy Studies: none        Malachi Bonds, MD  [facility]

## 2023-03-16 NOTE — Patient Instructions (Signed)
 1. Mild persistent asthma, uncomplicated - Lung testing looks amazing today! - I am not going to make any changes at all today.  - I sent in 39-month supplies for your Flovent.  - Daily controller medication(s): Flovent 2 puffs twice daily with spacer - Prior to physical activity: albuterol 2 puffs 10-15 minutes before physical activity. - Rescue medications: albuterol 4 puffs every 4-6 hours as needed - Changes during respiratory infections or worsening symptoms: Increase Flovent to 4 puffs twice daily for TWO WEEKS. - Asthma control goals:  * Full participation in all desired activities (may need albuterol before activity) * Albuterol use two time or less a week on average (not counting use with activity) * Cough interfering with sleep two time or less a month * Oral steroids no more than once a year * No hospitalizations  2. Chronic rhinitis (grasses, ragweed, weeds, trees, cat, and dog) - It seems that everything is well controlled. - I removed the Singulair from your medical list since you reacted badly to it.  - Continue with: Zyrtec (cetirizine) 10mg  tablet 1-2 times daily and Flonase (fluticasone) two sprays per nostril daily and Patanase (olopatadine) two sprays per nostril 1-2 times daily as needed - You can use an extra dose of the antihistamine, if needed, for breakthrough symptoms.  - Consider nasal saline rinses 1-2 times daily to remove allergens from the nasal cavities as well as help with mucous clearance (this is especially helpful to do before the nasal sprays are given) - Consider allergy shots as a means of long-term control.  3. Return in about 6 months (around 09/16/2023). You can have the follow up appointment with Dr. Dellis Anes or a Nurse Practicioner (our Nurse Practitioners are excellent and always have Physician oversight!).    Please inform us of any Emergency Department visits, hospitalizations, or changes in symptoms. Call us before going to the ED  for breathing or allergy symptoms since we might be able to fit you in for a sick visit. Feel free to contact us anytime with any questions, problems, or concerns.  It was a pleasure to see you again today! Have fun with your [place] trip!   Websites that have reliable patient information: 1. American Academy of Asthma, Allergy, and Immunology: www.aaaai.org 2. Food Allergy Research and Education (FARE): foodallergy.org 3. Mothers of Asthmatics: http://www.asthmacommunitynetwork.org 4. American College of Allergy, Asthma, and Immunology: www.acaai.org      "Like" [place] on Facebook and Instagram for our latest updates!      A healthy democracy works best when [employer] participate! Make sure you are registered to vote! If you have moved or changed any of your contact information, you will need to get this updated before voting! Scan the QR codes below to learn more!

## 2023-03-16 NOTE — Telephone Encounter (Addendum)
 Called [employer] assist [phone] to follow up on assistance for patient's Humira prescription. I spoke with Baxter Hire who informed me that patient submitted application renewal in January 2025 and they faxed over the provider portion for [place] to complete at that time. They only faxed it once. I informed Baxter Hire that I have not seen the fax and asked that she refax to my attention at [phone]. Turn around time will be about 7-10 business days, pt may be late for his next injection. Application renewal is required yearly for patient. Will await fax from [employer].   Lm on vm for patient to return call.

## 2023-03-18 ENCOUNTER — Other Ambulatory Visit (HOSPITAL_COMMUNITY): Payer: Self-pay

## 2023-03-26 DIAGNOSIS — M180 Bilateral primary osteoarthritis of first carpometacarpal joints: Secondary | ICD-10-CM | POA: Diagnosis not present

## 2023-04-03 DIAGNOSIS — M25511 Pain in right shoulder: Secondary | ICD-10-CM | POA: Diagnosis not present

## 2023-04-06 ENCOUNTER — Ambulatory Visit: Payer: Medicare Other | Admitting: Physician Assistant

## 2023-04-08 DIAGNOSIS — M75101 Unspecified rotator cuff tear or rupture of right shoulder, not specified as traumatic: Secondary | ICD-10-CM | POA: Diagnosis not present

## 2023-04-08 DIAGNOSIS — M19011 Primary osteoarthritis, right shoulder: Secondary | ICD-10-CM | POA: Diagnosis not present

## 2023-04-08 DIAGNOSIS — M13841 Other specified arthritis, right hand: Secondary | ICD-10-CM | POA: Diagnosis not present

## 2023-04-08 NOTE — Telephone Encounter (Signed)
 Pharmacy Patient Advocate Encounter  Received notification from [facility] Medicare Part D that Prior Authorization for Humira (2 Pen) (CF) 40MG /0.4ML auto-injector kit has been APPROVED from 04-04-2023 to 06-07-2023   PA #/Case ID/Reference #: [id number]

## 2023-04-12 NOTE — Telephone Encounter (Signed)
 Noted.

## 2023-04-12 NOTE — Telephone Encounter (Signed)
 Noted the pt has been advised and received medication

## 2023-04-21 ENCOUNTER — Other Ambulatory Visit (HOSPITAL_COMMUNITY): Payer: Self-pay

## 2023-04-22 ENCOUNTER — Other Ambulatory Visit: Payer: Self-pay

## 2023-04-22 ENCOUNTER — Other Ambulatory Visit: Payer: Self-pay | Admitting: Pharmacist

## 2023-04-22 ENCOUNTER — Other Ambulatory Visit (HOSPITAL_COMMUNITY): Payer: Self-pay

## 2023-04-22 DIAGNOSIS — B20 Human immunodeficiency virus [HIV] disease: Secondary | ICD-10-CM

## 2023-04-22 MED ORDER — CABOTEGRAVIR & RILPIVIRINE ER 600 & 900 MG/3ML IM SUER
1.0000 | INTRAMUSCULAR | 5 refills | Status: AC
  Filled 2023-04-22: qty 6, 60d supply, fill #0
  Filled 2023-06-22: qty 6, 60d supply, fill #1
  Filled 2023-08-26: qty 6, 60d supply, fill #2
  Filled 2023-10-27: qty 6, 60d supply, fill #3
  Filled 2024-01-04: qty 6, 60d supply, fill #4

## 2023-04-22 NOTE — Progress Notes (Signed)
 Specialty Pharmacy Refill Coordination Note  Robert Park is a 73 y.o. male assessed today regarding refills of clinic administered specialty medication(s) Cabotegravir & Rilpivirine [facility])   Clinic requested Courier to Provider Office   Delivery date: 04/28/23   Verified address: [address] [place] [address]   Medication will be filled on 04/27/23.

## 2023-04-28 ENCOUNTER — Telehealth: Payer: Self-pay

## 2023-04-28 NOTE — Telephone Encounter (Signed)
 [facility] Patient Advocate Encounter  Patient's medications CABENUVA have been couriered to [facility] from [facility] and will be administered at the patients appointment on 05/05/23.  Verline Glow, CPhT Specialty Pharmacy Patient [facility] Phone: [phone] Fax:  [phone]

## 2023-05-04 NOTE — Progress Notes (Unsigned)
 HPI: Robert Park is a 73 y.o. male who presents to the [facility] pharmacy clinic for Cabenuva  administration.  Patient Active Problem List   Diagnosis Date Noted   Hypercalcemia 02/15/2023   Subconjunctival hemorrhage of right eye 02/15/2023   Mild persistent asthma, uncomplicated 07/02/2020   Seasonal and perennial allergic rhinitis 07/02/2020   Allergic sinusitis 02/05/2020   Hypertension 08/07/2019   Diabetes (HCC) 08/07/2019   Hyperlipidemia 08/07/2019   GAD (generalized anxiety disorder) 08/07/2019   Localized osteoarthritis of left hand 08/07/2019   Colon polyps 06/06/2019   Human immunodeficiency virus (HIV) disease (HCC) 02/16/2019   Screening for STDs (sexually transmitted diseases) 02/16/2019   Osteopenia 02/16/2019   Healthcare maintenance 02/16/2019   Ulcerative colitis without complications (HCC) 02/16/2019    Patient's Medications  New Prescriptions   No medications on file  Previous Medications   ADALIMUMAB  (HUMIRA ) 40 MG/0.4ML PSKT    Inject 1 pen  into the skin every 14 (fourteen) days.   ALBUTEROL  (VENTOLIN  HFA) 108 (90 BASE) MCG/ACT INHALER    Inhale two puffs every 4-6 hours if needed for cough or wheeze   CABOTEGRAVIR  & RILPIVIRINE  ER (CABENUVA ) 600 & 900 MG/3ML INJECTION    Inject 1 kit into the muscle every 2 (two) months.   CETIRIZINE  (ZYRTEC ) 10 MG TABLET    Take 1 tablet (10 mg total) by mouth daily.   DULOXETINE  (CYMBALTA ) 30 MG CAPSULE    Take 1 capsule (30 mg total) by mouth daily.   EZETIMIBE  (ZETIA ) 10 MG TABLET    Take 1 tablet (10 mg total) by mouth daily.   FLUTICASONE  (FLONASE ) 50 MCG/ACT NASAL SPRAY    Place 1 spray into both nostrils daily.   FLUTICASONE  (FLOVENT  HFA) 110 MCG/ACT INHALER    Inhale 2 puffs into the lungs in the morning and at bedtime.   LOSARTAN  (COZAAR ) 50 MG TABLET    Take 1 tablet (50 mg total) by mouth daily.   OLOPATADINE  HCL 0.6 % SOLN    Place 1 spray into the nose daily.   SEMAGLUTIDE , 1 MG/DOSE, 4 MG/3ML SOPN    Inject  1 mg into the skin once a week.   TADALAFIL  (CIALIS ) 10 MG TABLET    Take 0.5 tablets (5 mg total) by mouth daily as needed for erectile dysfunction.   VITAMIN D , ERGOCALCIFEROL , (DRISDOL ) 1.25 MG (50000 UNIT) CAPS CAPSULE    Take 1 capsule (50,000 Units total) by mouth every 7 (seven) days.  Modified Medications   No medications on file  Discontinued Medications   No medications on file    Allergies: Allergies  Allergen Reactions   Elemental Sulfur    Prednisone    Statins     Muscle pains, GI symptoms   Sulfamethoxazole-Trimethoprim     Labs: Lab Results  Component Value Date   HIV1RNAQUANT Not Detected 03/12/2023   HIV1RNAQUANT Not Detected 09/21/2022   HIV1RNAQUANT Not Detected 05/08/2022   CD4TABS 737 09/21/2022   CD4TABS 757 11/11/2021   CD4TABS 962 09/17/2020    RPR and STI Lab Results  Component Value Date   LABRPR REACTIVE (A) 03/11/2021   LABRPR REACTIVE (A) 08/26/2020   LABRPR REACTIVE (A) 07/16/2020   LABRPR REACTIVE (A) 02/23/2020   LABRPR NON-REACTIVE 05/16/2019   RPRTITER 1:2 (H) 03/11/2021   RPRTITER 1:4 (H) 08/26/2020   RPRTITER 1:4 (H) 07/16/2020   RPRTITER 1:32 (H) 02/23/2020   RPRTITER 1:1 (H) 02/01/2019    STI Results GC CT  05/08/2021  2:03 PM Negative  Negative    Negative  Negative    Negative    Negative   08/26/2020  4:05 PM Negative    Positive    Positive  Negative    Negative    Negative   02/01/2019  2:30 PM Negative  Negative     Hepatitis B Lab Results  Component Value Date   HEPBSAB REACTIVE (A) 02/01/2019   HEPBSAG NON-REACTIVE 02/01/2019   HEPBCAB NON-REACTIVE 02/01/2019   Hepatitis C Lab Results  Component Value Date   HEPCAB NON-REACTIVE 02/01/2019   Hepatitis A Lab Results  Component Value Date   HAV REACTIVE (A) 02/01/2019   Lipids: Lab Results  Component Value Date   CHOL 259 (H) 03/12/2023   TRIG 239 (H) 03/12/2023   HDL 60 03/12/2023   CHOLHDL 4.3 03/12/2023   LDLCALC 158 (H) 03/12/2023     TARGET DATE: 29  Assessment: Bill presents today for 4mo maintenance Cabenuva  injections. Past injections were tolerated well without issues. Last HIV RNA was undetectable in February 2025. Doing well with no issues today.  Administered cabotegravir  600mg /70mL in left upper outer quadrant of the gluteal muscle. Administered rilpivirine  900 mg/3mL in the right upper outer quadrant of the gluteal muscle. No issues with injections. Raenette Bumps will follow up in 2 months for next set of injections.  Patient reports he got the second of Shingrix dose at [facility].  Plan: - Cabenuva  injections administered - Next injections scheduled for June 25 with Mylinda Asa - Call with any issues or questions  Kristopher Pheasant PharmD Candidate

## 2023-05-05 ENCOUNTER — Encounter: Payer: Self-pay | Admitting: Student in an Organized Health Care Education/Training Program

## 2023-05-05 ENCOUNTER — Ambulatory Visit: Admitting: Student in an Organized Health Care Education/Training Program

## 2023-05-05 ENCOUNTER — Other Ambulatory Visit: Payer: Self-pay

## 2023-05-05 ENCOUNTER — Ambulatory Visit: Payer: Self-pay | Admitting: Pharmacist

## 2023-05-05 VITALS — BP 150/100 | HR 72 | Wt 221.0 lb

## 2023-05-05 DIAGNOSIS — I10 Essential (primary) hypertension: Secondary | ICD-10-CM

## 2023-05-05 DIAGNOSIS — B20 Human immunodeficiency virus [HIV] disease: Secondary | ICD-10-CM | POA: Diagnosis not present

## 2023-05-05 DIAGNOSIS — Z7984 Long term (current) use of oral hypoglycemic drugs: Secondary | ICD-10-CM

## 2023-05-05 DIAGNOSIS — E119 Type 2 diabetes mellitus without complications: Secondary | ICD-10-CM | POA: Diagnosis not present

## 2023-05-05 LAB — POCT GLYCOSYLATED HEMOGLOBIN (HGB A1C): Hemoglobin A1C: 7.4 % — AB (ref 4.0–5.6)

## 2023-05-05 MED ORDER — CABOTEGRAVIR & RILPIVIRINE ER 600 & 900 MG/3ML IM SUER
1.0000 | Freq: Once | INTRAMUSCULAR | Status: AC
Start: 2023-05-05 — End: 2023-05-05
  Administered 2023-05-05: 1 via INTRAMUSCULAR

## 2023-05-05 MED ORDER — SEMAGLUTIDE(0.25 OR 0.5MG/DOS) 2 MG/3ML ~~LOC~~ SOPN
0.2500 mg | PEN_INJECTOR | SUBCUTANEOUS | 1 refills | Status: DC
Start: 2023-05-05 — End: 2023-09-03

## 2023-05-05 NOTE — Assessment & Plan Note (Signed)
 Blood pressure is elevated today above goal.  He is tolerating losartan  50 mg daily.  He had to come down on this dose a few years ago because of episodes of hypotension.  He also has a cruise to [place] coming up in a few weeks, so we decided today was not the right day to make a change to his blood pressure medications.  He is going follow-up with me in 3 months and we will recheck blood pressure then.  If still elevated we can talk about either adding a thiazide or increasing the dose of losartan .

## 2023-05-05 NOTE — Assessment & Plan Note (Signed)
 Chronic condition was worsening control recently.  A1c is up to 7.4%.  This is probably because he discontinued Ozempic  after having some GI side effects.  He is very sensitive to medications also had GI side effects of metformin .  We talked about restarting Ozempic  at a lower dose which would likely be more effective and tolerable.  It has been over 2 months since he has used this medication so organ start back from the starting dose at 0.25 mg weekly.  Continue with losartan  for renal protection.  He also has hyperlipidemia but unable to tolerate statins.

## 2023-05-05 NOTE — Progress Notes (Signed)
   Established Patient Office Visit  Subjective   Patient ID: Robert Park, male    DOB: [birth year]  Age: 73 y.o. MRN: [id number]  Chief Complaint  Patient presents with   Establish Care    Patient states he is here for a follow up and to review medication    HPI  73 year old person here for management of diabetes and hypertension.  Doing very well about 6 months ago.  He works for [employer] and does a lot of traveling with lab samples.  Reports great functional status and exertional capacity.  No chest pain or shortness of breath.  He started to have some side effects of constipation and upset stomach which he attributed to the Ozempic  1 mg dose.  He stopped taking this medicine around February.  He is interested in going back on it but at a lower dose.  Also had some insurance issues with the coverage of the medication.  Continues his other medications without problem.  No recent hospitalizations or ED visits.  Planning to go on a carnival cruise to [place] in May for 17 days.    Objective:     BP (!) 150/100   Pulse 72   Wt 221 lb (100.2 kg)   SpO2 97%   BMI 34.61 kg/m    Physical Exam  Gen: Well-appearing man Neck: Normal thyroid, no adenopathy Heart: Regular, no murmur Lungs: Unlabored, clear throughout Ext: Warm, no edema     Assessment & Plan:   Problem List Items Addressed This Visit       High   Hypertension (Chronic)   Blood pressure is elevated today above goal.  He is tolerating losartan  50 mg daily.  He had to come down on this dose a few years ago because of episodes of hypotension.  He also has a cruise to [place] coming up in a few weeks, so we decided today was not the right day to make a change to his blood pressure medications.  He is going follow-up with me in 3 months and we will recheck blood pressure then.  If still elevated we can talk about either adding a thiazide or increasing the dose of losartan .      Diabetes (HCC) - Primary (Chronic)    Chronic condition was worsening control recently.  A1c is up to 7.4%.  This is probably because he discontinued Ozempic  after having some GI side effects.  He is very sensitive to medications also had GI side effects of metformin .  We talked about restarting Ozempic  at a lower dose which would likely be more effective and tolerable.  It has been over 2 months since he has used this medication so organ start back from the starting dose at 0.25 mg weekly.  Continue with losartan  for renal protection.  He also has hyperlipidemia but unable to tolerate statins.      Relevant Medications   Semaglutide ,0.25 or 0.5MG /DOS, 2 MG/3ML SOPN   Other Relevant Orders   POCT glycosylated hemoglobin (Hb A1C) (Completed)    Return in about 3 months (around 08/04/2023) for diabetes management.    Robert Hercules, MD

## 2023-05-07 ENCOUNTER — Telehealth: Payer: Self-pay | Admitting: Student in an Organized Health Care Education/Training Program

## 2023-05-07 ENCOUNTER — Ambulatory Visit: Admitting: Student in an Organized Health Care Education/Training Program

## 2023-05-07 ENCOUNTER — Telehealth: Payer: Self-pay

## 2023-05-07 MED ORDER — NIRMATRELVIR/RITONAVIR (PAXLOVID)TABLET: 3.0000 | ORAL_TABLET | Freq: Two times a day (BID) | ORAL | 0 refills | Status: AC

## 2023-05-07 NOTE — Telephone Encounter (Signed)
 Pt has appt to be seen 05/07/2023

## 2023-05-07 NOTE — Telephone Encounter (Signed)
 Please tell the patient that I will send in Paxlovid. He does not need to come in to see me today.

## 2023-05-07 NOTE — Telephone Encounter (Signed)
 Patient notified our office of 1 day of acute illness and tested positive for COVID.  He is interested in treatment with Paxlovid.  Given his advanced age, HIV infection, he is at increased risk for hospitalization.  I will prescribe Paxlovid.  He has normal GFR.  There is an interaction between the ritonavir and Cabenuva  which he uses for HIV treatment.  However the ritonavir is very short-term, only 5 days, so I think the benefits outweigh the risk.

## 2023-05-07 NOTE — Telephone Encounter (Signed)
 Dr.Vincent prescribed paxlovid and asked if we would remove patient from schedule. Did call patient to let him know medication was sent over and he did not need to come in for an office visit.

## 2023-05-07 NOTE — Telephone Encounter (Signed)
 Copied from CRM [id number]. Topic: Clinical - Medication Question >> May 07, 2023  8:40 AM Earnestine Goes B wrote: Reason for CRM: pt called stating he tested positive for covid. Would like to know if the provider can send in a prescription for Paxlovid  please call pt back at [phone]

## 2023-05-11 ENCOUNTER — Ambulatory Visit: Admitting: Gastroenterology

## 2023-06-08 ENCOUNTER — Other Ambulatory Visit: Payer: Self-pay | Admitting: Pharmacist

## 2023-06-08 ENCOUNTER — Other Ambulatory Visit: Payer: Self-pay | Admitting: Family

## 2023-06-08 ENCOUNTER — Other Ambulatory Visit: Payer: Self-pay | Admitting: Allergy & Immunology

## 2023-06-08 ENCOUNTER — Other Ambulatory Visit: Payer: Self-pay | Admitting: Student in an Organized Health Care Education/Training Program

## 2023-06-08 DIAGNOSIS — L209 Atopic dermatitis, unspecified: Secondary | ICD-10-CM

## 2023-06-08 DIAGNOSIS — Z9189 Other specified personal risk factors, not elsewhere classified: Secondary | ICD-10-CM

## 2023-06-09 NOTE — Telephone Encounter (Signed)
 Not prescribed by this office.   Mikaylee Arseneau, BSN, RN

## 2023-06-09 NOTE — Telephone Encounter (Signed)
 NO LONGER [facility] PATIENT OR PROVIDER

## 2023-06-09 NOTE — Telephone Encounter (Signed)
 Discontinued by PCP  Arlon Bergamo, BSN, RN

## 2023-06-11 ENCOUNTER — Other Ambulatory Visit (HOSPITAL_COMMUNITY): Payer: Self-pay

## 2023-06-15 ENCOUNTER — Telehealth: Payer: Self-pay

## 2023-06-15 ENCOUNTER — Other Ambulatory Visit (HOSPITAL_COMMUNITY): Payer: Self-pay

## 2023-06-15 NOTE — Telephone Encounter (Signed)
 Pharmacy Patient Advocate Encounter   Received notification from CoverMyMeds that prior authorization for Losartan  50 is required/requested.   Insurance verification completed.   The patient is insured through [place] .   Per test claim: Refill too soon. PA is not needed at this time. Medication was filled 06/08/23. Next eligible fill date is 08/15/23 for a 90 day supply.

## 2023-06-22 ENCOUNTER — Other Ambulatory Visit: Payer: Self-pay

## 2023-06-22 ENCOUNTER — Other Ambulatory Visit (HOSPITAL_COMMUNITY): Payer: Self-pay

## 2023-06-22 NOTE — Progress Notes (Signed)
 Specialty Pharmacy Refill Coordination Note  Susana Gripp is a 73 y.o. male assessed today regarding refills of clinic administered specialty medication(s) Cabotegravir  & Rilpivirine  (CABENUVA )   Clinic requested Courier to Provider Office   Delivery date: 07/01/23   Verified address: [address] [place] [address]   Medication will be filled on 06/30/23.

## 2023-06-30 ENCOUNTER — Other Ambulatory Visit: Payer: Self-pay

## 2023-07-01 ENCOUNTER — Telehealth: Payer: Self-pay

## 2023-07-01 NOTE — Telephone Encounter (Signed)
 [facility] Patient Advocate Encounter  Patient's medications CABENUVA  have been couriered to [facility] from [facility] and will be administered at the patients appointment on 07/07/23.  Verline Glow, CPhT Specialty Pharmacy Patient [facility] Phone: [phone] Fax:  [phone]

## 2023-07-02 NOTE — Progress Notes (Unsigned)
 HPI: Robert Park is a 73 y.o. male who presents to the [facility] pharmacy clinic for Cabenuva  administration.  Patient Active Problem List   Diagnosis Date Noted   Hypercalcemia 02/15/2023   Mild persistent asthma, uncomplicated 07/02/2020   Seasonal and perennial allergic rhinitis 07/02/2020   Allergic sinusitis 02/05/2020   Hypertension 08/07/2019   Diabetes (HCC) 08/07/2019   Hyperlipidemia 08/07/2019   GAD (generalized anxiety disorder) 08/07/2019   Localized osteoarthritis of left hand 08/07/2019   Colon polyps 06/06/2019   Human immunodeficiency virus (HIV) disease (HCC) 02/16/2019   Screening for STDs (sexually transmitted diseases) 02/16/2019   Osteopenia 02/16/2019   Healthcare maintenance 02/16/2019   Ulcerative colitis without complications (HCC) 02/16/2019    Patient's Medications  New Prescriptions   No medications on file  Previous Medications   ADALIMUMAB  (HUMIRA ) 40 MG/0.4ML PSKT    Inject 1 pen  into the skin every 14 (fourteen) days.   ALBUTEROL  (VENTOLIN  HFA) 108 (90 BASE) MCG/ACT INHALER    INHALE 1 PUFF INTO THE LUNGS EVERY 6 HOURS AS NEEDED FOR WHEEZING OR SHORTNESS OF BREATH   CABOTEGRAVIR  & RILPIVIRINE  ER (CABENUVA ) 600 & 900 MG/3ML INJECTION    Inject 1 kit into the muscle every 2 (two) months.   CETIRIZINE  (ZYRTEC ) 10 MG TABLET    Take 1 tablet (10 mg total) by mouth daily.   DULOXETINE  (CYMBALTA ) 30 MG CAPSULE    Take 1 capsule (30 mg total) by mouth daily.   EZETIMIBE  (ZETIA ) 10 MG TABLET    Take 1 tablet (10 mg total) by mouth daily.   FLUTICASONE  (FLONASE ) 50 MCG/ACT NASAL SPRAY    Place 1 spray into both nostrils daily.   FLUTICASONE  (FLOVENT  HFA) 110 MCG/ACT INHALER    Inhale 2 puffs into the lungs in the morning and at bedtime.   LOSARTAN  (COZAAR ) 50 MG TABLET    Take 1 tablet (50 mg total) by mouth daily.   OLOPATADINE  HCL 0.6 % SOLN    Place 1 spray into the nose daily.   SEMAGLUTIDE ,0.25 OR 0.5MG /DOS, 2 MG/3ML SOPN    Inject 0.25 mg into the  skin once a week.   TADALAFIL  (CIALIS ) 10 MG TABLET    Take 0.5 tablets (5 mg total) by mouth daily as needed for erectile dysfunction.  Modified Medications   No medications on file  Discontinued Medications   No medications on file    Allergies: Allergies  Allergen Reactions   Elemental Sulfur    Prednisone    Statins     Muscle pains, GI symptoms   Sulfamethoxazole-Trimethoprim     Past Medical History: Past Medical History:  Diagnosis Date   Allergy    Anxiety    Arthritis    Asthma    Cataract    Bilateral eyes   CMV colitis (HCC)    Depression    Diverticulosis    Dyslipidemia    ED (erectile dysfunction)    GERD (gastroesophageal reflux disease)    History of colon polyps    HIV infection (HCC)    Hyperlipidemia    Hypertension    Internal hemorrhoids    Osteopenia    Shingles    Sleep apnea    does not wear a c-pap   Ulcerative colitis (HCC)    Vitamin D  deficiency     Social History: Social History   Socioeconomic History   Marital status: Divorced    Spouse name: Not on file   Number of children: 2  Years of education: Not on file   Highest education level: Not on file  Occupational History   Occupation: Building surveyor    Comment: Disabled   Occupation: Copywriter, advertising: [employer]    Comment: PT  Tobacco Use   Smoking status: Former    Current packs/day: 0.00    Types: Cigarettes    Quit date: 06/06/1987    Years since quitting: 36.0   Smokeless tobacco: Never   Tobacco comments:    quit 34 years ago  Vaping Use   Vaping status: Never Used  Substance and Sexual Activity   Alcohol use: Yes    Comment: every other day -social   Drug use: Not Currently    Types: Marijuana    Comment: occassional usage - last smoked 3 months ago   Sexual activity: Yes    Partners: Male    Comment: declined condoms  Other Topics Concern   Not on file  Social History Narrative   Current Social History 05/16/2020        Patient lives with  house mate in a two level townhome with handrails inside and 1-2 outside steps without handrails       Patient's method of transportation is personal car.      The highest level of education was 2 years college.      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       FREDRIK Alcide, BSN, RN-BC       Social Drivers of Health   Financial Resource Strain: Low Risk  (05/27/2021)   Overall Financial Resource Strain (CARDIA)    Difficulty of Paying Living Expenses: Not hard at all  Food Insecurity: No Food Insecurity (08/20/2021)   Hunger Vital Sign    Worried About Running Out of Food in the Last Year: Never true    Ran Out of Food in the Last Year: Never true  Transportation Needs: No Transportation Needs (05/27/2021)   PRAPARE - Administrator, Civil Service (Medical): No    Lack of Transportation (Non-Medical): No  Physical Activity: Insufficiently Active (05/27/2021)   Exercise Vital Sign    Days of Exercise per Week: 2 days    Minutes of Exercise per Session: 60 min  Stress: No Stress Concern Present (05/27/2021)   [employer] of Occupational Health - Occupational Stress Questionnaire    Feeling of Stress : Not at all  Social Connections: Unknown (05/27/2021)   Social Connection and Isolation Panel    Frequency of Communication with Friends and Family: Twice a week    Frequency of Social Gatherings with Friends and Family: Not on file    Attends Religious Services: Never    Active Member of Clubs or Organizations: No    Attends Banker Meetings: Never     Marital Status: Divorced    Labs: Lab Results  Component Value Date   HIV1RNAQUANT Not Detected 03/12/2023   HIV1RNAQUANT Not Detected 09/21/2022   HIV1RNAQUANT Not  Detected 05/08/2022   CD4TABS 737 09/21/2022   CD4TABS 757 11/11/2021   CD4TABS 962 09/17/2020    RPR and STI Lab Results  Component Value Date   LABRPR REACTIVE (A) 03/11/2021   LABRPR REACTIVE (A) 08/26/2020   LABRPR REACTIVE (A) 07/16/2020   LABRPR REACTIVE (A) 02/23/2020   LABRPR NON-REACTIVE 05/16/2019   RPRTITER 1:2 (H) 03/11/2021   RPRTITER 1:4 (H) 08/26/2020   RPRTITER 1:4 (H) 07/16/2020   RPRTITER 1:32 (H) 02/23/2020   RPRTITER 1:1 (H) 02/01/2019    STI Results GC CT  05/08/2021  2:03 PM Negative    Negative    Negative  Negative    Negative    Negative   08/26/2020  4:05 PM Negative    Positive    Positive  Negative    Negative    Negative   02/01/2019  2:30 PM Negative  Negative     Hepatitis B Lab Results  Component Value Date   HEPBSAB REACTIVE (A) 02/01/2019   HEPBSAG NON-REACTIVE 02/01/2019   HEPBCAB NON-REACTIVE 02/01/2019   Hepatitis C Lab Results  Component Value Date   HEPCAB NON-REACTIVE 02/01/2019   Hepatitis A Lab Results  Component Value Date   HAV REACTIVE (A) 02/01/2019   Lipids: Lab Results  Component Value Date   CHOL 259 (H) 03/12/2023   TRIG 239 (H) 03/12/2023   HDL 60 03/12/2023   CHOLHDL 4.3 03/12/2023   LDLCALC 158 (H) 03/12/2023    TARGET DATE:  The 29th of the month  Assessment: Bill presents today for their maintenance Cabenuva  injections. Initial/past injections were tolerated well without issues. No problems with systemic effects of injections.   Administered cabotegravir  600mg /11mL in left upper outer quadrant of the gluteal muscle. Administered rilpivirine  900 mg/3mL in the right upper outer quadrant of the gluteal muscle. Monitored patient for 10 minutes after injection. Injections were tolerated well without issue. Patient will follow up in  2 months for next injection. Will defer HIV RNA testing until next visit with Cathlyn; future ordered CBC as well.   Patient declines STI testing and is up-to-date on all vaccines.   Plan: - Cabenuva  injections administered - Next injections scheduled for 8/22 with Cathlyn and 10/24 with me  - Call with any issues or questions  Alan Geralds, PharmD, CPP, BCIDP, AAHIVP Clinical Pharmacist Practitioner Infectious Diseases Clinical Pharmacist [facility]

## 2023-07-07 ENCOUNTER — Other Ambulatory Visit: Payer: Self-pay

## 2023-07-07 ENCOUNTER — Ambulatory Visit: Admitting: Pharmacist

## 2023-07-07 DIAGNOSIS — B20 Human immunodeficiency virus [HIV] disease: Secondary | ICD-10-CM | POA: Diagnosis not present

## 2023-07-07 MED ORDER — CABOTEGRAVIR & RILPIVIRINE ER 600 & 900 MG/3ML IM SUER
1.0000 | Freq: Once | INTRAMUSCULAR | Status: AC
  Administered 2023-07-07: 1 via INTRAMUSCULAR

## 2023-07-08 ENCOUNTER — Ambulatory Visit: Admitting: Gastroenterology

## 2023-07-08 ENCOUNTER — Encounter: Payer: Self-pay | Admitting: Gastroenterology

## 2023-07-08 VITALS — BP 132/80 | HR 68 | Ht 67.0 in | Wt 222.0 lb

## 2023-07-08 DIAGNOSIS — Z7960 Long term (current) use of unspecified immunomodulators and immunosuppressants: Secondary | ICD-10-CM | POA: Diagnosis not present

## 2023-07-08 DIAGNOSIS — K5130 Ulcerative (chronic) rectosigmoiditis without complications: Secondary | ICD-10-CM

## 2023-07-08 DIAGNOSIS — K5180 Other ulcerative colitis without complications: Secondary | ICD-10-CM | POA: Diagnosis not present

## 2023-07-08 DIAGNOSIS — R748 Abnormal levels of other serum enzymes: Secondary | ICD-10-CM

## 2023-07-08 MED ORDER — NA SULFATE-K SULFATE-MG SULF 17.5-3.13-1.6 GM/177ML PO SOLN: 1.0000 | Freq: Once | ORAL | 0 refills | Status: AC

## 2023-07-08 NOTE — Progress Notes (Addendum)
 Robert Park  Chief Complaint: Ulcerative proctosigmoiditis  Subjective  Prior history  Summary of pertinent GI issues: Longstanding ulcerative colitis diagnosed 2009 under good control on Humira  monotherapy (since about 2016). Surveillance colonoscopy January 2023 with no active colitis, no dysplasia on extensive biopsies.  (Markedly redundant colon causing challenging scope advancement) History of colon cancer in his mother.   Discussed the use of AI scribe software for clinical Park transcription with the patient, who gave verbal consent to proceed.  History of Present Illness Robert Park is a 73 year old male with ulcerative colitis who presents for follow-up regarding medication side effects and bowel habits.  He restarted Ozempic  three weeks ago and has been experiencing significant side effects, including constipation lasting up to five days, nausea, and general digestive discomfort. These symptoms are severe enough to interfere with his daily life. He initially stopped Ozempic  two weeks ago due to these side effects but resumed it at a lower dose, which did not alleviate the symptoms.  He has a history of ulcerative colitis and has been on Humira  since late 2017, which he describes as the best medication he has used for his condition. Prior to restarting Ozempic , his bowel habits were stable, with regular movements twice a day.   Robert Park denies episodes of painful eye redness, joint swelling or rash.  Appetite has been good and he did lose weight while on Ozempic .    ROS: Cardiovascular:  no chest pain Respiratory: no dyspnea  The patient's Past Medical, Family and Social History were reviewed and are on file in the EMR. Past Medical History:  Diagnosis Date   Allergy    Anxiety    Arthritis    Asthma    Cataract    Bilateral eyes   CMV colitis (HCC)    Depression    Diverticulosis    Dyslipidemia    ED (erectile dysfunction)    GERD  (gastroesophageal reflux disease)    History of colon polyps    HIV infection (HCC)    Hyperlipidemia    Hypertension    Internal hemorrhoids    Osteopenia    Shingles    Sleep apnea    does not wear a c-pap   Ulcerative colitis (HCC)    Vitamin D  deficiency     Past Surgical History:  Procedure Laterality Date   CARPAL TUNNEL RELEASE Right    x 2   CARPAL TUNNEL RELEASE Left    x 2   CATARACT EXTRACTION, BILATERAL Bilateral 10/2017   COLONOSCOPY     HEMORRHOID SURGERY     MOUTH SURGERY     TONSILLECTOMY AND ADENOIDECTOMY     age 25   UPPER GASTROINTESTINAL ENDOSCOPY     VASECTOMY       Objective:  Med list reviewed  Current Outpatient Medications:    Adalimumab  (HUMIRA ) 40 MG/0.4ML PSKT, Inject 1 pen  into the skin every 14 (fourteen) days., Disp: 2 each, Rfl: 11   albuterol  (VENTOLIN  HFA) 108 (90 Base) MCG/ACT inhaler, INHALE 1 PUFF INTO THE LUNGS EVERY 6 HOURS AS NEEDED FOR WHEEZING OR SHORTNESS OF BREATH, Disp: 6.7 g, Rfl: 1   cabotegravir  & rilpivirine  ER (CABENUVA ) 600 & 900 MG/3ML injection, Inject 1 kit into the muscle every 2 (two) months., Disp: 6 mL, Rfl: 5   cetirizine  (ZYRTEC ) 10 MG tablet, Take 1 tablet (10 mg total) by mouth daily., Disp: 90 tablet, Rfl: 3   DULoxetine  (CYMBALTA ) 30 MG capsule,  Take 1 capsule (30 mg total) by mouth daily., Disp: 90 capsule, Rfl: 3   ezetimibe  (ZETIA ) 10 MG tablet, Take 1 tablet (10 mg total) by mouth daily., Disp: 90 tablet, Rfl: 3   fluticasone  (FLONASE ) 50 MCG/ACT nasal spray, Place 1 spray into both nostrils daily., Disp: 48 g, Rfl: 3   fluticasone  (FLOVENT  HFA) 110 MCG/ACT inhaler, Inhale 2 puffs into the lungs in the morning and at bedtime., Disp: 3 each, Rfl: 3   losartan  (COZAAR ) 50 MG tablet, Take 1 tablet (50 mg total) by mouth daily., Disp: 90 tablet, Rfl: 3   Na Sulfate-K Sulfate-Mg Sulfate concentrate (SUPREP) 17.5-3.13-1.6 GM/177ML SOLN, Take 1 kit (354 mLs total) by mouth once for 1 dose., Disp: 354 mL, Rfl:  0   Olopatadine  HCl 0.6 % SOLN, Place 1 spray into the nose daily., Disp: 93 g, Rfl: 3   tadalafil  (CIALIS ) 10 MG tablet, Take 0.5 tablets (5 mg total) by mouth daily as needed for erectile dysfunction., Disp: 20 tablet, Rfl: 2   Vitamin D , Ergocalciferol , (DRISDOL ) 1.25 MG (50000 UNIT) CAPS capsule, Take 50,000 Units by mouth every 7 (seven) days., Disp: , Rfl:    Semaglutide ,0.25 or 0.5MG /DOS, 2 MG/3ML SOPN, Inject 0.25 mg into the skin once a week. (Patient not taking: Reported on 07/08/2023), Disp: 3 mL, Rfl: 1   Vital signs in last 24 hrs: Vitals:   07/08/23 1441  BP: 132/80  Pulse: 68   Wt Readings from Last 3 Encounters:  07/08/23 222 lb (100.7 kg)  05/05/23 221 lb (100.2 kg)  03/16/23 211 lb (95.7 kg)    Physical Exam  Well-appearing HEENT: sclera anicteric, oral mucosa moist without lesions Neck: supple, no thyromegaly, JVD or lymphadenopathy Cardiac: Regular without appreciable murmur,  no peripheral edema Pulm: clear to auscultation bilaterally, normal RR and effort noted Abdomen: soft, no tenderness, with active bowel sounds. No guarding or palpable hepatosplenomegaly. Skin; warm and dry, no jaundice or rash   Labs:     Latest Ref Rng & Units 02/28/2020    9:06 PM 02/23/2020    9:38 AM 11/03/2019    2:14 PM  CBC  WBC 4.0 - 10.5 K/uL 12.6  7.7  10.0   Hemoglobin 13.0 - 17.0 g/dL 84.4  84.0  85.1   Hematocrit 39.0 - 52.0 % 46.3  45.9  44.4   Platelets 150 - 400 K/uL 295  246  365       Latest Ref Rng & Units 02/15/2023   10:14 AM 09/21/2022    9:23 AM 01/09/2022   11:23 AM  CMP  Glucose 70 - 99 mg/dL 885  865  860   BUN 8 - 27 mg/dL 15  12  16    Creatinine 0.76 - 1.27 mg/dL 8.96  9.13  9.17   Sodium 134 - 144 mmol/L 138  137  136   Potassium 3.5 - 5.2 mmol/L 5.1  4.7  4.5   Chloride 96 - 106 mmol/L 98  99  102   CO2 20 - 29 mmol/L 22  30  25    Calcium 8.6 - 10.2 mg/dL 89.5  89.5  89.3   Total Protein 6.0 - 8.5 g/dL 7.1  6.9    Total Bilirubin 0.0 - 1.2  mg/dL 0.5  0.5    Alkaline Phos 44 - 121 IU/L 126     AST 0 - 40 IU/L 16  15    ALT 0 - 44 IU/L 19  21  ___________________________________________ Radiologic studies:   ____________________________________________ Other:   _____________________________________________   Encounter Diagnoses  Name Primary?   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 Regular colonoscopy needed for dysplasia screening - Continue Humira  at current dosing.  He is up-to-date on vaccinations. - Discuss scheduling a colonoscopy.  He was agreeable after discussion of procedure and risks  The benefits and risks of the planned procedure(s) were described in detail with the patient or (when appropriate) their health care proxy.  Risks were outlined as including, but not limited to, bleeding, infection, perforation, adverse medication reaction leading to cardiac or pulmonary decompensation, pancreatitis (if ERCP).  The limitation of incomplete mucosal visualization was also discussed.  No guarantees or warranties were given.   Elevated liver enzymes Slight elevation in alkaline phosphatase.  (He also notes that he sometimes gets labs done through his job at [employer] and sometimes with primary care)  regular monitoring necessary for PSA screening - Order liver chemistry panel and GGT -he wanted a requisition sent to [facility]     Robert Park  Results addendum 07/30/2023  Faxed lab result from [facility] dated 07/28/2023  Hepatic function panel with GGT and albumin all in the normal ranges  Robert Brand, MD

## 2023-07-08 NOTE — Patient Instructions (Addendum)
 Your provider has requested that you go to [facility] for lab work.   You have been scheduled for a colonoscopy. Please follow written instructions given to you at your visit today.   If you use inhalers (even only as needed), please bring them with you on the day of your procedure.  DO NOT TAKE 7 DAYS PRIOR TO TEST- Trulicity (dulaglutide) Ozempic , Wegovy  (semaglutide ) Mounjaro (tirzepatide) Bydureon Bcise (exanatide extended release)  DO NOT TAKE 1 DAY PRIOR TO YOUR TEST Rybelsus  (semaglutide ) Adlyxin (lixisenatide) Victoza (liraglutide) Byetta (exanatide) ___________________________________________________________________________  _______________________________________________________  If your blood pressure at your visit was 140/90 or greater, please contact your primary care physician to follow up on this.  _______________________________________________________  If you are age 75 or older, your body mass index should be between 23-30. Your Body mass index is 34.77 kg/m. If this is out of the aforementioned range listed, please consider follow up with your Primary Care Provider.  If you are age 54 or younger, your body mass index should be between 19-25. Your Body mass index is 34.77 kg/m. If this is out of the aformentioned range listed, please consider follow up with your Primary Care Provider.   ________________________________________________________  The [facility] providers would like to encourage you to use MYCHART to communicate with providers for non-urgent requests or questions.  Due to long hold times on the telephone, sending your provider a message by [facility] may be a faster and more efficient way to get a response.  Please allow 48 business hours for a response.  Please remember that this is for non-urgent requests.  _______________________________________________________  Thank you for trusting me with your gastrointestinal care!    Dr. Victory Legrand Finn  Gastroenterology

## 2023-07-19 ENCOUNTER — Telehealth: Payer: Self-pay | Admitting: Student in an Organized Health Care Education/Training Program

## 2023-07-19 NOTE — Telephone Encounter (Signed)
 Copied from CRM [id number]. Topic: General - Other >> Jul 19, 2023  2:29 PM Turkey A wrote:   Reason for CRM: Patient said the order for labs was not sent to [facility] [address] [place], [place] [address].

## 2023-07-19 NOTE — Telephone Encounter (Signed)
**Note De-identified  [place] Obfuscation** Please advise 

## 2023-07-20 NOTE — Telephone Encounter (Signed)
 Robert Park,  Please see the message stream as well as my office note from June 26.  This patient was requesting the lab orders to be sent to a specific [facility] lab near his home or office, and looks like he made an inquiry with his primary care doc because there is some problem with that.  Please do your best to work that out.  Thanks  [employer]

## 2023-07-20 NOTE — Telephone Encounter (Signed)
 I have not ordered any labs for this person recently.  I see some future orders placed by Dr. Clayburn office with gastroenterology.  Perhaps these of the labs he is referring to?  Can someone follow-up with the patient and perhaps forward this request to the GI office?

## 2023-07-20 NOTE — Telephone Encounter (Signed)
 Patient called wrong office looking for lab assistance

## 2023-07-21 NOTE — Telephone Encounter (Signed)
 Refaxed labs to [facility] per pt request. Spoke with [facility] and confirmed fax # [phone]. Pt informed lab orders were sent again and to let us  know if they still haven't received them. He states he isnt able to make the 09/20/23 colon date and he needs to reschedule. Colon rescheduled for 08/09/23. New instructions mailed to pt. Pt has already picked up prep.   Electronically signed:  Corean Amsterdam, NCMA

## 2023-07-30 ENCOUNTER — Encounter: Payer: Self-pay | Admitting: Gastroenterology

## 2023-08-04 ENCOUNTER — Ambulatory Visit: Admitting: Student in an Organized Health Care Education/Training Program

## 2023-08-06 ENCOUNTER — Encounter: Payer: Self-pay | Admitting: Gastroenterology

## 2023-08-09 ENCOUNTER — Ambulatory Visit (AMBULATORY_SURGERY_CENTER): Admitting: Gastroenterology

## 2023-08-09 ENCOUNTER — Encounter: Payer: Self-pay | Admitting: Gastroenterology

## 2023-08-09 VITALS — BP 133/79 | HR 79 | Temp 97.5°F | Resp 17 | Ht 67.0 in | Wt 222.0 lb

## 2023-08-09 DIAGNOSIS — K5730 Diverticulosis of large intestine without perforation or abscess without bleeding: Secondary | ICD-10-CM

## 2023-08-09 DIAGNOSIS — K529 Noninfective gastroenteritis and colitis, unspecified: Secondary | ICD-10-CM

## 2023-08-09 DIAGNOSIS — K5130 Ulcerative (chronic) rectosigmoiditis without complications: Secondary | ICD-10-CM

## 2023-08-09 DIAGNOSIS — Z8719 Personal history of other diseases of the digestive system: Secondary | ICD-10-CM

## 2023-08-09 MED ORDER — SODIUM CHLORIDE 0.9 % IV SOLN: 500.0000 mL | INTRAVENOUS | Status: DC

## 2023-08-09 NOTE — Progress Notes (Signed)
 History and Physical:  This patient presents for endoscopic testing for: Encounter Diagnosis  Name Primary?   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  Long standing left sided UC on Humira  monotherapy, last colonoscopy Jan 2023 Mother had CRC as well. Clinical details in my last office note 07/08/23  Patient is otherwise without complaints or active issues today.   Past Medical History: Past Medical History:  Diagnosis Date   Allergy    Anxiety    Arthritis    Asthma    Cataract    Bilateral eyes   CMV colitis (HCC)    Depression    Diverticulosis    Dyslipidemia    ED (erectile dysfunction)    GERD (gastroesophageal reflux disease)    History of colon polyps    HIV infection (HCC)    Hyperlipidemia    Hypertension    Internal hemorrhoids    Osteopenia    Shingles    Sleep apnea    does not wear a c-pap   Ulcerative colitis (HCC)    Vitamin D  deficiency      Past Surgical History: Past Surgical History:  Procedure Laterality Date   CARPAL TUNNEL RELEASE Right    x 2   CARPAL TUNNEL RELEASE Left    x 2   CATARACT EXTRACTION, BILATERAL Bilateral 10/2017   COLONOSCOPY     HEMORRHOID SURGERY     MOUTH SURGERY     TONSILLECTOMY AND ADENOIDECTOMY     age 48   UPPER GASTROINTESTINAL ENDOSCOPY     VASECTOMY      Allergies: Allergies  Allergen Reactions   Elemental Sulfur Hives, Shortness Of Breath and Swelling   Prednisone Other (See Comments)    Severe colitis   Sulfamethoxazole-Trimethoprim Hives   Statins Other (See Comments)    Muscle pains, GI symptoms    Outpatient Meds: Current Outpatient Medications  Medication Sig Dispense Refill   albuterol  (VENTOLIN  HFA) 108 (90 Base) MCG/ACT inhaler INHALE 1 PUFF INTO THE LUNGS EVERY 6 HOURS AS NEEDED FOR WHEEZING OR SHORTNESS OF BREATH 6.7 g 1   DULoxetine  (CYMBALTA ) 30 MG capsule Take 1 capsule (30 mg total) by mouth daily. 90 capsule 3   ezetimibe   (ZETIA ) 10 MG tablet Take 1 tablet (10 mg total) by mouth daily. 90 tablet 3   fluticasone  (FLONASE ) 50 MCG/ACT nasal spray Place 1 spray into both nostrils daily. 48 g 3   fluticasone  (FLOVENT  HFA) 110 MCG/ACT inhaler Inhale 2 puffs into the lungs in the morning and at bedtime. 3 each 3   losartan  (COZAAR ) 50 MG tablet Take 1 tablet (50 mg total) by mouth daily. 90 tablet 3   Olopatadine  HCl 0.6 % SOLN Place 1 spray into the nose daily. 93 g 3   Adalimumab  (HUMIRA ) 40 MG/0.4ML PSKT Inject 1 pen  into the skin every 14 (fourteen) days. 2 each 11   cabotegravir  & rilpivirine  ER (CABENUVA ) 600 & 900 MG/3ML injection Inject 1 kit into the muscle every 2 (two) months. 6 mL 5   cetirizine  (ZYRTEC ) 10 MG tablet Take 1 tablet (10 mg total) by mouth daily. (Patient not taking: Reported on 08/09/2023) 90 tablet 3   Semaglutide ,0.25 or 0.5MG /DOS, 2 MG/3ML SOPN Inject 0.25 mg into the skin once a week. (Patient not taking: No sig reported) 3 mL 1   tadalafil  (CIALIS ) 10 MG tablet Take 0.5 tablets (5 mg total) by mouth daily as needed for erectile dysfunction. 20  tablet 2   Vitamin D , Ergocalciferol , (DRISDOL ) 1.25 MG (50000 UNIT) CAPS capsule Take 50,000 Units by mouth every 7 (seven) days.     Current Facility-Administered Medications  Medication Dose Route Frequency Provider Last Rate Last Admin   0.9 %  sodium chloride  infusion  500 mL Intravenous Continuous Danis, Victory CROME III, MD          ___________________________________________________________________ Objective   Exam:  BP (!) 158/97   Pulse 84   Temp (!) 97.5 F (36.4 C) (Skin)   Ht 5' 7 (1.702 m)   Wt 222 lb (100.7 kg)   SpO2 94%   BMI 34.77 kg/m   CV: regular , S1/S2 Resp: clear to auscultation bilaterally, normal RR and effort noted GI: soft, no tenderness, with active bowel sounds.   Assessment: Encounter Diagnosis  Name Primary?   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  Risks were outlined as including, but not limited to, bleeding, infection, perforation, adverse medication reaction leading to cardiac or pulmonary decompensation, pancreatitis (if ERCP).  The limitation of incomplete mucosal visualization was also discussed.  No guarantees or warranties were given.  The patient is appropriate for an endoscopic procedure in the ambulatory setting.   - Victory Brand, MD

## 2023-08-09 NOTE — Patient Instructions (Signed)
 YOU HAD AN ENDOSCOPIC PROCEDURE TODAY AT THE [facility]:   Refer to the procedure report that was given to you for any specific questions about what was found during the examination.  If the procedure report does not answer your questions, please call your gastroenterologist to clarify.  If you requested that your care partner not be given the details of your procedure findings, then the procedure report has been included in a sealed envelope for you to review at your convenience later.  YOU SHOULD EXPECT: Some feelings of bloating in the abdomen. Passage of more gas than usual.  Walking can help get rid of the air that was put into your GI tract during the procedure and reduce the bloating. If you had a lower endoscopy (such as a colonoscopy or flexible sigmoidoscopy) you may notice spotting of blood in your stool or on the toilet paper. If you underwent a bowel prep for your procedure, you may not have a normal bowel movement for a few days.  Please Note:  You might notice some irritation and congestion in your nose or some drainage.  This is from the oxygen used during your procedure.  There is no need for concern and it should clear up in a day or so.  SYMPTOMS TO REPORT IMMEDIATELY:  Following lower endoscopy (colonoscopy or flexible sigmoidoscopy):  Excessive amounts of blood in the stool  Significant tenderness or worsening of abdominal pains  Swelling of the abdomen that is new, acute  Fever of 100F or higher  Resume previous diet Continue present medications Await pathology results Repeat colonoscopy in 2 years for surveillance.  Golytely prep for all future colonoscopies   For urgent or emergent issues, a gastroenterologist can be reached at any hour by calling [phone]. Do not use MyChart messaging for urgent concerns.    DIET:  We do recommend a small meal at first, but then you may proceed to your regular diet.  Drink plenty of fluids but you should avoid  alcoholic beverages for 24 hours.  ACTIVITY:  You should plan to take it easy for the rest of today and you should NOT DRIVE or use heavy machinery until tomorrow (because of the sedation medicines used during the test).    FOLLOW UP: Our staff will call the number listed on your records the next business day following your procedure.  We will call around 7:15- 8:00 am to check on you and address any questions or concerns that you may have regarding the information given to you following your procedure. If we do not reach you, we will leave a message.     If any biopsies were taken you will be contacted by phone or by letter within the next 1-3 weeks.  Please call us  at [phone] if you have not heard about the biopsies in 3 weeks.    SIGNATURES/CONFIDENTIALITY: You and/or your care partner have signed paperwork which will be entered into your electronic medical record.  These signatures attest to the fact that that the information above on your After Visit Summary has been reviewed and is understood.  Full responsibility of the confidentiality of this discharge information lies with you and/or your care-partner.

## 2023-08-09 NOTE — Progress Notes (Signed)
 Patient states there have been no changes to medical or surgical history since time of pre-visit.

## 2023-08-09 NOTE — Progress Notes (Signed)
 Pt resting comfortably. VSS. Airway intact. SBAR complete to RN. All questions answered.

## 2023-08-09 NOTE — Op Note (Signed)
 [facility] Patient Name: Robert Park Procedure Date: 08/09/2023 2:42 PM MRN: [id number] Endoscopist: Victory L. 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 The patient's tolerance of                            previous anesthesia was also reviewed. The risks                            and benefits of the procedure and the sedation                            options and risks were discussed with the patient.                            All questions were answered, and informed consent                            was obtained. Prior Anticoagulants: The patient has                            taken no anticoagulant or antiplatelet agents. ASA                            Grade Assessment: III - A patient with severe  systemic disease. After reviewing the risks and                            benefits, the patient was deemed in satisfactory                            condition to undergo the procedure.                           After obtaining informed consent, the colonoscope                            was passed under direct vision. Throughout the                            procedure, the patient's blood pressure, pulse, and                             oxygen saturations were monitored continuously. The                            CF [id number] #[id number] was introduced through the anus                            and advanced to the the cecum, identified by                            appendiceal orifice and ileocecal valve. The                            colonoscopy was extremely difficult due to a                            redundant colon. Successful completion of the                            procedure was aided by changing the patient to a                            semi-prone position, using manual pressure                            (mulitple locations), straightening and shortening                            the scope to obtain bowel loop reduction and                            lavage. The patient tolerated the procedure well.                            The quality of the bowel preparation was good after  lavage. The ileocecal valve, appendiceal orifice,                            and rectum were photographed. The bowel preparation                            used was SUPREP. Scope In: 2:59:23 PM Scope Out: 3:25:43 PM Scope Withdrawal Time: 0 hours 15 minutes 22 seconds  Total Procedure Duration: 0 hours 26 minutes 20 seconds  Findings:                 The perianal and digital rectal examinations were                            normal.                           Repeat examination of right colon under NBI                            performed.WL and NBI also used throughout the                            remainder of the colon.                           Multiple diverticula were found in the left colon                            and right colon.                           Normal mucosa was found in the entire colon.                            Several biopsies were obtained in the rectum, in                            the sigmoid colon, in the descending colon, in the                             proximal transverse colon, in the distal transverse                            colon, in the proximal ascending colon and in the                            distal ascending colon with cold forceps for                            ulcerative colitis surveillance.                           Retroflexion in the rectum was not performed due to  anatomy.                           The exam was otherwise without abnormality. Complications:            No immediate complications. Estimated Blood Loss:     Estimated blood loss was minimal. Impression:               - Diverticulosis in the left colon and in the right                            colon.                           - Normal mucosa in the entire examined colon.                           - The examination was otherwise normal.                           - Several biopsies were obtained in the rectum, in                            the sigmoid colon, in the descending colon, in the                            proximal transverse colon, in the distal transverse                            colon, in the proximal ascending colon and in the                            distal ascending colon. Recommendation:           - Patient has a contact number available for                            emergencies. The signs and symptoms of potential                            delayed complications were discussed with the                            patient. Return to normal activities tomorrow.                            Written discharge instructions were provided to the                            patient.                           - Resume previous diet.                           - Continue present medications.                           -  Await pathology results.                           - Repeat colonoscopy in 2 years for surveillance.                            Golytely prep for all future colonoscopies                             (redundant colon) Holbert Caples L. Legrand, MD 08/09/2023 3:33:34 PM This report has been signed electronically.

## 2023-08-09 NOTE — Progress Notes (Signed)
 Called to room to assist during endoscopic procedure.  Patient ID and intended procedure confirmed with present staff. Received instructions for my participation in the procedure from the performing physician.

## 2023-08-10 ENCOUNTER — Telehealth: Payer: Self-pay | Admitting: *Deleted

## 2023-08-10 NOTE — Telephone Encounter (Signed)
  Follow up Call-     08/09/2023    2:18 PM 01/22/2021    9:05 AM  Call back number  Post procedure Call Back phone  # [phone]  Permission to leave phone message  Yes     Patient questions:  Do you have a fever, pain , or abdominal swelling? No. Pain Score  0 *  Have you tolerated food without any problems? Yes.    Have you been able to return to your normal activities? Yes.    Do you have any questions about your discharge instructions: Diet   No. Medications  No. Follow up visit  No.  Do you have questions or concerns about your Care? No.  Actions: * If pain score is 4 or above: No action needed, pain <4.

## 2023-08-12 LAB — SURGICAL PATHOLOGY

## 2023-08-17 ENCOUNTER — Ambulatory Visit: Payer: Self-pay | Admitting: Gastroenterology

## 2023-08-20 ENCOUNTER — Ambulatory Visit: Admitting: Student in an Organized Health Care Education/Training Program

## 2023-08-26 ENCOUNTER — Other Ambulatory Visit: Payer: Self-pay

## 2023-08-26 ENCOUNTER — Other Ambulatory Visit (HOSPITAL_COMMUNITY): Payer: Self-pay

## 2023-08-26 NOTE — Progress Notes (Signed)
 Specialty Pharmacy Refill Coordination Note  Robert Park is a 73 y.o. male assessed today regarding refills of clinic administered specialty medication(s) Cabotegravir  & Rilpivirine  (CABENUVA )   Clinic requested Courier to Provider Office   Delivery date: 08/30/23   Verified address: [address] [place] [address]   Medication will be filled on 08/27/23.

## 2023-08-27 ENCOUNTER — Other Ambulatory Visit: Payer: Self-pay

## 2023-08-30 ENCOUNTER — Telehealth: Payer: Self-pay

## 2023-08-30 NOTE — Telephone Encounter (Signed)
 [facility] Patient Advocate Encounter  Patient's medications Cabenuva  have been couriered to [facility] from [facility] and will be administered at the patients appointment on 09/03/23.  Arland Hutchinson, CPhT Specialty Pharmacy Patient [facility] Phone: [phone] Fax:  [phone]

## 2023-09-03 ENCOUNTER — Other Ambulatory Visit: Payer: Self-pay

## 2023-09-03 ENCOUNTER — Ambulatory Visit: Admitting: Family

## 2023-09-03 ENCOUNTER — Encounter: Payer: Self-pay | Admitting: Family

## 2023-09-03 VITALS — BP 141/83 | HR 66 | Temp 98.5°F | Ht 67.0 in | Wt 222.0 lb

## 2023-09-03 DIAGNOSIS — B20 Human immunodeficiency virus [HIV] disease: Secondary | ICD-10-CM | POA: Diagnosis not present

## 2023-09-03 DIAGNOSIS — Z9189 Other specified personal risk factors, not elsewhere classified: Secondary | ICD-10-CM

## 2023-09-03 DIAGNOSIS — Z0000 Encounter for general adult medical examination without abnormal findings: Secondary | ICD-10-CM

## 2023-09-03 DIAGNOSIS — Z79899 Other long term (current) drug therapy: Secondary | ICD-10-CM

## 2023-09-03 DIAGNOSIS — Z113 Encounter for screening for infections with a predominantly sexual mode of transmission: Secondary | ICD-10-CM

## 2023-09-03 MED ORDER — CABOTEGRAVIR & RILPIVIRINE ER 600 & 900 MG/3ML IM SUER
1.0000 | Freq: Once | INTRAMUSCULAR | Status: AC
  Administered 2023-09-03: 1 via INTRAMUSCULAR

## 2023-09-03 NOTE — Assessment & Plan Note (Signed)
 Robert Park continues to have well-controlled virus with good adherence and tolerance to Cabenuva .  Reviewed previous lab work and discussed plan of care and U equals U.  Next injection of Cabenuva  provided with no complications.  Check blood work.  Plan for follow-up in 6 months or sooner if needed with lab work on the same day and pharmacy providers in between.

## 2023-09-03 NOTE — Assessment & Plan Note (Signed)
 Discussed importance of safe sexual practice and condom use. Condoms and site specific STD testing offered.  Vaccinations reviewed.  Colon cancer screening up to date.  Routine dental care up to date.

## 2023-09-03 NOTE — Progress Notes (Signed)
 Brief Narrative   Patient ID: Robert Park, male    DOB: December 05, 1950, 73 y.o.   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  Previous history of CMV colitis in 2010-2011.  Initial CD4 count of 450 with viral load of 21,000.  Entered care and CDC stage II.  [id number] negative. Previous medications include Atripla and Genovya.  Currently on Cabenuva .    Subjective:   Chief Complaint  Patient presents with   Follow-up    B20    HPI:  Robert Park is a 73 y.o. male with HIV disease last seen on 07/07/23 by Alan Geralds, RPH-CPP with well controlled virus and good adherence and tolerance to Cabenuva . Viral load was undetectable and CD4 count 724. Kidney function, liver function and electrolytes within normal ranges. Here today for routine follow up.  Robert Park has been doing well since his last office visit and continues to receive Cabenuva  with no adverse side effects and minimal soreness following injections. Covered by [employer]. No new concerns/complaints. Has been on a few trips recently. 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 Housing, transportation, and access to food stable. Healthcare maintenance reviewed.    Denies fevers, chills, night sweats, headaches, changes in vision, neck pain/stiffness, nausea, diarrhea, vomiting, lesions or rashes.  Lab Results  Component Value Date   CD4TCELL 36 03/12/2023   CD4TABS 737 09/21/2022   Lab Results  Component Value Date   HIV1RNAQUANT Not Detected 03/12/2023     Allergies  Allergen Reactions   Elemental Sulfur Hives, Shortness Of Breath and Swelling   Prednisone Other (See Comments)    Severe colitis   Sulfamethoxazole-Trimethoprim Hives   Statins Other (See Comments)    Muscle pains, GI symptoms      Outpatient Medications Prior to Visit  Medication Sig Dispense Refill   Adalimumab  (HUMIRA ) 40 MG/0.4ML PSKT Inject 1 pen  into the  skin every 14 (fourteen) days. 2 each 11   albuterol  (VENTOLIN  HFA) 108 (90 Base) MCG/ACT inhaler INHALE 1 PUFF INTO THE LUNGS EVERY 6 HOURS AS NEEDED FOR WHEEZING OR SHORTNESS OF BREATH 6.7 g 1   cabotegravir  & rilpivirine  ER (CABENUVA ) 600 & 900 MG/3ML injection Inject 1 kit into the muscle every 2 (two) months. 6 mL 5   ezetimibe  (ZETIA ) 10 MG tablet Take 1 tablet (10 mg total) by mouth daily. 90 tablet 3   fluticasone  (FLONASE ) 50 MCG/ACT nasal spray Place 1 spray into both nostrils daily. 48 g 3   fluticasone  (FLOVENT  HFA) 110 MCG/ACT inhaler Inhale 2 puffs into the lungs in the morning and at bedtime. 3 each 3   losartan  (COZAAR ) 50 MG tablet Take 1 tablet (50 mg total) by mouth daily. 90 tablet 3   Olopatadine  HCl 0.6 % SOLN Place 1 spray into the nose daily. 93 g 3   tadalafil  (CIALIS ) 10 MG tablet Take 0.5 tablets (5 mg total) by mouth daily as needed for erectile dysfunction. 20 tablet 2   Vitamin D , Ergocalciferol , (DRISDOL ) 1.25 MG (50000 UNIT) CAPS capsule Take 50,000 Units by mouth every 7 (seven) days.     DULoxetine  (CYMBALTA ) 30 MG capsule Take 1 capsule (30 mg total) by mouth daily. 90 capsule 3   cetirizine  (ZYRTEC ) 10 MG tablet Take 1 tablet (10 mg total) by mouth daily. (Patient not taking: Reported on 09/03/2023) 90 tablet 3   Semaglutide ,0.25 or 0.5MG /DOS, 2 MG/3ML SOPN  Inject 0.25 mg into the skin once a week. (Patient not taking: Reported on 09/03/2023) 3 mL 1   No facility-administered medications prior to visit.     Past Medical History:  Diagnosis Date   Allergy    Anxiety    Arthritis    Asthma    Cataract    Bilateral eyes   CMV colitis (HCC)    Depression    Diverticulosis    Dyslipidemia    ED (erectile dysfunction)    GERD (gastroesophageal reflux disease)    History of colon polyps    HIV infection (HCC)    Hyperlipidemia    Hypertension    Internal hemorrhoids    Osteopenia    Shingles    Sleep apnea    does not wear a c-pap   Ulcerative  colitis (HCC)    Vitamin D  deficiency      Past Surgical History:  Procedure Laterality Date   CARPAL TUNNEL RELEASE Right    x 2   CARPAL TUNNEL RELEASE Left    x 2   CATARACT EXTRACTION, BILATERAL Bilateral 10/2017   COLONOSCOPY     HEMORRHOID SURGERY     MOUTH SURGERY     TONSILLECTOMY AND ADENOIDECTOMY     age 2   UPPER GASTROINTESTINAL ENDOSCOPY     VASECTOMY          Review of Systems  Constitutional:  Negative for appetite change, chills, fatigue, fever and unexpected weight change.  Eyes:  Negative for visual disturbance.  Respiratory:  Negative for cough, chest tightness, shortness of breath and wheezing.   Cardiovascular:  Negative for chest pain and leg swelling.  Gastrointestinal:  Negative for abdominal pain, constipation, diarrhea, nausea and vomiting.  Genitourinary:  Negative for dysuria, flank pain, frequency, genital sores, hematuria and urgency.  Skin:  Negative for rash.  Allergic/Immunologic: Negative for immunocompromised state.  Neurological:  Negative for dizziness and headaches.     Objective:   BP (!) 141/83   Pulse 66   Temp 98.5 F (36.9 C) (Oral)   Ht 5' 7 (1.702 m)   Wt 222 lb (100.7 kg)   SpO2 97%   BMI 34.77 kg/m  Nursing note and vital signs reviewed.  Physical Exam Constitutional:      General: He is not in acute distress.    Appearance: He is well-developed.  Eyes:     Conjunctiva/sclera: Conjunctivae normal.  Cardiovascular:     Rate and Rhythm: Normal rate and regular rhythm.     Heart sounds: Normal heart sounds. No murmur heard.    No friction rub. No gallop.  Pulmonary:     Effort: Pulmonary effort is normal. No respiratory distress.     Breath sounds: Normal breath sounds. No wheezing or rales.  Chest:     Chest wall: No tenderness.  Abdominal:     General: Bowel sounds are normal.     Palpations: Abdomen is soft.     Tenderness: There is no abdominal tenderness.  Musculoskeletal:     Cervical back: Neck  supple.  Lymphadenopathy:     Cervical: No cervical adenopathy.  Skin:    General: Skin is warm and dry.     Findings: No rash.  Neurological:     Mental Status: He is alert and oriented to person, place, and time.  Psychiatric:        Mood and Affect: Mood normal.        Behavior: Behavior normal.  03/12/2023    9:02 AM 11/09/2022   10:18 AM 09/17/2022   10:56 AM 07/27/2022   11:17 AM 06/01/2022   11:04 AM  Depression screen PHQ 2/9  Decreased Interest 0 0 0 0 0  Down, Depressed, Hopeless 0 0 0 0 0  PHQ - 2 Score 0 0 0 0 0         No data to display           The 10-year ASCVD risk score (Park DK, et al., 2019) is: 47.7%   Values used to calculate the score:     Age: 89 years     Clincally relevant sex: Male     Is Non-Hispanic African American: No     Diabetic: Yes     Tobacco smoker: No     Systolic Blood Pressure: 141 mmHg     Is BP treated: Yes     HDL Cholesterol: 60 mg/dL     Total Cholesterol: 259 mg/dL      Assessment & Plan:    Patient Active Problem List   Diagnosis Date Noted   At increased risk for cardiovascular disease 09/03/2023   Hypercalcemia 02/15/2023   Mild persistent asthma, uncomplicated 07/02/2020   Seasonal and perennial allergic rhinitis 07/02/2020   Allergic sinusitis 02/05/2020   Hypertension 08/07/2019   Diabetes (HCC) 08/07/2019   Hyperlipidemia 08/07/2019   GAD (generalized anxiety disorder) 08/07/2019   Localized osteoarthritis of left hand 08/07/2019   Colon polyps 06/06/2019   Human immunodeficiency virus (HIV) disease (HCC) 02/16/2019   Screening for STDs (sexually transmitted diseases) 02/16/2019   Osteopenia 02/16/2019   Healthcare maintenance 02/16/2019   Ulcerative colitis without complications (HCC) 02/16/2019     Problem List Items Addressed This Visit       Other   Human immunodeficiency virus (HIV) disease (HCC) - Primary (Chronic)   Robert Park continues to have well-controlled virus with good  adherence and tolerance to Cabenuva .  Reviewed previous lab work and discussed plan of care and U equals U.  Next injection of Cabenuva  provided with no complications.  Check blood work.  Plan for follow-up in 6 months or sooner if needed with lab work on the same day and pharmacy providers in between.      Relevant Orders   Comprehensive metabolic panel with GFR   HIV-1 RNA quant-no reflex-bld   T-helper cells (CD4) count (not at [facility])   Screening for STDs (sexually transmitted diseases) (Chronic)   Relevant Orders   RPR   Healthcare maintenance (Chronic)   Discussed importance of safe sexual practice and condom use. Condoms and site specific STD testing offered.  Vaccinations reviewed.  Colon cancer screening up to date.  Routine dental care up to date.       At increased risk for cardiovascular disease   Robert Park has 10 year ASCVD risk core of 47.7%. Intolerant of statins and on ezetimibe . Continue working on modifiable risk factors.       Other Visit Diagnoses       Pharmacologic therapy       Relevant Orders   Lipid panel        I have discontinued Elsie Lorrene Bill's cetirizine  and Semaglutide (0.25 or 0.5MG /DOS). I am also having him maintain his Humira  (2 Syringe), DULoxetine , losartan , ezetimibe , tadalafil , fluticasone , fluticasone , Olopatadine  HCl, cabotegravir  & rilpivirine  ER, albuterol , and Vitamin D  (Ergocalciferol ). We administered cabotegravir  & rilpivirine  ER.   Meds ordered this encounter  Medications   cabotegravir  & rilpivirine   ER (CABENUVA ) 600 & 900 MG/3ML injection 1 kit     Follow-up: Return in about 6 months (around 03/05/2024). or sooner if needed.    Cathlyn July, MSN, FNP-C Nurse Practitioner [facility] for Infectious Disease [facility] Main number: [phone]

## 2023-09-03 NOTE — Assessment & Plan Note (Addendum)
 Mr. Moronta has 10 year ASCVD risk core of 47.7%. Intolerant of statins and on ezetimibe . Continue working on modifiable risk factors.

## 2023-09-03 NOTE — Patient Instructions (Addendum)
 Nice to see you.  We will check your lab work today.  Continue to take your medication daily as prescribed.  Plan for follow up in 6 months or sooner if needed with lab work on the same day and with pharmacy team in between.   Have a great day and stay safe!

## 2023-09-07 LAB — COMPREHENSIVE METABOLIC PANEL WITH GFR
AG Ratio: 2 (calc) (ref 1.0–2.5)
ALT: 21 U/L (ref 9–46)
AST: 15 U/L (ref 10–35)
Albumin: 5 g/dL (ref 3.6–5.1)
Alkaline phosphatase (APISO): 81 U/L (ref 35–144)
BUN: 11 mg/dL (ref 7–25)
CO2: 27 mmol/L (ref 20–32)
Calcium: 10.6 mg/dL — ABNORMAL HIGH (ref 8.6–10.3)
Chloride: 100 mmol/L (ref 98–110)
Creat: 0.81 mg/dL (ref 0.70–1.28)
Globulin: 2.5 g/dL (ref 1.9–3.7)
Glucose, Bld: 154 mg/dL — ABNORMAL HIGH (ref 65–99)
Potassium: 4.5 mmol/L (ref 3.5–5.3)
Sodium: 137 mmol/L (ref 135–146)
Total Bilirubin: 0.4 mg/dL (ref 0.2–1.2)
Total Protein: 7.5 g/dL (ref 6.1–8.1)
eGFR: 94 mL/min/1.73m2 (ref >=60)

## 2023-09-07 LAB — T-HELPER CELLS (CD4) COUNT (NOT AT ARMC)
Absolute CD4: 1116 {cells}/uL (ref 490–1740)
CD4 T Helper %: 39 % (ref 30–61)
Total lymphocyte count: 2872 {cells}/uL (ref 850–3900)

## 2023-09-07 LAB — LIPID PANEL
Cholesterol: 306 mg/dL — ABNORMAL HIGH (ref <200)
HDL: 77 mg/dL (ref >=40)
LDL Cholesterol (Calc): 197 mg/dL — ABNORMAL HIGH
Non-HDL Cholesterol (Calc): 229 mg/dL — ABNORMAL HIGH (ref <130)
Total CHOL/HDL Ratio: 4 (calc) (ref <5.0)
Triglycerides: 153 mg/dL — ABNORMAL HIGH (ref <150)

## 2023-09-07 LAB — HIV-1 RNA QUANT-NO REFLEX-BLD
HIV 1 RNA Quant: NOT DETECTED {copies}/mL
HIV-1 RNA Quant, Log: NOT DETECTED {Log_copies}/mL

## 2023-09-07 LAB — RPR: RPR Ser Ql: NONREACTIVE

## 2023-09-09 ENCOUNTER — Ambulatory Visit: Payer: Self-pay | Admitting: Family

## 2023-09-17 ENCOUNTER — Ambulatory Visit (INDEPENDENT_AMBULATORY_CARE_PROVIDER_SITE_OTHER): Admitting: Student in an Organized Health Care Education/Training Program

## 2023-09-17 ENCOUNTER — Encounter: Payer: Self-pay | Admitting: Student in an Organized Health Care Education/Training Program

## 2023-09-17 VITALS — BP 184/95 | HR 74

## 2023-09-17 DIAGNOSIS — E785 Hyperlipidemia, unspecified: Secondary | ICD-10-CM | POA: Diagnosis not present

## 2023-09-17 DIAGNOSIS — I10 Essential (primary) hypertension: Secondary | ICD-10-CM | POA: Diagnosis not present

## 2023-09-17 DIAGNOSIS — M19042 Primary osteoarthritis, left hand: Secondary | ICD-10-CM | POA: Diagnosis not present

## 2023-09-17 DIAGNOSIS — Z23 Encounter for immunization: Secondary | ICD-10-CM

## 2023-09-17 DIAGNOSIS — E119 Type 2 diabetes mellitus without complications: Secondary | ICD-10-CM

## 2023-09-17 DIAGNOSIS — F32A Depression, unspecified: Secondary | ICD-10-CM

## 2023-09-17 LAB — POCT GLYCOSYLATED HEMOGLOBIN (HGB A1C): Hemoglobin A1C: 7.7 % — AB (ref 4.0–5.6)

## 2023-09-17 LAB — MICROALBUMIN / CREATININE URINE RATIO
Creatinine,U: 69.1 mg/dL
Microalb Creat Ratio: 40.3 mg/g — ABNORMAL HIGH (ref 0.0–30.0)
Microalb, Ur: 2.8 mg/dL — ABNORMAL HIGH (ref 0.0–1.9)

## 2023-09-17 MED ORDER — EZETIMIBE 10 MG PO TABS: 10.0000 mg | ORAL_TABLET | Freq: Every day | ORAL | 3 refills | Status: AC

## 2023-09-17 MED ORDER — EMPAGLIFLOZIN 10 MG PO TABS: 10.0000 mg | ORAL_TABLET | Freq: Every day | ORAL | 3 refills | Status: AC

## 2023-09-17 MED ORDER — LOSARTAN POTASSIUM 50 MG PO TABS: 50.0000 mg | ORAL_TABLET | Freq: Every day | ORAL | 3 refills | Status: AC

## 2023-09-17 MED ORDER — VITAMIN D (ERGOCALCIFEROL) 1.25 MG (50000 UNIT) PO CAPS: 50000.0000 [IU] | ORAL_CAPSULE | ORAL | 0 refills | Status: DC

## 2023-09-17 MED ORDER — DULOXETINE HCL 30 MG PO CPEP: 30.0000 mg | ORAL_CAPSULE | Freq: Every day | ORAL | 3 refills | Status: AC

## 2023-09-17 NOTE — Assessment & Plan Note (Signed)
 Chronic and a little worsening.  A1c today is 7.7%, above our goal.  He has tried metformin  in the past as well as Ozempic .  Could not tolerate those medicines because of GI side effects.  Currently he is doing only lifestyle modifications, but not adequate control.  We talked about starting SGLT2 inhibitor.  Will prescribe Jardiance  10 mg to be taken once daily.  Follow-up in 3 months for repeat A1c.  Check urine microalbumin when able.  Foot exam done today.  Continue losartan  for renal protection.  Intolerant to statins and currently on ezetimibe .

## 2023-09-17 NOTE — Patient Instructions (Signed)
  VISIT SUMMARY: Today, we addressed your medication management issues, pre-surgical evaluation, and several ongoing health concerns. We discussed your hypertension, diabetes, osteoarthritis, and upcoming surgery. We also administered your flu shot and reviewed your overall wellness.  YOUR PLAN: -ESSENTIAL HYPERTENSION: Essential hypertension means high blood pressure without a known secondary cause. Your blood pressure was high today due to missed doses of losartan . Restart taking losartan  50 mg daily and monitor your blood pressure regularly to ensure it is controlled before your surgery.  -TYPE 2 DIABETES MELLITUS: Type 2 diabetes is a condition where your body does not use insulin properly, leading to high blood sugar levels. Your last A1c was 7.4% in April. We will check your A1c again before your surgery. If it remains above 7%, we will discuss alternative diabetes medications to better manage your blood sugar levels.  -PRIMARY OSTEOARTHRITIS, LEFT HAND: Primary osteoarthritis is a type of arthritis that occurs when the protective cartilage on the ends of your bones wears down over time. You are scheduled for bone replacement and carpal tunnel surgery on November 3rd to address the pain in your thumb and shoulder. Ensure you complete the pre-operative clearance before your surgery.  -ADULT WELLNESS VISIT: An adult wellness visit is a routine check-up to review your overall health. We administered your flu shot today and performed a wellness check to ensure you are ready for your upcoming surgery. Make sure to complete any pre-operative clearance forms provided by your surgeon's office.  INSTRUCTIONS: Please restart taking your losartan  50 mg daily and monitor your blood pressure regularly. We will check your hemoglobin A1c before your surgery. Ensure you complete the pre-operative clearance for your surgery on November 3rd. If you have any questions or concerns, please contact our  office.

## 2023-09-17 NOTE — Progress Notes (Signed)
 Established Patient Office Visit  Subjective   Patient ID: Robert Park, male    DOB: 25-Jun-1950  Age: 73 y.o. MRN: [id number]  HPI  Discussed the use of AI scribe software for clinical note transcription with the patient, who gave verbal consent to proceed.  History of Present Illness Robert Park is a 73 year old male with hypertension and type 2 diabetes who presents with medication management issues and pre-surgical evaluation.  He has been experiencing issues with his medication supply, particularly with his blood pressure medication, losartan , which he has been out of for a week. Additionally, he has been out of duloxetine  for two weeks due to an accidental spill. He has not taken his vitamin D  50,000 units for over a month and a half. He continues to take Humira , cetirizine , and Flonase .  He has a history of type 2 diabetes with a previous A1c of 7.4% in April. He has experienced significant weight fluctuations, having lost 35 pounds and then regained 25 pounds, currently weighing 220 pounds. He is monitoring his sugar intake and is interested in alternative diabetes medications due to side effects from previous treatments, including severe constipation and nausea.  He has a history of ulcerative colitis and notes that Humira  has been effective in managing his symptoms. He experienced significant gastrointestinal side effects with previous diabetes medications, which were exacerbated by Humira .  He reports pain in his shoulder and neck, with a partial rotator cuff tear and bone wear in the neck identified on MRI.  He experiences occasional chest pain and pressure, particularly around his neck and shoulder, but does not report any consistent pattern related to activity.  He mentions having hearing aids that are Bluetooth-enabled but did not wear them to the appointment. He works for [employer] and has insurance for short-term disability. He has children aged 75 and 16  who recently visited him.     Objective:     BP (!) 184/95 (BP Location: Left Arm, Patient Position: Sitting, Cuff Size: Normal)   Pulse 74    Physical Exam  Gen: Well-appearing man Heart: Regular, no murmur Lungs: Unlabored, clear throughout Abd: Soft, midline large ventral hernia is nontender and easily reducible, no skin changes Ext: Warm, no edema   Results for orders placed or performed in visit on 09/17/23  POCT glycosylated hemoglobin (Hb A1C)  Result Value Ref Range   Hemoglobin A1C 7.7 (A) 4.0 - 5.6 %   HbA1c POC (<> result, manual entry)     HbA1c, POC (prediabetic range)     HbA1c, POC (controlled diabetic range)        Assessment & Plan:   Problem List Items Addressed This Visit       High   Hypertension (Chronic)   Chronic asymptomatic severe uncontrolled hypertension.  Blood pressure currently uncontrolled because he has been out of losartan  over the last 1 week.  This is asymptomatic.  Will restart losartan  50 mg daily.  I suspect we will need a second agent as well, but patient is pretty sensitive to medications and so we will start out with the losartan .  If still elevated at next visit, we will add a second agent.      Relevant Medications   ezetimibe  (ZETIA ) 10 MG tablet   losartan  (COZAAR ) 50 MG tablet   Diabetes (HCC) - Primary (Chronic)   Chronic and a little worsening.  A1c today is 7.7%, above our goal.  He has tried metformin  in the past as  well as Ozempic .  Could not tolerate those medicines because of GI side effects.  Currently he is doing only lifestyle modifications, but not adequate control.  We talked about starting SGLT2 inhibitor.  Will prescribe Jardiance  10 mg to be taken once daily.  Follow-up in 3 months for repeat A1c.  Check urine microalbumin when able.  Foot exam done today.  Continue losartan  for renal protection.  Intolerant to statins and currently on ezetimibe .      Relevant Medications   losartan  (COZAAR ) 50 MG tablet    empagliflozin  (JARDIANCE ) 10 MG TABS tablet   Other Relevant Orders   POCT glycosylated hemoglobin (Hb A1C) (Completed)   Microalbumin / creatinine urine ratio     Low   Localized osteoarthritis of left hand (Chronic)   He is scheduled for bone replacement and carpal tunnel surgery on November 3rd and reports significant pain in the thumb and shoulder. Proceed with the scheduled hand surgery on November 3rd.  I recommend continuing all his medications through the surgery.  Charlanne perioperative risk assessment is low risk at 0.2% risk of MI or cardiac arrest at 30 days.  I did recommend we get better blood pressure control before the surgery.  He is restarting antihypertensive medications today.  Diabetes was above goal as well and we are starting Jardiance .  Other chronic medical conditions seem well controlled.       Relevant Medications   DULoxetine  (CYMBALTA ) 30 MG capsule   Other Visit Diagnoses       Dyslipidemia       Relevant Medications   ezetimibe  (ZETIA ) 10 MG tablet   losartan  (COZAAR ) 50 MG tablet     Depression, unspecified depression type       Relevant Medications   DULoxetine  (CYMBALTA ) 30 MG capsule   ezetimibe  (ZETIA ) 10 MG tablet   losartan  (COZAAR ) 50 MG tablet     Needs flu shot       Relevant Orders   Flu vaccine HIGH DOSE PF(Fluzone Trivalent) (Completed)       Return in about 3 months (around 12/17/2023).    Cleatus Debby Specking, MD

## 2023-09-17 NOTE — Assessment & Plan Note (Signed)
 He is scheduled for bone replacement and carpal tunnel surgery on November 3rd and reports significant pain in the thumb and shoulder. Proceed with the scheduled hand surgery on November 3rd.  I recommend continuing all his medications through the surgery.  Charlanne perioperative risk assessment is low risk at 0.2% risk of MI or cardiac arrest at 30 days.  I did recommend we get better blood pressure control before the surgery.  He is restarting antihypertensive medications today.  Diabetes was above goal as well and we are starting Jardiance .  Other chronic medical conditions seem well controlled.

## 2023-09-17 NOTE — Assessment & Plan Note (Signed)
 Chronic asymptomatic severe uncontrolled hypertension.  Blood pressure currently uncontrolled because he has been out of losartan  over the last 1 week.  This is asymptomatic.  Will restart losartan  50 mg daily.  I suspect we will need a second agent as well, but patient is pretty sensitive to medications and so we will start out with the losartan .  If still elevated at next visit, we will add a second agent.

## 2023-09-20 ENCOUNTER — Encounter: Admitting: Gastroenterology

## 2023-09-20 ENCOUNTER — Ambulatory Visit: Payer: Self-pay | Admitting: Student in an Organized Health Care Education/Training Program

## 2023-09-24 ENCOUNTER — Telehealth: Payer: Self-pay

## 2023-09-24 NOTE — Telephone Encounter (Signed)
 RN from [facility] is suggesting statin therapy for patient who is diabetic per to ADA guidelines if Dr.Vincent sees fit, RN states she will be faxing paper work explaining this as well.

## 2023-09-24 NOTE — Telephone Encounter (Signed)
 Copied from CRM #[id number]. Topic: General - Other >> Sep 23, 2023  5:23 PM Dedra B wrote: Reason for CRM: Rosina from [facility] called regarding pt. She said pt therapy should be reevaluated to include a statin since he is a diabetic per ADA guidelines. She would like nurse to call her at [phone]

## 2023-10-19 ENCOUNTER — Other Ambulatory Visit: Payer: Self-pay

## 2023-10-19 ENCOUNTER — Other Ambulatory Visit (HOSPITAL_COMMUNITY): Payer: Self-pay

## 2023-10-21 NOTE — Telephone Encounter (Signed)
 Called [employer] to relay message below from Dr.Vincent

## 2023-10-21 NOTE — Telephone Encounter (Signed)
Patient is intolerant to statins

## 2023-10-27 ENCOUNTER — Other Ambulatory Visit (HOSPITAL_COMMUNITY): Payer: Self-pay

## 2023-10-27 ENCOUNTER — Other Ambulatory Visit: Payer: Self-pay

## 2023-10-27 NOTE — Progress Notes (Signed)
 Specialty Pharmacy Refill Coordination Note  Robert Park is a 73 y.o. male assessed today regarding refills of clinic administered specialty medication(s) Cabotegravir  & Rilpivirine  (CABENUVA )   Clinic requested Courier to Provider Office   Delivery date: 11/01/23   Verified address: [address] [place] [address]   Medication will be filled on 10/29/23.

## 2023-10-29 ENCOUNTER — Other Ambulatory Visit: Payer: Self-pay

## 2023-11-01 ENCOUNTER — Telehealth: Payer: Self-pay

## 2023-11-01 NOTE — Telephone Encounter (Signed)
 [facility] Patient Advocate Encounter  Patient's medications Cabenuva  have been couriered to [facility] from [facility] and will be administered at the patients appointment on 11/05/23.  Arland Hutchinson, CPhT Specialty Pharmacy Patient [facility] for Infectious Disease Phone: [phone] Fax:  [phone]

## 2023-11-02 NOTE — Progress Notes (Unsigned)
 HPI: Robert Park is a 73 y.o. male who presents to the [facility] pharmacy clinic for Cabenuva  administration.  Referring ID Physician: Cathlyn July, NP  Patient Active Problem List   Diagnosis Date Noted   At increased risk for cardiovascular disease 09/03/2023   Hypercalcemia 02/15/2023   Mild persistent asthma, uncomplicated 07/02/2020   Seasonal and perennial allergic rhinitis 07/02/2020   Allergic sinusitis 02/05/2020   Hypertension 08/07/2019   Diabetes (HCC) 08/07/2019   Hyperlipidemia 08/07/2019   GAD (generalized anxiety disorder) 08/07/2019   Localized osteoarthritis of left hand 08/07/2019   Colon polyps 06/06/2019   Human immunodeficiency virus (HIV) disease (HCC) 02/16/2019   Screening for STDs (sexually transmitted diseases) 02/16/2019   Osteopenia 02/16/2019   Healthcare maintenance 02/16/2019   Ulcerative colitis without complications (HCC) 02/16/2019    Patient's Medications  New Prescriptions   No medications on file  Previous Medications   ADALIMUMAB  (HUMIRA ) 40 MG/0.4ML PSKT    Inject 1 pen  into the skin every 14 (fourteen) days.   ALBUTEROL  (VENTOLIN  HFA) 108 (90 BASE) MCG/ACT INHALER    INHALE 1 PUFF INTO THE LUNGS EVERY 6 HOURS AS NEEDED FOR WHEEZING OR SHORTNESS OF BREATH   CABOTEGRAVIR  & RILPIVIRINE  ER (CABENUVA ) 600 & 900 MG/3ML INJECTION    Inject 1 kit into the muscle every 2 (two) months.   DULOXETINE  (CYMBALTA ) 30 MG CAPSULE    Take 1 capsule (30 mg total) by mouth daily.   EMPAGLIFLOZIN  (JARDIANCE ) 10 MG TABS TABLET    Take 1 tablet (10 mg total) by mouth daily.   EZETIMIBE  (ZETIA ) 10 MG TABLET    Take 1 tablet (10 mg total) by mouth daily.   FLUTICASONE  (FLONASE ) 50 MCG/ACT NASAL SPRAY    Place 1 spray into both nostrils daily.   FLUTICASONE  (FLOVENT  HFA) 110 MCG/ACT INHALER    Inhale 2 puffs into the lungs in the morning and at bedtime.   LOSARTAN  (COZAAR ) 50 MG TABLET    Take 1 tablet (50 mg total) by mouth daily.   OLOPATADINE  HCL 0.6 % SOLN     Place 1 spray into the nose daily.   TADALAFIL  (CIALIS ) 10 MG TABLET    Take 0.5 tablets (5 mg total) by mouth daily as needed for erectile dysfunction.   VITAMIN D , ERGOCALCIFEROL , (DRISDOL ) 1.25 MG (50000 UNIT) CAPS CAPSULE    Take 1 capsule (50,000 Units total) by mouth every 7 (seven) days.  Modified Medications   No medications on file  Discontinued Medications   No medications on file    Allergies: Allergies  Allergen Reactions   Elemental Sulfur Hives, Shortness Of Breath and Swelling   Prednisone Other (See Comments)    Severe colitis   Sulfamethoxazole-Trimethoprim Hives   Statins Other (See Comments)    Muscle pains, GI symptoms    Past Medical History: Past Medical History:  Diagnosis Date   Allergy    Anxiety    Arthritis    Asthma    Cataract    Bilateral eyes   CMV colitis (HCC)    Depression    Diverticulosis    Dyslipidemia    ED (erectile dysfunction)    GERD (gastroesophageal reflux disease)    History of colon polyps    HIV infection (HCC)    Hyperlipidemia    Hypertension    Internal hemorrhoids    Osteopenia    Shingles    Sleep apnea    does not wear a c-pap   Ulcerative colitis (HCC)  Vitamin D  deficiency     Social History: Social History   Socioeconomic History   Marital status: Divorced    Spouse name: Not on file   Number of children: 2   Years of education: Not on file   Highest education level: Not on file  Occupational History   Occupation: Building surveyor    Comment: Disabled   Occupation: Copywriter, advertising: [employer]    Comment: PT  Tobacco Use   Smoking status: Former    Current packs/day: 0.00    Types: Cigarettes    Quit date: 06/06/1987    Years since quitting: 36.4   Smokeless tobacco: Never   Tobacco comments:    quit 34 years ago  Vaping Use   Vaping status: Never Used  Substance and Sexual Activity   Alcohol use: Yes    Comment: every other day -social   Drug use: Not Currently    Types:  Marijuana    Comment: occassional usage - pt states last smoked 35 years ago   Sexual activity: Yes    Partners: Male    Comment: declined condoms  Other Topics Concern   Not on file  Social History Narrative   Current Social History 05/16/2020        Patient lives with house mate in a two level townhome with handrails inside and 1-2 outside steps without handrails       Patient's method of transportation is personal car.      The highest level of education was 2 years college.      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       FREDRIK Alcide, BSN, RN-BC       Social Drivers of Health   Financial Resource Strain: Low Risk  (05/27/2021)   Overall Financial Resource Strain (CARDIA)    Difficulty of Paying Living Expenses: Not hard at all  Food Insecurity: No Food Insecurity (08/20/2021)   Hunger Vital Sign    Worried About Running Out of Food in the Last Year: Never true    Ran Out of Food in the Last Year: Never true  Transportation Needs: No Transportation Needs (05/27/2021)   PRAPARE - Administrator, Civil Service (Medical): No    Lack of Transportation (Non-Medical): No  Physical Activity: Insufficiently Active (05/27/2021)   Exercise Vital Sign    Days of Exercise per Week: 2 days    Minutes of Exercise per Session: 60 min  Stress: No Stress Concern Present (05/27/2021)   [employer] of Occupational Health - Occupational Stress Questionnaire    Feeling of Stress : Not at all  Social Connections: Unknown (05/27/2021)   Social Connection and Isolation Panel     Frequency of Communication with Friends and Family: Twice a week    Frequency of Social Gatherings with Friends and Family: Not on file    Attends Religious Services: Never    Active Member of Clubs or Organizations: No  Attends Banker Meetings: Never    Marital Status: Divorced    Labs: Lab Results  Component Value Date   HIV1RNAQUANT NOT DETECTED 09/03/2023   HIV1RNAQUANT Not Detected 03/12/2023   HIV1RNAQUANT Not Detected 09/21/2022   CD4TABS 737 09/21/2022   CD4TABS 757 11/11/2021   CD4TABS 962 09/17/2020    RPR and STI Lab Results  Component Value Date   LABRPR NON-REACTIVE 09/03/2023   LABRPR REACTIVE (A) 03/11/2021   LABRPR REACTIVE (A) 08/26/2020   LABRPR REACTIVE (A) 07/16/2020   LABRPR REACTIVE (A) 02/23/2020   RPRTITER 1:2 (H) 03/11/2021   RPRTITER 1:4 (H) 08/26/2020   RPRTITER 1:4 (H) 07/16/2020   RPRTITER 1:32 (H) 02/23/2020   RPRTITER 1:1 (H) 02/01/2019    STI Results GC CT  05/08/2021  2:03 PM Negative    Negative    Negative  Negative    Negative    Negative   08/26/2020  4:05 PM Negative    Positive    Positive  Negative    Negative    Negative   02/01/2019  2:30 PM Negative  Negative     Hepatitis B Lab Results  Component Value Date   HEPBSAB REACTIVE (A) 02/01/2019   HEPBSAG NON-REACTIVE 02/01/2019   HEPBCAB NON-REACTIVE 02/01/2019   Hepatitis C Lab Results  Component Value Date   HEPCAB NON-REACTIVE 02/01/2019   Hepatitis A Lab Results  Component Value Date   HAV REACTIVE (A) 02/01/2019   Lipids: Lab Results  Component Value Date   CHOL 306 (H) 09/03/2023   TRIG 153 (H) 09/03/2023   HDL 77 09/03/2023   CHOLHDL 4.0 09/03/2023   LDLCALC 197 (H) 09/03/2023    TARGET DATE:  The 29th of the month  Assessment: Bill presents today for their maintenance Cabenuva  injections. Initial/past injections were tolerated well without issues. No problems with systemic effects of injections.   Administered cabotegravir   600mg /2mL in left upper outer quadrant of the gluteal muscle. Administered rilpivirine  900 mg/3mL in the right upper outer quadrant of the gluteal muscle. Monitored patient for 10 minutes after injection. Injections were tolerated well without issue. Patient will follow up in 2 months for next injection.  Eligible for COVID vaccination which was not discussed today.   Plan: - Cabenuva  injections administered - Next injections scheduled for 12/23 with Cassie   - Call with any issues or questions  Alan Geralds, PharmD, CPP, BCIDP, AAHIVP Clinical Pharmacist Practitioner Infectious Diseases Clinical Pharmacist [facility]

## 2023-11-05 ENCOUNTER — Other Ambulatory Visit: Payer: Self-pay

## 2023-11-05 ENCOUNTER — Ambulatory Visit: Payer: Self-pay | Admitting: Pharmacist

## 2023-11-05 DIAGNOSIS — B20 Human immunodeficiency virus [HIV] disease: Secondary | ICD-10-CM | POA: Diagnosis not present

## 2023-11-05 MED ORDER — CABOTEGRAVIR & RILPIVIRINE ER 600 & 900 MG/3ML IM SUER
1.0000 | Freq: Once | INTRAMUSCULAR | Status: AC
  Administered 2023-11-05: 1 via INTRAMUSCULAR

## 2023-12-14 ENCOUNTER — Other Ambulatory Visit: Payer: Self-pay | Admitting: Student in an Organized Health Care Education/Training Program

## 2023-12-14 NOTE — Telephone Encounter (Signed)
 Okay to refill?

## 2023-12-15 ENCOUNTER — Other Ambulatory Visit: Payer: Self-pay

## 2023-12-15 ENCOUNTER — Other Ambulatory Visit (HOSPITAL_COMMUNITY): Payer: Self-pay

## 2023-12-15 NOTE — Progress Notes (Signed)
 Specialty Pharmacy Refill Coordination Note  Robert Park is a 73 y.o. male assessed today regarding refills of clinic administered specialty medication(s) Cabotegravir  & Rilpivirine  (CABENUVA )   Clinic requested Courier to Provider Office   Delivery date: 12/29/23   Verified address: [address] [place] [address]   Medication will be filled on 12/28/23.

## 2023-12-17 ENCOUNTER — Ambulatory Visit: Admitting: Student in an Organized Health Care Education/Training Program

## 2024-01-03 NOTE — Progress Notes (Unsigned)
 Robert Park

## 2024-01-04 ENCOUNTER — Other Ambulatory Visit (HOSPITAL_COMMUNITY): Payer: Self-pay

## 2024-01-04 ENCOUNTER — Ambulatory Visit: Admitting: Infectious Disease

## 2024-01-04 ENCOUNTER — Other Ambulatory Visit: Payer: Self-pay

## 2024-01-04 ENCOUNTER — Encounter: Payer: Self-pay | Admitting: Infectious Disease

## 2024-01-04 VITALS — BP 157/98 | HR 84 | Temp 97.0°F

## 2024-01-04 DIAGNOSIS — Z113 Encounter for screening for infections with a predominantly sexual mode of transmission: Secondary | ICD-10-CM

## 2024-01-04 DIAGNOSIS — B20 Human immunodeficiency virus [HIV] disease: Secondary | ICD-10-CM

## 2024-01-04 DIAGNOSIS — Z9889 Other specified postprocedural states: Secondary | ICD-10-CM

## 2024-01-04 DIAGNOSIS — Z7185 Encounter for immunization safety counseling: Secondary | ICD-10-CM

## 2024-01-04 MED ORDER — CABOTEGRAVIR & RILPIVIRINE ER 600 & 900 MG/3ML IM SUER
1.0000 | Freq: Once | INTRAMUSCULAR | Status: AC
  Administered 2024-01-04: 1 via INTRAMUSCULAR

## 2024-01-04 MED ORDER — CABOTEGRAVIR & RILPIVIRINE ER 400 & 600 MG/2ML IM SUER: 1.0000 | Freq: Once | INTRAMUSCULAR | Status: DC

## 2024-01-04 NOTE — Progress Notes (Signed)
 "  Subjective:  Chief complaint: follow-up for HIV disease on medications   Patient ID: Robert Park, male    DOB: 1950/05/09, 73 y.o.   MRN: [id number]  HPI  Discussed the use of AI scribe software for clinical note transcription with the patient, who gave verbal consent to proceed.  History of Present Illness   Robert Park is a 73 year old male with HIV who presents for a Cabenuva  injection.  He has been on Cabenuva  for over two years, initially receiving it monthly and now every two months for over a year. Initially, he experienced some stiffness in the middle of his back for a couple of hours after the injection, but now he only feels it for a short period and it resolves quickly. He describes Cabenuva  as 'the best thing'.  He has a history of HIV since 2010 and has been on various antiretroviral therapies including Atripla and Genvoya  before starting Cabenuva . Atripla caused issues, but Genvoya  did not bother him. His CD4 count was initially 450.  He underwent bone replacement surgery with a new titanium socket eight weeks ago due to arthritis. He is recovering well and plans to return to work in about a month. 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  He has received six COVID-19 vaccines and contracted COVID-19 three years ago and again three months ago, both times experiencing mild symptoms. He has not had a flu shot in the past year.       Past Medical History:  Diagnosis Date   Allergy    Anxiety    Arthritis    Asthma    Cataract    Bilateral eyes   CMV colitis (HCC)    Depression    Diverticulosis    Dyslipidemia    ED (erectile dysfunction)    GERD (gastroesophageal reflux disease)    History of colon polyps    HIV infection (HCC)    Hyperlipidemia    Hypertension    Internal hemorrhoids    Osteopenia    Shingles    Sleep apnea    does not wear a c-pap   Ulcerative colitis (HCC)    Vaccine  counseling 01/04/2024   Vitamin D  deficiency     Past Surgical History:  Procedure Laterality Date   CARPAL TUNNEL RELEASE Right    x 2   CARPAL TUNNEL RELEASE Left    x 2   CATARACT EXTRACTION, BILATERAL Bilateral 10/2017   COLONOSCOPY     HEMORRHOID SURGERY     MOUTH SURGERY     TONSILLECTOMY AND ADENOIDECTOMY     age 39   UPPER GASTROINTESTINAL ENDOSCOPY     VASECTOMY      Family History  Problem Relation Age of Onset   Colon cancer Mother 72   Heart failure Mother    Cervical cancer Mother    Leukemia Mother    Diabetes Mother    Lung cancer Father    Diabetes Sister    Diverticulitis Brother    Diabetes Maternal Grandmother    Diabetes Brother    Hypertension Brother    Heart disease Brother    Hypertension Sister    Kidney disease Sister    Post-traumatic stress disorder Son    Hypertension Son    Hyperlipidemia Son    Anxiety disorder Son    Esophageal cancer Neg Hx    Rectal cancer Neg Hx    Stomach cancer Neg  Hx       Social History   Socioeconomic History   Marital status: Divorced    Spouse name: Not on file   Number of children: 2   Years of education: Not on file   Highest education level: Not on file  Occupational History   Occupation: Building surveyor    Comment: Disabled   Occupation: Copywriter, Advertising: [employer]    Comment: PT  Tobacco Use   Smoking status: Former    Current packs/day: 0.00    Types: Cigarettes    Quit date: 06/06/1987    Years since quitting: 36.6   Smokeless tobacco: Never   Tobacco comments:    quit 34 years ago  Vaping Use   Vaping status: Never Used  Substance and Sexual Activity   Alcohol use: Yes    Comment: every other day -social   Drug use: Not Currently    Types: Marijuana    Comment: occassional usage - pt states last smoked 35 years ago   Sexual activity: Yes    Partners: Male    Comment: declined condoms  Other Topics Concern   Not on file  Social History Narrative   Current Social  History 05/16/2020        Patient lives with house mate in a two level townhome with handrails inside and 1-2 outside steps without handrails       Patient's method of transportation is personal car.      The highest level of education was 2 years college.      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       FREDRIK Alcide, BSN, RN-BC       Social Drivers of Health   Tobacco Use: Medium Risk (09/17/2023)   Patient History    Smoking Tobacco Use: Former    Smokeless Tobacco Use: Never    Passive Exposure: Not on Actuary Strain: Low Risk (05/27/2021)   Overall Financial Resource Strain (CARDIA)    Difficulty of Paying Living Expenses: Not hard at all  Food Insecurity: No Food Insecurity (08/20/2021)   Hunger Vital Sign    Worried About Running Out of Food in the Last Year: Never true    Ran Out of Food in the Last Year: Never true  Transportation Needs: No Transportation Needs (05/27/2021)   PRAPARE - Administrator, Civil Service (Medical): No    Lack of Transportation (Non-Medical): No  Physical Activity: Insufficiently Active (05/27/2021)   Exercise Vital Sign    Days of Exercise per Week: 2 days    Minutes of Exercise per Session: 60 min  Stress: No Stress Concern Present (05/27/2021)   [employer] of Occupational Health - Occupational Stress Questionnaire    Feeling of Stress : Not at all  Social Connections: Unknown (05/27/2021)   Social Connection and Isolation Panel    Frequency of Communication with Friends and Family: Twice a week    Frequency of  Social Gatherings with Friends and Family: Not on file    Attends Religious Services: Never    Active Member of Clubs or Organizations: No    Attends Banker Meetings: Never    Marital Status: Divorced  Depression (PHQ2-9): Low Risk (03/12/2023)   Depression (PHQ2-9)    PHQ-2 Score: 0  Alcohol Screen: Low Risk (05/27/2021)   Alcohol Screen    Last Alcohol Screening Score (AUDIT): 2  Housing: Low Risk (08/20/2021)   Housing    Last Housing Risk Score: 0  Utilities: Not on file  Health Literacy: Not on file    Allergies[1]  Current Medications[2]   Review of Systems  Constitutional:  Negative for activity change, appetite change, chills, diaphoresis, fatigue, fever and unexpected weight change.  HENT:  Negative for congestion, rhinorrhea, sinus pressure, sneezing, sore throat and trouble swallowing.   Eyes:  Negative for photophobia and visual disturbance.  Respiratory:  Negative for cough, chest tightness, shortness of breath, wheezing and stridor.   Cardiovascular:  Negative for chest pain, palpitations and leg swelling.  Gastrointestinal:  Negative for abdominal distention, abdominal pain, anal bleeding, blood in stool, constipation, diarrhea, nausea and vomiting.  Genitourinary:  Negative for difficulty urinating, dysuria, flank pain and hematuria.  Musculoskeletal:  Negative for arthralgias, back pain, gait problem, joint swelling and myalgias.  Skin:  Negative for color change, pallor, rash and wound.  Neurological:  Negative for dizziness, tremors, weakness and light-headedness.  Hematological:  Negative for adenopathy. Does not bruise/bleed easily.  Psychiatric/Behavioral:  Negative for agitation, behavioral problems, confusion, decreased concentration, dysphoric mood and sleep disturbance.        Objective:   Physical Exam Constitutional:      Appearance: He is well-developed.  HENT:     Head: Normocephalic and atraumatic.  Eyes:     Conjunctiva/sclera:  Conjunctivae normal.  Cardiovascular:     Rate and Rhythm: Normal rate and regular rhythm.  Pulmonary:     Effort: Pulmonary effort is normal. No respiratory distress.     Breath sounds: No wheezing.  Abdominal:     General: There is no distension.     Palpations: Abdomen is soft.  Musculoskeletal:        General: No tenderness. Normal range of motion.     Cervical back: Normal range of motion and neck supple.  Skin:    General: Skin is warm and dry.     Coloration: Skin is not pale.     Findings: No erythema or rash.  Neurological:     General: No focal deficit present.     Mental Status: He is alert and oriented to person, place, and time.  Psychiatric:        Mood and Affect: Mood normal.        Behavior: Behavior normal.        Thought Content: Thought content normal.        Judgment: Judgment normal.           Assessment & Plan:   Assessment and Plan    Human immunodeficiency virus (HIV) disease HIV well-controlled with Cabenuva . No significant side effects or viral load issues. Candidate for extended dosing interval studies. - Continue Cabenuva  injections every two months. - Ordered viral load test, CD4 and routine labs   STi testing:  - Ordered comprehensive STI panel including swabs.  Immunization counseling and administration Due for seventh COVID-19 vaccine. Uncertain flu vaccination status. - Administered COVID-19 vaccine. - Checked flu vaccination status and administered  if not documented.   hx of hand surgery: recovering well          [1]  Allergies Allergen Reactions   Elemental Sulfur Hives, Shortness Of Breath and Swelling   Prednisone Other (See Comments)    Severe colitis   Sulfamethoxazole-Trimethoprim Hives   Statins Other (See Comments)    Muscle pains, GI symptoms  [2]  Current Outpatient Medications:    Adalimumab  (HUMIRA ) 40 MG/0.4ML PSKT, Inject 1 pen  into the skin every 14 (fourteen) days., Disp: 2 each, Rfl: 11    albuterol  (VENTOLIN  HFA) 108 (90 Base) MCG/ACT inhaler, INHALE 1 PUFF INTO THE LUNGS EVERY 6 HOURS AS NEEDED FOR WHEEZING OR SHORTNESS OF BREATH, Disp: 6.7 g, Rfl: 1   cabotegravir  & rilpivirine  ER (CABENUVA ) 600 & 900 MG/3ML injection, Inject 1 kit into the muscle every 2 (two) months., Disp: 6 mL, Rfl: 5   DULoxetine  (CYMBALTA ) 30 MG capsule, Take 1 capsule (30 mg total) by mouth daily., Disp: 90 capsule, Rfl: 3   empagliflozin  (JARDIANCE ) 10 MG TABS tablet, Take 1 tablet (10 mg total) by mouth daily., Disp: 90 tablet, Rfl: 3   ezetimibe  (ZETIA ) 10 MG tablet, Take 1 tablet (10 mg total) by mouth daily., Disp: 90 tablet, Rfl: 3   fluticasone  (FLONASE ) 50 MCG/ACT nasal spray, Place 1 spray into both nostrils daily., Disp: 48 g, Rfl: 3   fluticasone  (FLOVENT  HFA) 110 MCG/ACT inhaler, Inhale 2 puffs into the lungs in the morning and at bedtime., Disp: 3 each, Rfl: 3   losartan  (COZAAR ) 50 MG tablet, Take 1 tablet (50 mg total) by mouth daily., Disp: 90 tablet, Rfl: 3   Olopatadine  HCl 0.6 % SOLN, Place 1 spray into the nose daily., Disp: 93 g, Rfl: 3   tadalafil  (CIALIS ) 10 MG tablet, Take 0.5 tablets (5 mg total) by mouth daily as needed for erectile dysfunction., Disp: 20 tablet, Rfl: 2   Vitamin D , Ergocalciferol , (DRISDOL ) 1.25 MG (50000 UNIT) CAPS capsule, TAKE 1 CAPSULE BY MOUTH EVERY 7 DAYS, Disp: 12 capsule, Rfl: 0  Current Facility-Administered Medications:    cabotegravir  & rilpivirine  ER (CABENUVA ) 400 & 600 MG/2ML injection 1 kit, 1 kit, Intramuscular, Once,   "

## 2024-01-05 LAB — CT/NG RNA, TMA RECTAL
Chlamydia Trachomatis RNA: NOT DETECTED
Neisseria Gonorrhoeae RNA: NOT DETECTED

## 2024-01-05 LAB — C. TRACHOMATIS/N. GONORRHOEAE RNA
C. trachomatis RNA, TMA: NOT DETECTED
N. gonorrhoeae RNA, TMA: NOT DETECTED

## 2024-01-05 LAB — GC/CHLAMYDIA PROBE, AMP (THROAT)
Chlamydia trachomatis RNA: NOT DETECTED
Neisseria gonorrhoeae RNA: NOT DETECTED

## 2024-01-07 ENCOUNTER — Other Ambulatory Visit: Payer: Self-pay

## 2024-01-08 LAB — COMPLETE METABOLIC PANEL WITHOUT GFR
AG Ratio: 1.7 (calc) (ref 1.0–2.5)
ALT: 22 U/L (ref 9–46)
AST: 16 U/L (ref 10–35)
Albumin: 4.5 g/dL (ref 3.6–5.1)
Alkaline phosphatase (APISO): 86 U/L (ref 35–144)
BUN: 10 mg/dL (ref 7–25)
CO2: 28 mmol/L (ref 20–32)
Calcium: 9.8 mg/dL (ref 8.6–10.3)
Chloride: 98 mmol/L (ref 98–110)
Creat: 0.77 mg/dL (ref 0.70–1.28)
Globulin: 2.7 g/dL (ref 1.9–3.7)
Glucose, Bld: 172 mg/dL — ABNORMAL HIGH (ref 65–99)
Potassium: 4.5 mmol/L (ref 3.5–5.3)
Sodium: 135 mmol/L (ref 135–146)
Total Bilirubin: 0.5 mg/dL (ref 0.2–1.2)
Total Protein: 7.2 g/dL (ref 6.1–8.1)

## 2024-01-08 LAB — CBC WITH DIFFERENTIAL/PLATELET
Absolute Lymphocytes: 2878 {cells}/uL (ref 850–3900)
Absolute Monocytes: 730 {cells}/uL (ref 200–950)
Basophils Absolute: 70 {cells}/uL (ref 0–200)
Basophils Relative: 0.8 %
Eosinophils Absolute: 167 {cells}/uL (ref 15–500)
Eosinophils Relative: 1.9 %
HCT: 49.2 % (ref 39.4–51.1)
Hemoglobin: 16.2 g/dL (ref 13.2–17.1)
MCH: 30.2 pg (ref 27.0–33.0)
MCHC: 32.9 g/dL (ref 31.6–35.4)
MCV: 91.6 fL (ref 81.4–101.7)
MPV: 10.6 fL (ref 7.5–12.5)
Monocytes Relative: 8.3 %
Neutro Abs: 4954 {cells}/uL (ref 1500–7800)
Neutrophils Relative %: 56.3 %
Platelets: 257 Thousand/uL (ref 140–400)
RBC: 5.37 Million/uL (ref 4.20–5.80)
RDW: 12.8 % (ref 11.0–15.0)
Total Lymphocyte: 32.7 %
WBC: 8.8 Thousand/uL (ref 3.8–10.8)

## 2024-01-08 LAB — T-HELPER CELLS (CD4) COUNT (NOT AT ARMC)
Absolute CD4: 1166 {cells}/uL (ref 490–1740)
CD4 T Helper %: 38 % (ref 30–61)
Total lymphocyte count: 3043 {cells}/uL (ref 850–3900)

## 2024-01-08 LAB — LIPID PANEL
Cholesterol: 250 mg/dL — ABNORMAL HIGH
HDL: 59 mg/dL
LDL Cholesterol (Calc): 144 mg/dL — ABNORMAL HIGH
Non-HDL Cholesterol (Calc): 191 mg/dL — ABNORMAL HIGH
Total CHOL/HDL Ratio: 4.2 (calc)
Triglycerides: 284 mg/dL — ABNORMAL HIGH

## 2024-01-08 LAB — TEST AUTHORIZATION: TEST NAME:: 7600

## 2024-01-08 LAB — RPR TITER: RPR Titer: 1:1 {titer} — ABNORMAL HIGH

## 2024-01-08 LAB — T PALLIDUM AB: T Pallidum Abs: POSITIVE — AB

## 2024-01-08 LAB — SYPHILIS: RPR W/REFLEX TO RPR TITER AND TREPONEMAL ANTIBODIES, TRADITIONAL SCREENING AND DIAGNOSIS ALGORITHM: RPR Ser Ql: REACTIVE — AB

## 2024-01-08 LAB — HIV-1 RNA QUANT-NO REFLEX-BLD
HIV 1 RNA Quant: NOT DETECTED {copies}/mL
HIV-1 RNA Quant, Log: NOT DETECTED {Log_copies}/mL

## 2024-01-10 ENCOUNTER — Telehealth: Payer: Self-pay

## 2024-01-10 NOTE — Telephone Encounter (Signed)
 [facility] Patient Advocate Encounter  Patient's medications CABENUVA  have been couriered to [facility] from [facility] and will be administered at the patients appointment on 03/06/24.  Charmaine Sharps, CPhT Specialty Pharmacy Patient [facility] Phone: [phone] Fax:  [phone]

## 2024-01-11 ENCOUNTER — Other Ambulatory Visit (HOSPITAL_COMMUNITY): Payer: Self-pay

## 2024-02-02 ENCOUNTER — Other Ambulatory Visit: Payer: Self-pay | Admitting: Student in an Organized Health Care Education/Training Program

## 2024-02-03 NOTE — Telephone Encounter (Signed)
 Okay to refill.

## 2024-03-06 ENCOUNTER — Encounter: Payer: Self-pay | Admitting: Pharmacist
